# Patient Record
Sex: Male | Born: 1965
Health system: Southern US, Community
[De-identification: ages and names within clinical notes are randomized; demographics above are authoritative.]

## PROBLEM LIST (undated history)

## (undated) DIAGNOSIS — R519 Headache, unspecified: Secondary | ICD-10-CM

## (undated) DIAGNOSIS — E039 Hypothyroidism, unspecified: Secondary | ICD-10-CM

## (undated) DIAGNOSIS — G4733 Obstructive sleep apnea (adult) (pediatric): Secondary | ICD-10-CM

## (undated) DIAGNOSIS — J189 Pneumonia, unspecified organism: Secondary | ICD-10-CM

## (undated) DIAGNOSIS — N189 Chronic kidney disease, unspecified: Secondary | ICD-10-CM

## (undated) DIAGNOSIS — E0789 Other specified disorders of thyroid: Secondary | ICD-10-CM

## (undated) DIAGNOSIS — G932 Benign intracranial hypertension: Secondary | ICD-10-CM

## (undated) DIAGNOSIS — Z9989 Dependence on other enabling machines and devices: Secondary | ICD-10-CM

## (undated) DIAGNOSIS — D573 Sickle-cell trait: Secondary | ICD-10-CM

## (undated) DIAGNOSIS — H4710 Unspecified papilledema: Secondary | ICD-10-CM

## (undated) DIAGNOSIS — R55 Syncope and collapse: Principal | ICD-10-CM

## (undated) HISTORY — DX: Sickle-cell trait: D57.3

## (undated) HISTORY — DX: Pneumonia, unspecified organism: J18.9

## (undated) HISTORY — DX: Benign intracranial hypertension: G93.2

## (undated) HISTORY — DX: Dependence on other enabling machines and devices: Z99.89

## (undated) HISTORY — DX: Unspecified papilledema: H47.10

## (undated) HISTORY — DX: Syncope and collapse: R55

## (undated) HISTORY — DX: Morbid (severe) obesity due to excess calories: E66.01

## (undated) HISTORY — DX: Hypothyroidism, unspecified: E03.9

## (undated) HISTORY — DX: Obstructive sleep apnea (adult) (pediatric): G47.33

## (undated) HISTORY — DX: Other specified disorders of thyroid: E07.89

---
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
## 1997-05-27 ENCOUNTER — Emergency Department (HOSPITAL_COMMUNITY): Admission: EM | Admit: 1997-05-27 | Discharge: 1997-05-27 | Payer: Self-pay | Admitting: Emergency Medicine

## 1998-09-15 ENCOUNTER — Inpatient Hospital Stay (HOSPITAL_COMMUNITY): Admission: EM | Admit: 1998-09-15 | Discharge: 1998-09-16 | Payer: Self-pay | Admitting: Emergency Medicine

## 1998-10-03 ENCOUNTER — Encounter (INDEPENDENT_AMBULATORY_CARE_PROVIDER_SITE_OTHER): Payer: Self-pay | Admitting: Specialist

## 1998-10-03 ENCOUNTER — Other Ambulatory Visit: Admission: RE | Admit: 1998-10-03 | Discharge: 1998-10-03 | Payer: Self-pay | Admitting: Otolaryngology

## 1999-01-19 HISTORY — PX: CYST EXCISION: SHX5701

## 1999-06-25 ENCOUNTER — Encounter: Payer: Self-pay | Admitting: Emergency Medicine

## 1999-06-25 ENCOUNTER — Inpatient Hospital Stay (HOSPITAL_COMMUNITY): Admission: AD | Admit: 1999-06-25 | Discharge: 1999-06-27 | Payer: Self-pay | Admitting: Emergency Medicine

## 1999-07-13 ENCOUNTER — Ambulatory Visit (HOSPITAL_COMMUNITY): Admission: RE | Admit: 1999-07-13 | Discharge: 1999-07-13 | Payer: Self-pay | Admitting: Emergency Medicine

## 1999-08-04 ENCOUNTER — Ambulatory Visit (HOSPITAL_COMMUNITY): Admission: RE | Admit: 1999-08-04 | Discharge: 1999-08-04 | Payer: Self-pay | Admitting: Pulmonary Disease

## 1999-08-04 ENCOUNTER — Encounter: Payer: Self-pay | Admitting: Pulmonary Disease

## 1999-09-12 ENCOUNTER — Ambulatory Visit (HOSPITAL_BASED_OUTPATIENT_CLINIC_OR_DEPARTMENT_OTHER): Admission: RE | Admit: 1999-09-12 | Discharge: 1999-09-12 | Payer: Self-pay | Admitting: Pulmonary Disease

## 1999-11-27 ENCOUNTER — Encounter: Payer: Self-pay | Admitting: Emergency Medicine

## 1999-11-27 ENCOUNTER — Encounter: Admission: RE | Admit: 1999-11-27 | Discharge: 1999-11-27 | Payer: Self-pay | Admitting: Emergency Medicine

## 2001-02-13 ENCOUNTER — Ambulatory Visit (HOSPITAL_COMMUNITY): Admission: RE | Admit: 2001-02-13 | Discharge: 2001-02-13 | Payer: Self-pay | Admitting: *Deleted

## 2003-03-12 ENCOUNTER — Encounter: Admission: RE | Admit: 2003-03-12 | Discharge: 2003-03-12 | Payer: Self-pay | Admitting: Internal Medicine

## 2003-10-30 ENCOUNTER — Encounter (INDEPENDENT_AMBULATORY_CARE_PROVIDER_SITE_OTHER): Payer: Self-pay | Admitting: Specialist

## 2003-10-30 ENCOUNTER — Ambulatory Visit (HOSPITAL_COMMUNITY): Admission: RE | Admit: 2003-10-30 | Discharge: 2003-10-30 | Payer: Self-pay | Admitting: Gastroenterology

## 2003-11-04 ENCOUNTER — Ambulatory Visit (HOSPITAL_COMMUNITY): Admission: RE | Admit: 2003-11-04 | Discharge: 2003-11-04 | Payer: Self-pay | Admitting: Gastroenterology

## 2003-12-24 ENCOUNTER — Encounter: Admission: RE | Admit: 2003-12-24 | Discharge: 2003-12-24 | Payer: Self-pay | Admitting: Internal Medicine

## 2008-11-27 ENCOUNTER — Encounter: Admission: RE | Admit: 2008-11-27 | Discharge: 2008-11-27 | Payer: Self-pay | Admitting: Neurology

## 2008-12-04 ENCOUNTER — Encounter: Admission: RE | Admit: 2008-12-04 | Discharge: 2008-12-04 | Payer: Self-pay | Admitting: Neurology

## 2010-06-05 NOTE — H&P (Signed)
[place]. [facility]  Patient:    Carl Knox, Carl Knox                         MRN: [id number] Adm. Date:  06/26/99 Attending:  Reuben Likes, M.D. Dictator:   Reuben Likes, M.D.                         History and Physical  CHIEF COMPLAINT:  "Right chest pain".  HISTORY OF PRESENT ILLNESS:  The patient is a 45 year old male who has a two week history of pleuritic right lateral chest pain which radiates to the right lower chest anteriorly and posteriorly.  When the symptoms first began the patient had a temperature of 103, but none since then.  He denies cough, sputum production, dyspnea, nausea, or vomiting.  A chest x-ray the day before admission in our office showed a small area of atelectasis versus pneumonia at the right base.  He was given Claforan 1 g IM plus Levaquin 500 mg p.o. once a day, but today feels worse, and is admitted for further evaluation and treatment.  PAST SURGICAL HISTORY:  Excision of a thyroglossal duct cyst in September 2000.  OTHER HOSPITALIZATIONS:  None.  OTHER ILLNESSES:  Obstructive sleep apnea and uses a CPAP machine.  This was diagnosed in 1999.  He had pneumonia in 1999.  MEDICATIONS:  As above.  ALLERGIES:  No known drug allergies.  FAMILY HISTORY:  His mother has hypertension and heart problems.  His father is alive and well.  He has a brother who is alive and well, and two children, one of whom has sickle cell trait.  SOCIAL HISTORY:  He is married.  He works as a Surveyor, quantity.  He does not use alcohol or tobacco.  REVIEW OF SYSTEMS:  No other systemic, skin, eye, ENT, respiratory, cardiovascular, gastrointestinal, GU, musculoskeletal, neurological, or psychiatric complaints.  PHYSICAL EXAMINATION:  VITAL SIGNS:  Blood pressure 178/71, pulse 77 and regular, respirations 20, temperature 99.0.  GENERAL:  Alert and in no distress.  SKIN:  Warm and dry, no rash.  EYES:  Pupils are equal,  round and reactive to light.  Full EOMs.  Fundi benign.  Sclerae nonicteric.  ENT:  Tympanic membranes normal.  No intraoral lesions.  Mucus membranes moist.  Pharynx clear.  NECK:  Supple, no adenopathy, JVD, or bruit.  LUNGS:  There were decreased breath sounds of the right base, no rales.  Lungs were resonant to percussion.  HEART:  Regular rhythm, no murmurs, rubs, or gallops.  ABDOMEN:  The patient has moderate right upper quadrant tenderness to palpation without guarding or rebound.  No hepatosplenomegaly or mass.  Bowel sounds were diminished.  He has a positive Murphys sign, but negative Murphys punch.  EXTREMITIES:  No calf tenderness, Homans sign negative, no edema, pulses full.  NEUROLOGIC:  Alert and oriented x 3.  Speech is clear and appropriate.  No extremity weakness or tremor.  Deep tendon reflexes 2+ and symmetrical. Babinski is downgoing.  Cranial nerves intact.  ADMITTING IMPRESSION:  Pleuritic right chest pain, possibly pneumonia, pulmonary embolus, or gallstones.  PLAN: 1. Repeat chest x-ray, spiral chest CT, and gallbladder ultrasound. 2. Continue intravenous Levaquin. DD:  06/26/99 TD:  06/26/99 Job: [id number]

## 2010-06-05 NOTE — Op Note (Signed)
NAME:  Carl Knox, Carl Knox NO.:  [id number]   MEDICAL RECORD NO.:  [id number]          PATIENT TYPE:  AMB   LOCATION:  ENDO                         FACILITY:  [facility]   PHYSICIAN:  Anselmo Rod, M.D.  DATE OF BIRTH:  1965-10-31   DATE OF PROCEDURE:  10/30/2003  DATE OF DISCHARGE:                                 OPERATIVE REPORT   PROCEDURE PERFORMED:  Esophagogastroduodenoscopy with antral biopsies.   ENDOSCOPIST:  Anselmo Rod, M.D.   INSTRUMENT USED:  Olympus video panendoscope.   INDICATION FOR PROCEDURE:  A 45 year old African-American male with a  history of reflux, epigastric discomfort, wheezing, and asthma.  Rule out  peptic esophagitis, stricture, ulcer disease, etc.   PREPROCEDURE PREPARATION:  Informed consent was procured from the patient.  The patient had fasted for eight hours prior to the procedure.   PREPROCEDURE PHYSICAL:  VITAL SIGNS:  The patient had stable vital signs.  NECK:  Supple.  CHEST:  Clear to auscultation.  S1, S2 regular.  ABDOMEN:  Soft with normal bowel sounds.  The patient has a history of  obstructive sleep apnea.   DESCRIPTION OF PROCEDURE:  The patient was placed in the left lateral  decubitus position and sedated with 75 mg of Demerol and 7 mg of Versed in  slow incremental doses.  Once the patient was adequately sedate and  maintained on low-flow oxygen and continuous cardiac monitoring, the Olympus  video panendoscope was advanced through the mouthpiece, over the tongue, and  into the esophagus under direct vision.  The proximal esophagus appeared  normal.  There was grade 1 distal esophagitis around the Z-line.  The scope  was then advanced in the stomach.  There was moderate diffuse gastritis  throughout the gastric mucosa.  Biopsies were done to rule out presence of  H. pylori by pathology.  Retroflexion in the high cardia revealed no  abnormalities.  No ulcers, erosions, masses, or polyps were seen, and the  proximal small bowel appeared normal.  There was no outlet obstruction.  The  patient tolerated the procedure well without immediate complications.   IMPRESSION:  1.  Grade 1 distal esophagitis.  2.  Diffuse gastritis, biopsies done for Helicobacter pylori by pathology.  3.  Normal proximal small bowel.  4.  No ulcers, erosions, masses, or polyps seen.   RECOMMENDATIONS:  1.  Await pathology results.  2.  Avoid nonsteroidals including aspirin for now.  3.  Continue PPIs.  4.  Outpatient follow-up in the next two weeks for further recommendations.       JNM/MEDQ  D:  10/30/2003  T:  10/30/2003  Job:  [id number]   cc:   Olene Craven, M.D.  [address]  [address]  [place]  [place] [address]  Fax: [phone]   Marcelyn Bruins, M.D. [facility]   Leslye Peer Gerilyn Pilgrim, MD  [phone] [address] [place]  [place] [address]  Fax: [phone]

## 2012-12-25 ENCOUNTER — Ambulatory Visit: Payer: Self-pay | Admitting: Dietician

## 2013-02-02 ENCOUNTER — Ambulatory Visit: Payer: Self-pay | Admitting: Dietician

## 2015-03-20 DIAGNOSIS — J3489 Other specified disorders of nose and nasal sinuses: Secondary | ICD-10-CM | POA: Diagnosis not present

## 2015-03-20 MED FILL — MUPIROCIN 2% OINTMENT: 2 | 10 days supply | Qty: 22 | Fill #0

## 2015-06-29 DIAGNOSIS — M25571 Pain in right ankle and joints of right foot: Secondary | ICD-10-CM | POA: Diagnosis not present

## 2015-06-29 DIAGNOSIS — S90122A Contusion of left lesser toe(s) without damage to nail, initial encounter: Secondary | ICD-10-CM | POA: Diagnosis not present

## 2015-06-29 DIAGNOSIS — M79676 Pain in unspecified toe(s): Secondary | ICD-10-CM | POA: Diagnosis not present

## 2015-06-29 DIAGNOSIS — W108XXA Fall (on) (from) other stairs and steps, initial encounter: Secondary | ICD-10-CM | POA: Diagnosis not present

## 2015-06-29 DIAGNOSIS — S93401A Sprain of unspecified ligament of right ankle, initial encounter: Secondary | ICD-10-CM | POA: Diagnosis not present

## 2015-07-01 DIAGNOSIS — S93491A Sprain of other ligament of right ankle, initial encounter: Secondary | ICD-10-CM | POA: Diagnosis not present

## 2015-07-08 ENCOUNTER — Encounter: Payer: Self-pay | Admitting: Physical Therapy

## 2015-07-08 ENCOUNTER — Ambulatory Visit: Payer: 59 | Attending: Orthopedic Surgery | Admitting: Physical Therapy

## 2015-07-08 DIAGNOSIS — R600 Localized edema: Secondary | ICD-10-CM | POA: Insufficient documentation

## 2015-07-08 DIAGNOSIS — M6281 Muscle weakness (generalized): Secondary | ICD-10-CM | POA: Diagnosis not present

## 2015-07-08 DIAGNOSIS — R262 Difficulty in walking, not elsewhere classified: Secondary | ICD-10-CM | POA: Diagnosis not present

## 2015-07-08 DIAGNOSIS — M25671 Stiffness of right ankle, not elsewhere classified: Secondary | ICD-10-CM | POA: Diagnosis not present

## 2015-07-08 DIAGNOSIS — M25571 Pain in right ankle and joints of right foot: Secondary | ICD-10-CM | POA: Diagnosis not present

## 2015-07-08 NOTE — Therapy (Signed)
[place], [place], [address] Phone: [phone]   Fax:  [phone]  Physical Therapy Evaluation  Patient Details  Name: Carl Knox MRN: [id number] Date of Birth: [birth year] Referring Provider: Dr Elsie Saas   Encounter Date: 07/08/2015      PT End of Session - 07/08/15 1122    Visit Number 1   Number of Visits 16   Date for PT Re-Evaluation 09/02/15   Authorization Type [facility]    PT Start Time 0800   PT Stop Time 0853   PT Time Calculation (min) 53 min   Activity Tolerance Patient tolerated treatment well   Behavior During Therapy [facility] for tasks assessed/performed      History reviewed. No pertinent past medical history.  History reviewed. No pertinent past surgical history.  There were no vitals filed for this visit.       Subjective Assessment - 07/08/15 0809    Subjective Patient was walking down the stairs on 06/29/15 when he fell and sprained his right ankle. He also badly bruised his big toe on the left foot.    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    Limitations Walking;Standing   How long can you sit comfortably? N/A    How long can you stand comfortably? > 20 minutes    How long can you walk comfortably? Household distances    Diagnostic tests Nothing in the system: perpatient x-rays (-)    Patient Stated Goals To get back to work    Currently in Pain? Yes   Pain Score 2    Pain Location Ankle   Pain Descriptors / Indicators Aching   Pain Onset 1 to 4 weeks ago   Pain Frequency Intermittent   Aggravating Factors  walking    Pain Relieving Factors rest and ice    Effect of Pain on Daily Activities difficulty walking/ unable to work    Multiple Pain Sites No            [facility] PT Assessment - 07/08/15 0001    Assessment   Medical Diagnosis right ankle sprain    Referring Provider Dr Elsie Saas    Onset  Date/Surgical Date 06/29/15   Hand Dominance Right   Next MD Visit 07/11/2015   Prior Therapy No   Precautions   Precautions None   Required Braces or Orthoses --  Cam walker boot    Restrictions   Weight Bearing Restrictions No   Balance Screen   Has the patient fallen in the past 6 months Yes   How many times? 1  only fall the patient has had   Home Environment   Additional Comments 16 steps to get in the house.    Prior Function   Level of Independence Independent   Cognition   Overall Cognitive Status Within Functional Limits for tasks assessed   Observation/Other Assessments   Observations Bilateral flat foot   Observation/Other Assessments-Edema    Edema Figure 8   Figure 8 Edema   Figure 8 - Right  63.5   Figure 8 - Left  61.6   Sensation   Light Touch Appears Intact   ROM / Strength   AROM / PROM / Strength AROM;PROM;Strength   AROM   AROM Assessment Site Ankle   Right/Left Ankle Left;Right   Right Ankle Dorsiflexion -8   Right Ankle Plantar Flexion 36   Right Ankle Inversion 20  Right Ankle Eversion 8   PROM   PROM Assessment Site Ankle   Right/Left Ankle Left;Right   Right Ankle Dorsiflexion -4   Right Ankle Plantar Flexion 40   Right Ankle Inversion 28   Right Ankle Eversion 13   Left Ankle Dorsiflexion --  WFL   Left Ankle Plantar Flexion --  WFL   Left Ankle Inversion --  WFL   Left Ankle Eversion --  [facility]   Strength   Strength Assessment Site Ankle   Right/Left Ankle Left;Right   Right Ankle Dorsiflexion 4+/5   Right Ankle Plantar Flexion 3/5   Right Ankle Inversion 5/5   Right Ankle Eversion 4+/5   Palpation   Palpation comment Tenderness to aplapation around the ATFL and the distal lateral achillies insertion    Ambulation/Gait   Gait Comments Ambualtes with the boot on. Decreased single leg stance on the right.                    [facility] Adult PT Treatment/Exercise - 07/08/15 0001    Self-Care   Self-Care Other Self-Care  Comments   Other Self-Care Comments  Educated the patient on symptom management and activity progression at home.    Ankle Exercises: Stretches   Gastroc Stretch Limitations seated with strap 3x30sec hold    Ankle Exercises: Seated   Other Seated Ankle Exercises Ankle 4 way t-band 20x w/ green    Ankle Exercises: Standing   Other Standing Ankle Exercises Standing weight shift 2x10 each way                PT Education - 07/08/15 1121    Education provided Yes   Education Details Patient educated exercise and increasing stability. Patient given HEP. on the improtance of    Person(s) Educated Patient   Methods Explanation;Demonstration   Comprehension Verbalized understanding;Returned demonstration          PT Short Term Goals - 07/08/15 1723    PT SHORT TERM GOAL #1   Title Patient will be I w/ HEp for ankle strength and ROM    Time 4   Period Weeks   Status New   PT SHORT TERM GOAL #2   Title Patient will ambulate 400' without the boot and without pain    Time 4   Period Weeks   Status New   PT SHORT TERM GOAL #3   Title Patient will increase gross left ankle strength to 5/5    Time 4   Period Weeks   Status New   PT SHORT TERM GOAL #4   Title Patient will increase passive dorsiflexion by 10 degrees    Time 4   Period Weeks   Status New           PT Long Term Goals - 07/08/15 1726    PT LONG TERM GOAL #1   Title Patient will increase active dorsiflexion to 15 degrees in order to go up and down stairs comfortably    Time 8   Period Weeks   Status New   PT LONG TERM GOAL #2   Title Patient will ambulate 3000' without pain in order to return to work    Time 8   Period Weeks   Status New   PT LONG TERM GOAL #3   Title Patient will stand for 3 hours without self report of pain in order to return to work.    Time 8   Period Weeks   Status New  Plan - 07/08/15 1717    Clinical Impression Statement Patient is a 50 year old male  S/P left ankle sprain and right toe bruise. He presents with decreased strength and range of motion of the left ankle. 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 He is currently in a boot. He has pain but it is well controlled at this time. He was seen for a low complexity evaluation today. He would benefit from skilled therapy to improve ankle strength and mobility.    Rehab Potential Good   PT Frequency 2x / week   PT Duration 8 weeks   PT Treatment/Interventions ADLs/Self Care Home Management;Cryotherapy;Electrical Stimulation;Therapeutic activities;Iontophoresis 4mg /ml Dexamethasone;Moist Heat;Therapeutic exercise;Functional mobility training;Stair training;Gait training;Ultrasound;Neuromuscular re-education;Patient/family education;Manual techniques;Dry needling;Energy conservation;Taping;Passive range of motion;Compression bandaging;Manual lymph drainage;Visual/perceptual remediation/compensation   PT Next Visit Plan continue manual therapy to increase ankle range of motion. continue standing activity; consider baps board, cybex leg press, rocker board sitting or standing,    PT Home Exercise Plan ankle 4 way t-band, gastroc stretch, standing weight shift forward and back    Consulted and Agree with Plan of Care Patient      Patient will benefit from skilled therapeutic intervention in order to improve the following deficits and impairments:  Abnormal gait, Decreased activity tolerance, Decreased mobility, Decreased strength, Decreased endurance, Difficulty walking, Decreased range of motion, Decreased safety awareness, Impaired flexibility  Visit Diagnosis: Pain in right ankle and joints of right foot - Plan: PT plan of care cert/re-cert  Stiffness of right ankle, not elsewhere classified - Plan: PT plan of care cert/re-cert  Difficulty in walking, not elsewhere classified - Plan: PT plan of care cert/re-cert  Muscle weakness (generalized) - Plan: PT plan  of care cert/re-cert  Localized edema - Plan: PT plan of care cert/re-cert     Problem List There are no active problems to display for this patient.   Carney Living PT DPT  07/08/2015, 5:35 PM  [facility] [address] [place], [place], [address] Phone: [phone]   Fax:  [phone]  Name: Carl Knox MRN: [id number] Date of Birth: 10-20-1965

## 2015-07-10 ENCOUNTER — Encounter: Payer: Self-pay | Admitting: Podiatry

## 2015-07-10 ENCOUNTER — Ambulatory Visit (INDEPENDENT_AMBULATORY_CARE_PROVIDER_SITE_OTHER): Payer: 59 | Admitting: Podiatry

## 2015-07-10 VITALS — BP 124/75 | HR 71 | Resp 14

## 2015-07-10 DIAGNOSIS — W450XXA Nail entering through skin, initial encounter: Secondary | ICD-10-CM

## 2015-07-10 DIAGNOSIS — M79676 Pain in unspecified toe(s): Secondary | ICD-10-CM | POA: Diagnosis not present

## 2015-07-10 DIAGNOSIS — S93491D Sprain of other ligament of right ankle, subsequent encounter: Secondary | ICD-10-CM | POA: Diagnosis not present

## 2015-07-10 DIAGNOSIS — M25562 Pain in left knee: Secondary | ICD-10-CM | POA: Diagnosis not present

## 2015-07-10 NOTE — Progress Notes (Signed)
   Subjective:    Patient ID: Carl Knox, male    DOB: 17-Nov-1965, 50 y.o.   MRN: [id number]  HPI this patient presents the office with chief complaint of drainage coming from the outside of his his big toenail, left foot. He said he fell down a flight of stairs on 06/29/2015. He says the outside of the big toe left foot then became numb. He was over at the orthopedic doctors office this morning for a broken foot and was referred to this office for an examination of the drainage of the outside border left big toe. Patient states there is clear drainage but he is not having much pain or discomfort at this point, he presents the office today for an evaluation and treatment of his left big toenail  Review of Systems  Neurological: Positive for weakness.  All other systems reviewed and are negative.      Objective:   Physical Exam GENERAL APPEARANCE: Alert, conversant. Appropriately groomed. No acute distress.  VASCULAR: Pedal pulses are  palpable at  [facility] and PT bilateral.  Capillary refill time is immediate to all digits,  Normal temperature gradient.  Digital hair growth is present bilateral  NEUROLOGIC: sensation is normal to 5.07 monofilament at 5/5 sites bilateral.  Light touch is intact bilateral, Muscle strength normal.  MUSCULOSKELETAL: acceptable muscle strength, tone and stability bilateral.  Intrinsic muscluature intact bilateral.  Rectus appearance of foot and digits noted bilateral.   DERMATOLOGIC: skin color, texture, and turgor are within normal limits.  No preulcerative lesions or ulcers  are seen, no interdigital maceration noted.  No open lesions present.   NAIL  There is broken nail along the lateral border left great toe.  Removal of nail reveals clear fluid running from under left big toenail. Motion is detected at proximal nail fold left hallux.          Assessment & Plan:  Nail Injury left hallux.  IE  Incision and drainage lateral border left hallux.  Neosporin/DSD.   Home soaks given.  Recommended we allow the toenail declare itself.  RTC prn   Gardiner Barefoot DPM

## 2015-07-15 ENCOUNTER — Ambulatory Visit: Payer: 59 | Admitting: Physical Therapy

## 2015-07-15 DIAGNOSIS — R600 Localized edema: Secondary | ICD-10-CM | POA: Diagnosis not present

## 2015-07-15 DIAGNOSIS — R262 Difficulty in walking, not elsewhere classified: Secondary | ICD-10-CM | POA: Diagnosis not present

## 2015-07-15 DIAGNOSIS — M6281 Muscle weakness (generalized): Secondary | ICD-10-CM

## 2015-07-15 DIAGNOSIS — M25571 Pain in right ankle and joints of right foot: Secondary | ICD-10-CM

## 2015-07-15 DIAGNOSIS — M25671 Stiffness of right ankle, not elsewhere classified: Secondary | ICD-10-CM

## 2015-07-15 NOTE — Therapy (Addendum)
[place], [place], [address] Phone: [phone]   Fax:  [phone]  Physical Therapy Treatment  Patient Details  Name: Carl Knox MRN: [id number] Date of Birth: 10/19/1965 Referring Provider: Dr Elsie Saas   Encounter Date: 07/15/2015      PT End of Session - 07/15/15 0936    Visit Number 2   Number of Visits 16   Date for PT Re-Evaluation 09/02/15   Authorization Type [facility]    PT Start Time 0930   PT Stop Time 1015   PT Time Calculation (min) 45 min      No past medical history on file.  No past surgical history on file.  There were no vitals filed for this visit.      Subjective Assessment - 07/15/15 0931    Subjective Sore after I walk or stand on it awhile cooking for an hour.    Currently in Pain? No/denies            [facility] PT Assessment - 07/15/15 0001    AROM   Right Ankle Dorsiflexion 0   Right Ankle Plantar Flexion 50   Right Ankle Inversion 22   Right Ankle Eversion 18                     [facility] Adult PT Treatment/Exercise - 07/15/15 0001    Ankle Exercises: Seated   BAPS Level 2   Other Seated Ankle Exercises Ankle 4 way t-band 20x w/ green    Ankle Exercises: Stretches   Gastroc Stretch Limitations seated with strap 3x30sec hold    Ankle Exercises: Standing   SLS 5 sec best, used finger touch 30 sec x 2   Heel Raises 10 reps   Toe Raise 10 reps     VASO PNEUMATIC DEVICE LOW PRESSURE 32 DEGREES  RIGHT ANKLE ELEVATED x15 MINUTES 32 DEGREES FOR EDEMA             PT Short Term Goals - 07/08/15 1723    PT SHORT TERM GOAL #1   Title Patient will be I w/ HEp for ankle strength and ROM    Time 4   Period Weeks   Status New   PT SHORT TERM GOAL #2   Title Patient will ambulate 400' without the boot and without pain    Time 4   Period Weeks   Status New   PT SHORT TERM GOAL #3   Title Patient will increase gross left ankle strength to 5/5     Time 4   Period Weeks   Status New   PT SHORT TERM GOAL #4   Title Patient will increase passive dorsiflexion by 10 degrees    Time 4   Period Weeks   Status New           PT Long Term Goals - 07/08/15 1726    PT LONG TERM GOAL #1   Title Patient will increase active dorsiflexion to 15 degrees in order to go up and down stairs comfortably    Time 8   Period Weeks   Status New   PT LONG TERM GOAL #2   Title Patient will ambulate 3000' without pain in order to return to work    Time 8   Period Weeks   Status New   PT LONG TERM GOAL #3   Title Patient will stand for 3 hours without self report of pain in order to return  to work.    Time 8   Period Weeks   Status New               Plan - 07/15/15 0943    Clinical Impression Statement Pt reports he does not wear his boot around the house. He has been given ASO however he does not own any sneakers. He wears no shoes around the house and has been perfroming weight shift and heel raises per HEP without much difficulty. He reports his HEP causes some increased pain. Review of HEP- independent. Added SLS with light UE support to HEP. Encouraged pt not to over do this at home and monitor his edema. Trial of vasopneumatic for edema today. All AROM motions improved.    PT Next Visit Plan continue manual therapy( achilles, calf, lateral ankle ) to increase ankle range of motion. continue standing activity; consider baps board, cybex leg press, rocker board sitting or standing,       Patient will benefit from skilled therapeutic intervention in order to improve the following deficits and impairments:  Abnormal gait, Decreased activity tolerance, Decreased mobility, Decreased strength, Decreased endurance, Difficulty walking, Decreased range of motion, Decreased safety awareness, Impaired flexibility  Visit Diagnosis: Pain in right ankle and joints of right foot  Stiffness of right ankle, not elsewhere classified  Difficulty  in walking, not elsewhere classified  Localized edema  Muscle weakness (generalized)     Problem List There are no active problems to display for this patient.   Hessie Diener [place] , [place]  07/15/2015, 10:24 AM  [place], [place], [address] Phone: [phone]   Fax:  [phone]  Name: Lafayette Meech MRN: [id number] Date of Birth: [birth year]

## 2015-07-18 ENCOUNTER — Ambulatory Visit: Payer: 59 | Admitting: Physical Therapy

## 2015-07-18 DIAGNOSIS — M25571 Pain in right ankle and joints of right foot: Secondary | ICD-10-CM

## 2015-07-18 DIAGNOSIS — M25671 Stiffness of right ankle, not elsewhere classified: Secondary | ICD-10-CM

## 2015-07-18 DIAGNOSIS — R262 Difficulty in walking, not elsewhere classified: Secondary | ICD-10-CM

## 2015-07-18 DIAGNOSIS — R600 Localized edema: Secondary | ICD-10-CM

## 2015-07-18 DIAGNOSIS — M6281 Muscle weakness (generalized): Secondary | ICD-10-CM

## 2015-07-18 NOTE — Therapy (Signed)
[place], [place], [address] Phone: [phone]   Fax:  [phone]  Physical Therapy Treatment  Patient Details  Name: Carl Knox MRN: [id number] Date of Birth: Dec 09, 1965 Referring Provider: Dr Elsie Saas   Encounter Date: 07/18/2015      PT End of Session - 07/18/15 0933    Visit Number 3   Number of Visits 16   Date for PT Re-Evaluation 09/02/15   Authorization Type [facility]    PT Start Time 0930   PT Stop Time 1030   PT Time Calculation (min) 60 min      No past medical history on file.  No past surgical history on file.  There were no vitals filed for this visit.      Subjective Assessment - 07/18/15 0938    Subjective The brace seems to make it swell more.   Currently in Pain? Yes   Pain Score 4    Pain Location Ankle   Pain Orientation Right   Pain Descriptors / Indicators --  piercing    Aggravating Factors  walking, stairs    Pain Relieving Factors rest ice,                          [facility] Adult PT Treatment/Exercise - 07/18/15 0001    Ambulation/Gait   Gait Comments Ambulating in sneakers with ASO   Modalities   Modalities Vasopneumatic   Vasopneumatic   Number Minutes Vasopneumatic  15 minutes   Vasopnuematic Location  Ankle   Vasopneumatic Pressure Medium   Vasopneumatic Temperature  32   Manual Therapy   Manual Therapy Soft tissue mobilization;Passive ROM   Soft tissue mobilization right achilles, gastroc, tender at muscle tendon junction   Passive ROM DF   Ankle Exercises: Standing   SLS 48 seconds best   Rocker Board 1 minute   Heel Raises 10 reps   Toe Raise 10 reps   Ankle Exercises: Seated   BAPS Level 2   Ankle Exercises: Stretches   Gastroc Stretch 1 rep;60 seconds  runners stretch, also hang of step 1 rep each   Slant Board Stretch 30 seconds;3 reps                  PT Short Term Goals - 07/18/15 0936    PT SHORT TERM  GOAL #1   Title Patient will be I w/ HEp for ankle strength and ROM    Time 4   Period Weeks   PT SHORT TERM GOAL #2   Title Patient will ambulate 400' without the boot and without pain    Time 4   Period Weeks   PT SHORT TERM GOAL #3   Title Patient will increase gross left ankle strength to 5/5    Time 4   Period Weeks   Status On-going   PT SHORT TERM GOAL #4   Title Patient will increase passive dorsiflexion by 10 degrees    Time 4   Period Weeks   Status On-going           PT Long Term Goals - 07/08/15 1726    PT LONG TERM GOAL #1   Title Patient will increase active dorsiflexion to 15 degrees in order to go up and down stairs comfortably    Time 8   Period Weeks   Status New   PT LONG TERM GOAL #2   Title Patient will ambulate 3000'  without pain in order to return to work    Time 8   Period Weeks   Status New   PT LONG TERM GOAL #3   Title Patient will stand for 3 hours without self report of pain in order to return to work.    Time 8   Period Weeks   Status New               Plan - 07/18/15 1034    Clinical Impression Statement Pt reports increased edema he attributes to the ASO. He is wearing sneakers today. He is only wearing ASO when he leaves home. He has been working on [employer] with UE support. He is able to perfrom SLS without UE support  for 48 seconds. He has limited DF ROM. Instructed him in varous calf stretches for HEP. Also performed STW to right achilles to gastroc with PROM. Several tender areas notes. Vaso repeated for ankle edema. Pt reports mild increase in pain with todays therex. He reports he has a tolerance. Encourged him to not over do his exercises.    PT Next Visit Plan continue manual therapy( achilles, calf, lateral ankle ) to increase ankle range of motion. continue standing activity; consider baps board, cybex leg press, rocker board sitting or standing,       Patient will benefit from skilled therapeutic intervention in order to  improve the following deficits and impairments:  Abnormal gait, Decreased activity tolerance, Decreased mobility, Decreased strength, Decreased endurance, Difficulty walking, Decreased range of motion, Decreased safety awareness, Impaired flexibility  Visit Diagnosis: Pain in right ankle and joints of right foot  Stiffness of right ankle, not elsewhere classified  Difficulty in walking, not elsewhere classified  Localized edema  Muscle weakness (generalized)     Problem List There are no active problems to display for this patient.   Dorene Ar, [place] 07/18/2015, 10:52 AM  [place], [place], [address] Phone: [phone]   Fax:  [phone]  Name: Clabon Luria MRN: [id number] Date of Birth: [birth year]

## 2015-07-23 ENCOUNTER — Ambulatory Visit: Payer: 59 | Attending: Orthopedic Surgery | Admitting: Physical Therapy

## 2015-07-23 DIAGNOSIS — R262 Difficulty in walking, not elsewhere classified: Secondary | ICD-10-CM | POA: Insufficient documentation

## 2015-07-23 DIAGNOSIS — M25671 Stiffness of right ankle, not elsewhere classified: Secondary | ICD-10-CM | POA: Diagnosis not present

## 2015-07-23 DIAGNOSIS — M6281 Muscle weakness (generalized): Secondary | ICD-10-CM | POA: Diagnosis not present

## 2015-07-23 DIAGNOSIS — M25571 Pain in right ankle and joints of right foot: Secondary | ICD-10-CM | POA: Insufficient documentation

## 2015-07-23 DIAGNOSIS — R600 Localized edema: Secondary | ICD-10-CM | POA: Insufficient documentation

## 2015-07-23 NOTE — Therapy (Signed)
[place], [place], [address] Phone: [phone]   Fax:  [phone]  Physical Therapy Treatment  Patient Details  Name: Carl Knox MRN: [id number] Date of Birth: [birth year] Referring Provider: Dr Elsie Saas   Encounter Date: 07/23/2015      PT End of Session - 07/23/15 1401    Visit Number 4   Number of Visits 16   Date for PT Re-Evaluation 09/02/15   Authorization Type [facility]    PT Start Time 1330   PT Stop Time 1425   PT Time Calculation (min) 55 min   Activity Tolerance Patient tolerated treatment well   Behavior During Therapy [facility] for tasks assessed/performed      No past medical history on file.  No past surgical history on file.  There were no vitals filed for this visit.      Subjective Assessment - 07/23/15 1433    Subjective Patient reports t has not been too bad.    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    Limitations Walking;Standing   How long can you sit comfortably? N/A    How long can you stand comfortably? > 20 minutes    How long can you walk comfortably? Household distances    Diagnostic tests Nothing in the system: perpatient x-rays (-)    Patient Stated Goals To get back to work    Currently in Pain? Yes   Pain Score 3    Pain Location Ankle   Pain Orientation Right   Pain Descriptors / Indicators Aching   Pain Type Acute pain   Pain Onset 1 to 4 weeks ago   Pain Frequency Intermittent   Aggravating Factors  walking/ stairs    Pain Relieving Factors rest, ice    Effect of Pain on Daily Activities difficulty walking    Multiple Pain Sites No                         [facility] Adult PT Treatment/Exercise - 07/23/15 0001    Modalities   Modalities Vasopneumatic   Vasopneumatic   Number Minutes Vasopneumatic  15 minutes   Vasopnuematic Location  Ankle   Vasopneumatic Pressure Medium   Vasopneumatic Temperature  32   Manual Therapy   Manual Therapy Soft tissue mobilization;Passive ROM   Soft tissue mobilization right achilles, gastroc, tender at muscle tendon junction   Passive ROM DF   Ankle Exercises: Seated   BAPS Level 2   Other Seated Ankle Exercises Ankle 4 way t-band 20x w/ green    Ankle Exercises: Standing   SLS 4x20sec hold    Rocker Board 1 minute   Heel Raises 20 reps   Toe Raise 20 reps   Other Standing Ankle Exercises foward step up 6" 2x10; mini squatx6 had to stop 2nd to left knee pain; standing marchi on air-ex x20; step onto air-rex with left 2x10;   Ankle Exercises: Stretches   Press photographer --  runners stretch, also hang of step 1 rep each                PT Education - 07/23/15 1421    Education provided Yes   Education Details Patient advised to hold on squats at this time. He was educated on the reasoning of exercises.    Person(s) Educated Patient   Methods Explanation;Demonstration   Comprehension Verbalized understanding;Returned demonstration  PT Short Term Goals - 07/23/15 1438    PT SHORT TERM GOAL #1   Title Patient will be I w/ HEp for ankle strength and ROM    Time 4   Period Weeks   Status On-going   PT SHORT TERM GOAL #2   Title Patient will ambulate 400' without the boot and without pain    Baseline slight pain    Time 4   Period Weeks   Status Partially Met   PT SHORT TERM GOAL #3   Title Patient will increase gross left ankle strength to 5/5    Period Weeks   Status On-going   PT SHORT TERM GOAL #4   Title Patient will increase passive dorsiflexion by 10 degrees    Baseline 9 degrees    Time 4   Period Weeks   Status On-going           PT Long Term Goals - 07/08/15 1726    PT LONG TERM GOAL #1   Title Patient will increase active dorsiflexion to 15 degrees in order to go up and down stairs comfortably    Time 8   Period Weeks   Status New   PT LONG TERM GOAL #2   Title Patient  will ambulate 3000' without pain in order to return to work    Time 8   Period Weeks   Status New   PT LONG TERM GOAL #3   Title Patient will stand for 3 hours without self report of pain in order to return to work.    Time 8   Period Weeks   Status New               Plan - 07/23/15 1404    Clinical Impression Statement Patient had some pain with squating in the left knee. He reports this has been a problem but the hope was when he came out of the boot the knee would improve. He reports his knee has been abouyt the same. He has been going up the stairs with the right leg first because of the knee. The patients ankle is imprving. his single leg stance has imporved. His dorsi gflexion was measured at 9 degrees without pain. He continues to have some tenderness around his  achillies insertion.    PT Frequency 2x / week   PT Duration 8 weeks   PT Treatment/Interventions ADLs/Self Care Home Management;Cryotherapy;Electrical Stimulation;Therapeutic activities;Iontophoresis 47m/ml Dexamethasone;Moist Heat;Therapeutic exercise;Functional mobility training;Stair training;Gait training;Ultrasound;Neuromuscular re-education;Patient/family education;Manual techniques;Dry needling;Energy conservation;Taping;Passive range of motion;Compression bandaging;Manual lymph drainage;Visual/perceptual remediation/compensation   PT Next Visit Plan continue manual therapy( achilles, calf, lateral ankle ) to increase ankle range of motion. continue standing activity; consider baps board, cybex leg press, rocker board sitting or standing, Contiinue with functional strength training as tolerated.    PT Home Exercise Plan ankle 4 way t-band, gastroc stretch, standing weight shift forward and back    Consulted and Agree with Plan of Care Patient      Patient will benefit from skilled therapeutic intervention in order to improve the following deficits and impairments:  Abnormal gait, Decreased activity tolerance,  Decreased mobility, Decreased strength, Decreased endurance, Difficulty walking, Decreased range of motion, Decreased safety awareness, Impaired flexibility  Visit Diagnosis: Pain in right ankle and joints of right foot  Stiffness of right ankle, not elsewhere classified  Difficulty in walking, not elsewhere classified  Localized edema  Muscle weakness (generalized)     Problem List There are no active problems to  display for this patient.   Carney Living PT DPT  07/23/2015, 2:48 PM  [facility] [address] [place], [place], [address] Phone: [phone]   Fax:  [phone]  Name: Carl Knox MRN: [id number] Date of Birth: February 08, 1965

## 2015-07-23 NOTE — Therapy (Deleted)
[place], [place], [address] Phone: [phone]   Fax:  [phone]  Physical Therapy Treatment  Patient Details  Name: Carl Knox MRN: [id number] Date of Birth: [birth year] Referring Provider: Dr Elsie Saas   Encounter Date: 07/23/2015      PT End of Session - 07/23/15 1401    Visit Number 4   Number of Visits 16   Date for PT Re-Evaluation 09/02/15   Authorization Type [facility]    PT Start Time 1330   PT Stop Time 1425   PT Time Calculation (min) 55 min   Activity Tolerance Patient tolerated treatment well   Behavior During Therapy [facility] for tasks assessed/performed      No past medical history on file.  No past surgical history on file.  There were no vitals filed for this visit.      Subjective Assessment - 07/23/15 1433    Subjective Patient reports t has not been too bad.    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    Limitations Walking;Standing   How long can you sit comfortably? N/A    How long can you stand comfortably? > 20 minutes    How long can you walk comfortably? Household distances    Diagnostic tests Nothing in the system: perpatient x-rays (-)    Patient Stated Goals To get back to work    Currently in Pain? Yes   Pain Score 3    Pain Location Ankle   Pain Orientation Right   Pain Descriptors / Indicators Aching   Pain Type Acute pain   Pain Onset 1 to 4 weeks ago   Pain Frequency Intermittent   Aggravating Factors  walking/ stairs    Pain Relieving Factors rest, ice    Effect of Pain on Daily Activities difficulty walking    Multiple Pain Sites No                         [facility] Adult PT Treatment/Exercise - 07/23/15 0001    Modalities   Modalities Vasopneumatic   Vasopneumatic   Number Minutes Vasopneumatic  15 minutes   Vasopnuematic Location  Ankle   Vasopneumatic Pressure Medium   Vasopneumatic Temperature  32   Manual Therapy   Manual Therapy Soft tissue mobilization;Passive ROM   Soft tissue mobilization right achilles, gastroc, tender at muscle tendon junction   Passive ROM DF   Ankle Exercises: Seated   BAPS Level 2   Other Seated Ankle Exercises Ankle 4 way t-band 20x w/ green    Ankle Exercises: Standing   SLS 4x20sec hold    Rocker Board 1 minute   Heel Raises 20 reps   Toe Raise 20 reps   Other Standing Ankle Exercises foward step up 6" 2x10; mini squatx6 had to stop 2nd to left knee pain; standing marchi on air-ex x20; step onto air-rex with left 2x10;   Ankle Exercises: Stretches   Press photographer --  runners stretch, also hang of step 1 rep each                PT Education - 07/23/15 1421    Education provided Yes   Education Details Patient advised to hold on squats at this time. He was educated on the reasoning of exercises.    Person(s) Educated Patient   Methods Explanation;Demonstration   Comprehension Verbalized understanding;Returned demonstration  PT Short Term Goals - 07/23/15 1438    PT SHORT TERM GOAL #1   Title Patient will be I w/ HEp for ankle strength and ROM    Time 4   Period Weeks   Status On-going   PT SHORT TERM GOAL #2   Title Patient will ambulate 400' without the boot and without pain    Time 4   Period Weeks   Status On-going   PT SHORT TERM GOAL #3   Title Patient will increase gross left ankle strength to 5/5    Period Weeks   Status On-going   PT SHORT TERM GOAL #4   Title Patient will increase passive dorsiflexion by 10 degrees    Time 4   Period Weeks   Status On-going           PT Long Term Goals - 07/08/15 1726    PT LONG TERM GOAL #1   Title Patient will increase active dorsiflexion to 15 degrees in order to go up and down stairs comfortably    Time 8   Period Weeks   Status New   PT LONG TERM GOAL #2   Title Patient will ambulate 3000' without pain in order to return to  work    Time 8   Period Weeks   Status New   PT LONG TERM GOAL #3   Title Patient will stand for 3 hours without self report of pain in order to return to work.    Time 8   Period Weeks   Status New               Plan - 07/23/15 1404    Clinical Impression Statement Patient had some pain with squating in the left knee. He reports this has been a problem but the hope was when he came out of the boot the knee would improve. He reports his knee has been abouyt the same. He has been going up the stairs with the right leg first because of the knee.    PT Frequency 2x / week   PT Duration 8 weeks   PT Treatment/Interventions ADLs/Self Care Home Management;Cryotherapy;Electrical Stimulation;Therapeutic activities;Iontophoresis 4mg /ml Dexamethasone;Moist Heat;Therapeutic exercise;Functional mobility training;Stair training;Gait training;Ultrasound;Neuromuscular re-education;Patient/family education;Manual techniques;Dry needling;Energy conservation;Taping;Passive range of motion;Compression bandaging;Manual lymph drainage;Visual/perceptual remediation/compensation   PT Next Visit Plan continue manual therapy( achilles, calf, lateral ankle ) to increase ankle range of motion. continue standing activity; consider baps board, cybex leg press, rocker board sitting or standing, Contiinue with functional strength training as tolerated.    PT Home Exercise Plan ankle 4 way t-band, gastroc stretch, standing weight shift forward and back    Consulted and Agree with Plan of Care Patient      Patient will benefit from skilled therapeutic intervention in order to improve the following deficits and impairments:  Abnormal gait, Decreased activity tolerance, Decreased mobility, Decreased strength, Decreased endurance, Difficulty walking, Decreased range of motion, Decreased safety awareness, Impaired flexibility  Visit Diagnosis: Pain in right ankle and joints of right foot  Stiffness of right ankle, not  elsewhere classified  Difficulty in walking, not elsewhere classified  Localized edema  Muscle weakness (generalized)     Problem List There are no active problems to display for this patient.   Carney Living PT DPT  07/23/2015, 2:46 PM  [facility] [address] [place], [place], [address] Phone: [phone]   Fax:  [phone]  Name: Carl Knox MRN: [id number] Date of Birth:  07/19/1965     

## 2015-07-24 DIAGNOSIS — S93491D Sprain of other ligament of right ankle, subsequent encounter: Secondary | ICD-10-CM | POA: Diagnosis not present

## 2015-07-24 DIAGNOSIS — M25562 Pain in left knee: Secondary | ICD-10-CM | POA: Diagnosis not present

## 2015-07-24 MED FILL — MELOXICAM 15 MG TABLET: 15 | 30 days supply | Qty: 30 | Fill #0

## 2015-07-25 ENCOUNTER — Ambulatory Visit: Payer: 59 | Admitting: Physical Therapy

## 2015-07-25 DIAGNOSIS — R600 Localized edema: Secondary | ICD-10-CM

## 2015-07-25 DIAGNOSIS — R262 Difficulty in walking, not elsewhere classified: Secondary | ICD-10-CM | POA: Diagnosis not present

## 2015-07-25 DIAGNOSIS — M25671 Stiffness of right ankle, not elsewhere classified: Secondary | ICD-10-CM | POA: Diagnosis not present

## 2015-07-25 DIAGNOSIS — M6281 Muscle weakness (generalized): Secondary | ICD-10-CM | POA: Diagnosis not present

## 2015-07-25 DIAGNOSIS — M25571 Pain in right ankle and joints of right foot: Secondary | ICD-10-CM | POA: Diagnosis not present

## 2015-07-25 NOTE — Therapy (Signed)
[place], [place], [address] Phone: [phone]   Fax:  [phone]  Physical Therapy Treatment  Patient Details  Name: Carl Knox MRN: [id number] Date of Birth: 1965-12-22 Referring Provider: Dr Elsie Saas   Encounter Date: 07/25/2015      PT End of Session - 07/25/15 0943    Visit Number 5   Number of Visits 16   Date for PT Re-Evaluation 09/02/15   Authorization Type [facility]    PT Start Time 0936   PT Stop Time 1026   PT Time Calculation (min) 50 min      No past medical history on file.  No past surgical history on file.  There were no vitals filed for this visit.      Subjective Assessment - 07/25/15 0940    Subjective I am not wearing the brace anymore. The ankle is sore without support but its a better sore than with the brace.    Currently in Pain? Yes   Pain Score 2   or 3/10    Pain Location Ankle   Pain Orientation Right                         [facility] Adult PT Treatment/Exercise - 07/25/15 0001    Vasopneumatic   Number Minutes Vasopneumatic  15 minutes   Vasopnuematic Location  Ankle   Vasopneumatic Pressure Medium   Vasopneumatic Temperature  32   Ankle Exercises: Standing   BAPS Level 1;Standing  10 circles x 2 each way, UE support   SLS 30 sec x 2 on foam no UE support   Heel Raises 20 reps   Toe Raise 20 reps   Other Standing Ankle Exercises foward step up 6" 2x10;standing marchi on air-ex x20; step onto air-rex with right 2x10;, lateral step x10 on 6 inch, small retro step 4 inch with toe tap- increased pulling around achilles and malleoli   Ankle Exercises: Stretches   Soleus Stretch 1 rep;60 seconds   Gastroc Stretch 3 reps;30 seconds   Slant Board Stretch 30 seconds;3 reps                  PT Short Term Goals - 07/23/15 1438    PT SHORT TERM GOAL #1   Title Patient will be I w/ HEp for ankle strength and ROM    Time 4   Period  Weeks   Status On-going   PT SHORT TERM GOAL #2   Title Patient will ambulate 400' without the boot and without pain    Baseline slight pain    Time 4   Period Weeks   Status Partially Met   PT SHORT TERM GOAL #3   Title Patient will increase gross left ankle strength to 5/5    Period Weeks   Status On-going   PT SHORT TERM GOAL #4   Title Patient will increase passive dorsiflexion by 10 degrees    Baseline 9 degrees    Time 4   Period Weeks   Status On-going           PT Long Term Goals - 07/08/15 1726    PT LONG TERM GOAL #1   Title Patient will increase active dorsiflexion to 15 degrees in order to go up and down stairs comfortably    Time 8   Period Weeks   Status New   PT LONG TERM GOAL #2   Title Patient  will ambulate 3000' without pain in order to return to work    Time 8   Period Weeks   Status New   PT [place] #3   Title Patient will stand for 3 hours without self report of pain in order to return to work.    Time 8   Period Weeks   Status New               Plan - 07/25/15 1123    Clinical Impression Statement Increased soreness walking from to to clinic not wearing ASO however pt does not like the ASO so has opted to discontinue. He did well after last visit and keeps his ankle elevated with ice at home. Began lateral  step ups with good toleraance and some c/o right knee pain. Difficulty with step down and reports pulling around ankle. Instructed pt in gastroc and soleus stretches as well as SLS of foam. This increased his pain so we discontinued and used vaso to decrease edema and pain.    PT Next Visit Plan continue manual therapy( achilles, calf, lateral ankle ) to increase ankle range of motion. continue standing activity; consider baps board, cybex leg press, rocker board sitting or standing, Contiinue with functional strength training as tolerated.       Patient will benefit from skilled therapeutic intervention in order to improve the  following deficits and impairments:  Abnormal gait, Decreased activity tolerance, Decreased mobility, Decreased strength, Decreased endurance, Difficulty walking, Decreased range of motion, Decreased safety awareness, Impaired flexibility  Visit Diagnosis: Pain in right ankle and joints of right foot  Stiffness of right ankle, not elsewhere classified  Difficulty in walking, not elsewhere classified  Muscle weakness (generalized)  Localized edema     Problem List There are no active problems to display for this patient.   Dorene Ar, [place] 07/25/2015, 11:28 AM  [place], [place], [address] Phone: [phone]   Fax:  [phone]  Name: Gerod Caligiuri MRN: [id number] Date of Birth: 04-May-1965

## 2015-07-28 ENCOUNTER — Ambulatory Visit: Payer: 59 | Admitting: Physical Therapy

## 2015-07-28 DIAGNOSIS — R600 Localized edema: Secondary | ICD-10-CM

## 2015-07-28 DIAGNOSIS — M25671 Stiffness of right ankle, not elsewhere classified: Secondary | ICD-10-CM

## 2015-07-28 DIAGNOSIS — M25571 Pain in right ankle and joints of right foot: Secondary | ICD-10-CM

## 2015-07-28 DIAGNOSIS — M6281 Muscle weakness (generalized): Secondary | ICD-10-CM

## 2015-07-28 DIAGNOSIS — R262 Difficulty in walking, not elsewhere classified: Secondary | ICD-10-CM

## 2015-07-28 NOTE — Therapy (Signed)
[place], [place], [address] Phone: [phone]   Fax:  [phone]  Physical Therapy Treatment  Patient Details  Name: Carl Knox MRN: [id number] Date of Birth: 01/17/1966 Referring Provider: Dr Elsie Saas   Encounter Date: 07/28/2015      PT End of Session - 07/28/15 1151    Visit Number 6   Number of Visits 16   Date for PT Re-Evaluation 09/02/15   Authorization Type [facility]    PT Start Time 2355   PT Stop Time 7322   PT Time Calculation (min) 58 min      No past medical history on file.  No past surgical history on file.  There were no vitals filed for this visit.      Subjective Assessment - 07/28/15 1151    Subjective I was sore after last time but I can feel the improvement. I feel more mobility at the achilles.    Currently in Pain? Yes   Pain Score 2    Pain Location Ankle   Pain Frequency Intermittent   Aggravating Factors  walking   Pain Relieving Factors rest ice            [facility] PT Assessment - 07/28/15 0001    AROM   Right Ankle Dorsiflexion 0   Strength   Right Ankle Dorsiflexion 5/5   Right Ankle Inversion 5/5   Right Ankle Eversion 4+/5                     [facility] Adult PT Treatment/Exercise - 07/28/15 0001    Vasopneumatic   Number Minutes Vasopneumatic  15 minutes   Vasopnuematic Location  Ankle   Vasopneumatic Pressure Medium   Vasopneumatic Temperature  32   Manual Therapy   Manual Therapy Soft tissue mobilization;Passive ROM   Soft tissue mobilization right gastroc, proximal lateral and then distal medial areas of tenderness   Passive ROM DF   Ankle Exercises: Standing   BAPS Standing;Level 2  10 circles x 2 each way, UE support   SLS 57 sec on foam with no UE support   Rocker Board 1 minute   Rebounder R SLS with 2 toss best red ball , increased pain so kept left toe touch   Heel Raises --  30 reps   Toe Raise --  30 reps   Other  Standing Ankle Exercises foward step up 6" 2x10;standing marchi on air-ex x20; step onto air-rex with right 2x10;, lateral step x10 on 6 inch, small retro step 4 inch with toe tap- increased pulling around achilles and malleoli   Ankle Exercises: Stretches   Soleus Stretch 2 reps;30 seconds   Gastroc Stretch 2 reps;30 seconds   Slant Board Stretch 30 seconds;3 reps                PT Education - 07/28/15 1239    Education provided Yes   Education Details gastroc and soleus stretch   Person(s) Educated Patient   Methods Explanation;Handout   Comprehension Verbalized understanding          PT Short Term Goals - 07/28/15 1153    PT SHORT TERM GOAL #1   Title Patient will be I w/ HEp for ankle strength and ROM    Time 4   Period Weeks   Status Achieved   PT SHORT TERM GOAL #2   Title Patient will ambulate 400' without the boot and without pain  Baseline slight pain    Time 4   Period Weeks   Status Partially Met   PT SHORT TERM GOAL #3   Title Patient will increase gross left ankle strength to 5/5    Time 4   Period Weeks   Status On-going   PT SHORT TERM GOAL #4   Title Patient will increase passive dorsiflexion by 10 degrees    Baseline 9 degrees    Time 4   Period Weeks   Status On-going           PT Long Term Goals - 07/08/15 1726    PT LONG TERM GOAL #1   Title Patient will increase active dorsiflexion to 15 degrees in order to go up and down stairs comfortably    Time 8   Period Weeks   Status New   PT LONG TERM GOAL #2   Title Patient will ambulate 3000' without pain in order to return to work    Time 8   Period Weeks   Status New   PT LONG TERM GOAL #3   Title Patient will stand for 3 hours without self report of pain in order to return to work.    Time 8   Period Weeks   Status New               Plan - 07/28/15 1329    Clinical Impression Statement Pt reports he notes improved mobility with walking. His DF continues to lack  compared to left. Continued ankle atability exercises and used manual to soften and stetch gastroc musculature to improve DF. Updated HEP to include stretches. Vasopneumatic again to decrease edema and soreness post exercise.    PT Next Visit Plan continue manual therapy( achilles, calf, lateral ankle ) to increase ankle range of motion. continue standing activity; consider baps board, cybex leg press, rocker board sitting or standing, Contiinue with functional strength training as tolerated.       Patient will benefit from skilled therapeutic intervention in order to improve the following deficits and impairments:  Abnormal gait, Decreased activity tolerance, Decreased mobility, Decreased strength, Decreased endurance, Difficulty walking, Decreased range of motion, Decreased safety awareness, Impaired flexibility  Visit Diagnosis: Pain in right ankle and joints of right foot  Stiffness of right ankle, not elsewhere classified  Difficulty in walking, not elsewhere classified  Muscle weakness (generalized)  Localized edema     Problem List There are no active problems to display for this patient.   Dorene Ar, [place] 07/28/2015, 1:34 PM  [place], [place], [address] Phone: [phone]   Fax:  [phone]  Name: Sincere Liuzzi MRN: [id number] Date of Birth: April 14, 1965

## 2015-07-28 NOTE — Patient Instructions (Signed)
Soleus Stretch    Stand with right foot back, both knees bent. Keeping heel on floor, turned slightly out, lean into wall until stretch is felt in lower calf. Hold _30___ seconds. Repeat __3__ times per set. Do __1__ sets per session. Do _2___ sessions per day.  http://orth.exer.us/24   Copyright  VHI. All rights reserved.  Gastroc Stretch    Stand with right foot back, leg straight, forward leg bent. Keeping heel on floor, turned slightly out, lean into wall until stretch is felt in calf. Hold __30__ seconds. Repeat _3___ times per set. Do __1__ sets per session. Do __2__ sessions per day.  http://orth.exer.us/26   Copyright  VHI. All rights reserved.

## 2015-07-29 ENCOUNTER — Ambulatory Visit (INDEPENDENT_AMBULATORY_CARE_PROVIDER_SITE_OTHER): Payer: 59 | Admitting: Podiatry

## 2015-07-29 ENCOUNTER — Encounter: Payer: Self-pay | Admitting: Podiatry

## 2015-07-29 DIAGNOSIS — W450XXD Nail entering through skin, subsequent encounter: Secondary | ICD-10-CM

## 2015-07-29 DIAGNOSIS — T148XXA Other injury of unspecified body region, initial encounter: Secondary | ICD-10-CM

## 2015-07-29 DIAGNOSIS — T148 Other injury of unspecified body region: Secondary | ICD-10-CM

## 2015-07-29 NOTE — Progress Notes (Signed)
Subjective:     Patient ID: Carl Knox, male   DOB: 07-20-1965, 50 y.o.   MRN: [id number]  HPI this patient presents to the office for an evaluation of his left big toenail. He had fallen down stairs on 06/29/2015 and caused an injury which required the removal of the outside border of the left big toenail. Patient states that he is not having any pain or discomfort, but he is concerned about the discoloration that is noted under the outside great toenail, left foot. He denies any drainage from the site. He presents the office for continued evaluation and treatment of this nail   Review of Systems     Objective:   Physical Exam GENERAL APPEARANCE: Alert, conversant. Appropriately groomed. No acute distress.  VASCULAR: Pedal pulses are  palpable at  [facility] and PT bilateral.  Capillary refill time is immediate to all digits,  Normal temperature gradient.  Digital hair growth is present bilateral  NEUROLOGIC: sensation is normal to 5.07 monofilament at 5/5 sites bilateral.  Light touch is intact bilateral, Muscle strength normal.  MUSCULOSKELETAL: acceptable muscle strength, tone and stability bilateral.  Intrinsic muscluature intact bilateral.  Rectus appearance of foot and digits noted bilateral.   DERMATOLOGIC: No evidence of redness or swelling  Or drainage.  There is darkened area subungually at the site of the nail injury.        Assessment:     Hematoma left hallux subungually   Nail injurt subsequent visit.     Plan:     ROV     Told patient this is healing and the discoloration will resolve over time. RTC prn    Gardiner Barefoot DPM

## 2015-07-30 ENCOUNTER — Ambulatory Visit: Payer: 59 | Admitting: Physical Therapy

## 2015-07-30 DIAGNOSIS — R262 Difficulty in walking, not elsewhere classified: Secondary | ICD-10-CM | POA: Diagnosis not present

## 2015-07-30 DIAGNOSIS — M25571 Pain in right ankle and joints of right foot: Secondary | ICD-10-CM

## 2015-07-30 DIAGNOSIS — M25671 Stiffness of right ankle, not elsewhere classified: Secondary | ICD-10-CM | POA: Diagnosis not present

## 2015-07-30 DIAGNOSIS — M6281 Muscle weakness (generalized): Secondary | ICD-10-CM | POA: Diagnosis not present

## 2015-07-30 DIAGNOSIS — R600 Localized edema: Secondary | ICD-10-CM

## 2015-07-30 NOTE — Therapy (Signed)
[place], [place], [address] Phone: [phone]   Fax:  [phone]  Physical Therapy Treatment  Patient Details  Name: Carl Knox MRN: [id number] Date of Birth: [birth year] Referring Provider: Dr Elsie Saas   Encounter Date: 07/30/2015      PT End of Session - 07/30/15 1017    Visit Number 7   Number of Visits 16   Date for PT Re-Evaluation 09/02/15   Authorization Type [facility]    PT Start Time 1015   PT Stop Time 1115   PT Time Calculation (min) 60 min      No past medical history on file.  No past surgical history on file.  There were no vitals filed for this visit.      Subjective Assessment - 07/30/15 1018    Subjective I was not as sore after last visit.    Currently in Pain? No/denies            [facility] PT Assessment - 07/30/15 0001    Strength   Right Ankle Dorsiflexion 5/5   Right Ankle Inversion 5/5   Right Ankle Eversion 4+/5                     [facility] Adult PT Treatment/Exercise - 07/30/15 0001    Ambulation/Gait   Ambulation/Gait Yes   Ambulation/Gait Assistance 7: Independent   Ambulation Distance (Feet) 400 Feet   Assistive device None   Gait Pattern Step-through pattern   Ambulation Surface Level;Indoor   Gait Comments Ambulating in sneakers without  ASO, 2/10 pain   Vasopneumatic   Number Minutes Vasopneumatic  15 minutes   Vasopnuematic Location  Ankle   Vasopneumatic Pressure Medium   Vasopneumatic Temperature  32   Manual Therapy   Manual Therapy Soft tissue mobilization;Passive ROM   Soft tissue mobilization right gastroc, proximal lateral and then distal medial areas of tenderness   Ankle Exercises: Standing   BAPS Standing;Level 2  10 circles x 2 each way, UE support   SLS 1 minute level surface  1 minute foam without UE , several touches reqd   Rebounder R SLS 6 toss best- improved tolerance today   Heel Raises --  30 reps   Toe  Raise --  30 reps   Other Standing Ankle Exercises 6 inch step up x15, lateral step up 6 inch x 15 , step down/retro step up x 10 4 inch, alternating step taps on 8 inch step from foam x25   Other Standing Ankle Exercises SLS withj vectors no UE 4 way   Ankle Exercises: Stretches   Soleus Stretch 2 reps;30 seconds   Gastroc Stretch 2 reps;30 seconds   Slant Board Stretch 30 seconds;3 reps   Ankle Exercises: Aerobic   Stationary Bike L3 x 17mnutes                  PT Short Term Goals - 07/30/15 1019    PT SHORT TERM GOAL #1   Title Patient will be I w/ HEp for ankle strength and ROM    Time 4   Period Weeks   Status Achieved   PT SHORT TERM GOAL #2   Title Patient will ambulate 400' without the boot and without pain    Baseline 2/10 pain   Time 4   Period Weeks   Status Partially Met   PT SHORT TERM GOAL #3   Title Patient will increase gross left ankle strength  to 5/5    Time 4   Period Weeks   Status Partially Met   PT SHORT TERM GOAL #4   Title Patient will increase passive dorsiflexion by 10 degrees    Baseline 2   Time 4   Period Weeks   Status On-going           PT Long Term Goals - 07/08/15 1726    PT LONG TERM GOAL #1   Title Patient will increase active dorsiflexion to 15 degrees in order to go up and down stairs comfortably    Time 8   Period Weeks   Status New   PT LONG TERM GOAL #2   Title Patient will ambulate 3000' without pain in order to return to work    Time 8   Period Weeks   Status New   PT [place] #3   Title Patient will stand for 3 hours without self report of pain in order to return to work.    Time 8   Period Weeks   Status New               Plan - 07/30/15 1153    Clinical Impression Statement Pt reports 2/10 pain after 426f of gait. he demonstrates improved tolerance with SLS exercises and less pain. His DF AROM has improved to 2 degrees. Progressing toward goals.    PT Next Visit Plan continue manual  therapy( achilles, calf, lateral ankle ) to increase ankle range of motion. continue standing activity; consider baps board, cybex leg press, rocker board sitting or standing, Contiinue with functional strength training as tolerated.       Patient will benefit from skilled therapeutic intervention in order to improve the following deficits and impairments:  Abnormal gait, Decreased activity tolerance, Decreased mobility, Decreased strength, Decreased endurance, Difficulty walking, Decreased range of motion, Decreased safety awareness, Impaired flexibility  Visit Diagnosis: Pain in right ankle and joints of right foot  Stiffness of right ankle, not elsewhere classified  Difficulty in walking, not elsewhere classified  Muscle weakness (generalized)  Localized edema     Problem List There are no active problems to display for this patient.   DDorene Ar PDelaware7/12/2015, 11:59 AM  [place] [address]Phone: [phone]  Fax:  [phone] Name: JGannon HeinzmanMRN: [id number] of Birth: [birth year]

## 2015-08-01 ENCOUNTER — Encounter: Payer: Self-pay | Admitting: Podiatry

## 2015-08-01 NOTE — Progress Notes (Signed)
Patient came to the [place] office to sign a medical records release form so he could get a copy of his ov note from date of service 29 July 2015.

## 2015-08-04 ENCOUNTER — Ambulatory Visit: Payer: 59 | Admitting: Physical Therapy

## 2015-08-04 DIAGNOSIS — M25571 Pain in right ankle and joints of right foot: Secondary | ICD-10-CM

## 2015-08-04 DIAGNOSIS — M6281 Muscle weakness (generalized): Secondary | ICD-10-CM

## 2015-08-04 DIAGNOSIS — R600 Localized edema: Secondary | ICD-10-CM

## 2015-08-04 DIAGNOSIS — R262 Difficulty in walking, not elsewhere classified: Secondary | ICD-10-CM

## 2015-08-04 DIAGNOSIS — M25671 Stiffness of right ankle, not elsewhere classified: Secondary | ICD-10-CM

## 2015-08-04 NOTE — Therapy (Signed)
[place], [place], [address] Phone: [phone]   Fax:  [phone]  Physical Therapy Treatment  Patient Details  Name: Carl Knox MRN: [id number] Date of Birth: 1966/01/17 Referring Provider: Dr Elsie Saas   Encounter Date: 08/04/2015      PT End of Session - 08/04/15 1250    Visit Number 8   Number of Visits 16   Date for PT Re-Evaluation 09/02/15   Authorization Type [facility]    PT Start Time 5277   PT Stop Time 1240   PT Time Calculation (min) 55 min   Activity Tolerance Patient tolerated treatment well   Behavior During Therapy [facility] for tasks assessed/performed      No past medical history on file.  No past surgical history on file.  There were no vitals filed for this visit.      Subjective Assessment - 08/04/15 1154    Subjective Patient reported he flet pretty good aftetr the last visit. He was a little sore this weekend because he did some extra walking.    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    Limitations Walking;Standing   How long can you sit comfortably? N/A    How long can you stand comfortably? > 20 minutes    How long can you walk comfortably? Household distances    Diagnostic tests Nothing in the system: perpatient x-rays (-)    Patient Stated Goals To get back to work    Currently in Pain? No/denies   Pain Score 2    Pain Location Ankle   Pain Orientation Right   Pain Descriptors / Indicators Aching   Pain Type Acute pain   Pain Onset 1 to 4 weeks ago   Pain Frequency Occasional   Aggravating Factors  walkin g   Pain Relieving Factors rest, ice    Effect of Pain on Daily Activities difficulty walking    Multiple Pain Sites No                         [facility] Adult PT Treatment/Exercise - 08/04/15 0001    Modalities   Modalities Vasopneumatic   Vasopneumatic   Number Minutes Vasopneumatic   15 minutes   Vasopnuematic Location  Ankle   Vasopneumatic Pressure Medium   Vasopneumatic Temperature  32   Manual Therapy   Manual Therapy Soft tissue mobilization;Passive ROM   Soft tissue mobilization right gastroc, proximal lateral and then distal medial areas of tenderness   Ankle Exercises: Standing   SLS 1 minute level surfacex2   1 minute foam without UE , several touches reqd   Heel Raises 20 reps   Other Standing Ankle Exercises 8" ch step up x15, lateral step up 6 inch x 15 , step down/retro step up x 10 4 inch, Mini squats x10; marching on air-ex; step and hold onto air-ex,    Other Standing Ankle Exercises leg press 4 plates 2x10    Ankle Exercises: Stretches   Soleus Stretch 2 reps;30 seconds   Gastroc Stretch 2 reps;30 seconds   Ankle Exercises: Aerobic   Stationary Bike 78mnutes Nu Step                 PT Education - 08/04/15 1250    Education provided Yes   Person(s) Educated Patient   Methods Explanation;Handout   Comprehension Verbalized understanding;Returned demonstration  PT Short Term Goals - 07/30/15 1019    PT SHORT TERM GOAL #1   Title Patient will be I w/ HEp for ankle strength and ROM    Time 4   Period Weeks   Status Achieved   PT SHORT TERM GOAL #2   Title Patient will ambulate 400' without the boot and without pain    Baseline 2/10 pain   Time 4   Period Weeks   Status Partially Met   PT SHORT TERM GOAL #3   Title Patient will increase gross left ankle strength to 5/5    Time 4   Period Weeks   Status Partially Met   PT SHORT TERM GOAL #4   Title Patient will increase passive dorsiflexion by 10 degrees    Baseline 2   Time 4   Period Weeks   Status On-going           PT Long Term Goals - 07/08/15 1726    PT LONG TERM GOAL #1   Title Patient will increase active dorsiflexion to 15 degrees in order to go up and down stairs comfortably    Time 8   Period Weeks   Status New   PT LONG TERM GOAL #2   Title  Patient will ambulate 3000' without pain in order to return to work    Time 8   Period Weeks   Status New   PT LONG TERM GOAL #3   Title Patient will stand for 3 hours without self report of pain in order to return to work.    Time 8   Period Weeks   Status New               Plan - 08/04/15 1252    Clinical Impression Statement Patient conitnues to have some pain with steps. he will have to do steps at work. His range appears to be inmproving. He is moving towards goals but continues to have some pain.    Rehab Potential Good   PT Frequency 2x / week   PT Duration 8 weeks   PT Treatment/Interventions ADLs/Self Care Home Management;Cryotherapy;Electrical Stimulation;Therapeutic activities;Iontophoresis 24m/ml Dexamethasone;Moist Heat;Therapeutic exercise;Functional mobility training;Stair training;Gait training;Ultrasound;Neuromuscular re-education;Patient/family education;Manual techniques;Dry needling;Energy conservation;Taping;Passive range of motion;Compression bandaging;Manual lymph drainage;Visual/perceptual remediation/compensation   PT Next Visit Plan continue manual therapy( achilles, calf, lateral ankle ) to increase ankle range of motion. continue standing activity; consider baps board, cybex leg press, rocker board sitting or standing, Contiinue with functional strength training as tolerated.    PT Home Exercise Plan ankle 4 way t-band, gastroc stretch, standing weight shift forward and back    Consulted and Agree with Plan of Care Patient      Patient will benefit from skilled therapeutic intervention in order to improve the following deficits and impairments:  Abnormal gait, Decreased activity tolerance, Decreased mobility, Decreased strength, Decreased endurance, Difficulty walking, Decreased range of motion, Decreased safety awareness, Impaired flexibility  Visit Diagnosis: Stiffness of right ankle, not elsewhere classified  Pain in right ankle and joints of right  foot  Difficulty in walking, not elsewhere classified  Muscle weakness (generalized)  Localized edema     Problem List There are no active problems to display for this patient.   DCarney LivingPT DPT  08/04/2015, 1:04 PM  [facility] [address]Phone: [phone]  Fax:  [phone] Name: Carl RahmaniMRN: [id number] of Birth: 210-09-1965

## 2015-08-06 ENCOUNTER — Ambulatory Visit: Payer: 59 | Admitting: Physical Therapy

## 2015-08-06 DIAGNOSIS — M25671 Stiffness of right ankle, not elsewhere classified: Secondary | ICD-10-CM

## 2015-08-06 DIAGNOSIS — R262 Difficulty in walking, not elsewhere classified: Secondary | ICD-10-CM

## 2015-08-06 DIAGNOSIS — R600 Localized edema: Secondary | ICD-10-CM | POA: Diagnosis not present

## 2015-08-06 DIAGNOSIS — M6281 Muscle weakness (generalized): Secondary | ICD-10-CM

## 2015-08-06 DIAGNOSIS — M25571 Pain in right ankle and joints of right foot: Secondary | ICD-10-CM

## 2015-08-06 NOTE — Therapy (Signed)
[place], [place], [address] Phone: [phone]   Fax:  [phone]  Physical Therapy Treatment  Patient Details  Name: Carl Knox MRN: [id number] Date of Birth: 03-23-1965 Referring Provider: Dr Elsie Saas   Encounter Date: 08/06/2015      PT End of Session - 08/06/15 1208    Visit Number 9   Number of Visits 16   Date for PT Re-Evaluation 09/02/15   Authorization Type [facility]    PT Start Time 1146   PT Stop Time 1238   PT Time Calculation (min) 52 min   Activity Tolerance Patient tolerated treatment well   Behavior During Therapy [facility] for tasks assessed/performed      No past medical history on file.  No past surgical history on file.  There were no vitals filed for this visit.      Subjective Assessment - 08/06/15 1206    Subjective Patient was sore today after doing a lot of walking this morning. His pain is about a 1-2out of 10. His pain is around his ATFL.    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    Limitations Walking;Standing   How long can you sit comfortably? N/A    How long can you stand comfortably? > 20 minutes    How long can you walk comfortably? Household distances    Diagnostic tests Nothing in the system: perpatient x-rays (-)    Patient Stated Goals To get back to work    Pain Score 2    Pain Location Ankle   Pain Orientation Right   Pain Descriptors / Indicators Aching   Pain Type Acute pain   Pain Onset 1 to 4 weeks ago   Pain Frequency Occasional   Aggravating Factors  walking    Pain Relieving Factors rest, ice    Effect of Pain on Daily Activities difficulty walking    Multiple Pain Sites No                         [facility] Adult PT Treatment/Exercise - 08/06/15 0001    Modalities   Modalities Cryotherapy   Cryotherapy   Number Minutes Cryotherapy 10 Minutes   Cryotherapy Location Ankle    Type of Cryotherapy Ice pack   Manual Therapy   Manual Therapy Soft tissue mobilization;Passive ROM   Soft tissue mobilization right gastroc, proximal lateral and then distal medial areas of tenderness   Passive ROM DF   Ankle Exercises: Standing   Rocker Board --  20 reps fwd/ lateral    Other Standing Ankle Exercises 8" ch step up x20, lateral step up 68inch x 20 , step down/retro step up x 10 4 inch, Mini squats x10; marching on air-ex; step and hold onto air-ex,    Ankle Exercises: Stretches   Soleus Stretch 2 reps;30 seconds   Gastroc Stretch 2 reps;30 seconds   Ankle Exercises: Aerobic   Stationary Bike 64minutes Nu Step                 PT Education - 08/06/15 1208    Education provided Yes   Education Details gastroc and soleus stretch    Person(s) Educated Patient   Methods Explanation;Handout   Comprehension Verbalized understanding;Returned demonstration          PT Short Term Goals - 08/06/15 1755    PT SHORT TERM GOAL #1  Title Patient will be I w/ HEp for ankle strength and ROM    Time 4   Period Weeks   Status Achieved   PT SHORT TERM GOAL #2   Title Patient will ambulate 400' without the boot and without pain    Baseline no pain with short distances    Time 4   Period Weeks   Status Achieved   PT SHORT TERM GOAL #3   Title Patient will increase gross left ankle strength to 5/5    Time 4   Period Weeks   Status Achieved   PT SHORT TERM GOAL #4   Title Patient will increase passive dorsiflexion by 10 degrees    Baseline 2   Time 4   Period Weeks   Status On-going           PT Long Term Goals - 07/08/15 1726    PT LONG TERM GOAL #1   Title Patient will increase active dorsiflexion to 15 degrees in order to go up and down stairs comfortably    Time 8   Period Weeks   Status New   PT LONG TERM GOAL #2   Title Patient will ambulate 3000' without pain in order to return to work    Time 8   Period Weeks   Status New   PT LONG TERM  GOAL #3   Title Patient will stand for 3 hours without self report of pain in order to return to work.    Time 8   Period Weeks   Status New               Plan - 08/06/15 1754    Clinical Impression Statement Patient continues to have some pain with steps and when he stands for long eriods of time. His pain is decreaseing with activity but it still get sore when he stands for more then a few hours.    PT Treatment/Interventions ADLs/Self Care Home Management;Cryotherapy;Electrical Stimulation;Therapeutic activities;Iontophoresis 4mg /ml Dexamethasone;Moist Heat;Therapeutic exercise;Functional mobility training;Stair training;Gait training;Ultrasound;Neuromuscular re-education;Patient/family education;Manual techniques;Dry needling;Energy conservation;Taping;Passive range of motion;Compression bandaging;Manual lymph drainage;Visual/perceptual remediation/compensation   PT Next Visit Plan continue manual therapy( achilles, calf, lateral ankle ) to increase ankle range of motion. continue standing activity; consider baps board, cybex leg press, rocker board sitting or standing, Contiinue with functional strength training as tolerated.    PT Home Exercise Plan ankle 4 way t-band, gastroc stretch, standing weight shift forward and back    Consulted and Agree with Plan of Care Patient      Patient will benefit from skilled therapeutic intervention in order to improve the following deficits and impairments:  Abnormal gait, Decreased activity tolerance, Decreased mobility, Decreased strength, Decreased endurance, Difficulty walking, Decreased range of motion, Decreased safety awareness, Impaired flexibility  Visit Diagnosis: Pain in right ankle and joints of right foot  Stiffness of right ankle, not elsewhere classified  Difficulty in walking, not elsewhere classified  Muscle weakness (generalized)  Localized edema     Problem List There are no active problems to display for this  patient.   Carney Living PT DPT  08/06/2015, 5:59 PM  [facility] [address] [place], [place], [address] Phone: [phone]   Fax:  [phone]  Name: Carl Knox MRN: [id number] Date of Birth: [birth year]

## 2015-08-07 DIAGNOSIS — M25562 Pain in left knee: Secondary | ICD-10-CM | POA: Diagnosis not present

## 2015-08-18 ENCOUNTER — Other Ambulatory Visit (HOSPITAL_COMMUNITY): Payer: Self-pay | Admitting: Orthopedic Surgery

## 2015-08-18 DIAGNOSIS — S86311A Strain of muscle(s) and tendon(s) of peroneal muscle group at lower leg level, right leg, initial encounter: Secondary | ICD-10-CM | POA: Diagnosis not present

## 2015-08-18 DIAGNOSIS — M25571 Pain in right ankle and joints of right foot: Secondary | ICD-10-CM

## 2015-08-19 ENCOUNTER — Ambulatory Visit: Payer: 59 | Attending: Orthopedic Surgery | Admitting: Physical Therapy

## 2015-08-19 DIAGNOSIS — M6281 Muscle weakness (generalized): Secondary | ICD-10-CM | POA: Diagnosis not present

## 2015-08-19 DIAGNOSIS — M25571 Pain in right ankle and joints of right foot: Secondary | ICD-10-CM | POA: Insufficient documentation

## 2015-08-19 DIAGNOSIS — R262 Difficulty in walking, not elsewhere classified: Secondary | ICD-10-CM | POA: Insufficient documentation

## 2015-08-19 DIAGNOSIS — M25671 Stiffness of right ankle, not elsewhere classified: Secondary | ICD-10-CM | POA: Insufficient documentation

## 2015-08-19 DIAGNOSIS — R600 Localized edema: Secondary | ICD-10-CM | POA: Diagnosis not present

## 2015-08-20 ENCOUNTER — Ambulatory Visit: Payer: 59 | Admitting: Physical Therapy

## 2015-08-20 DIAGNOSIS — R600 Localized edema: Secondary | ICD-10-CM | POA: Diagnosis not present

## 2015-08-20 DIAGNOSIS — R262 Difficulty in walking, not elsewhere classified: Secondary | ICD-10-CM

## 2015-08-20 DIAGNOSIS — M6281 Muscle weakness (generalized): Secondary | ICD-10-CM

## 2015-08-20 DIAGNOSIS — M25571 Pain in right ankle and joints of right foot: Secondary | ICD-10-CM | POA: Diagnosis not present

## 2015-08-20 DIAGNOSIS — M25671 Stiffness of right ankle, not elsewhere classified: Secondary | ICD-10-CM

## 2015-08-20 NOTE — Therapy (Signed)
[place], [place], [address] Phone: [phone]   Fax:  [phone]  Physical Therapy Treatment  Patient Details  Name: Carl Knox MRN: [id number] Date of Birth: 06-19-1965 Referring Provider: Dr Elsie Saas   Encounter Date: 08/20/2015      PT End of Session - 08/20/15 1440    Visit Number 11   Number of Visits 16   Date for PT Re-Evaluation 09/02/15   Authorization Type [facility]    PT Start Time [id number]   PT Stop Time 1508   PT Time Calculation (min) 53 min   Activity Tolerance Patient tolerated treatment well   Behavior During Therapy [facility] for tasks assessed/performed      No past medical history on file.  No past surgical history on file.  There were no vitals filed for this visit.      Subjective Assessment - 08/20/15 1422    Subjective Patient feels the medicine is helping him. He was not sore last night. He feels like his pain is about a 1/10.    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    Limitations Walking;Standing   How long can you sit comfortably? N/A    How long can you stand comfortably? > 20 minutes    How long can you walk comfortably? Household distances    Diagnostic tests Nothing in the system: perpatient x-rays (-)    Patient Stated Goals To get back to work    Currently in Pain? No/denies   Pain Score 1    Pain Location Ankle   Pain Orientation Right   Pain Descriptors / Indicators Aching   Pain Type Chronic pain   Pain Onset 1 to 4 weeks ago   Pain Frequency Occasional   Aggravating Factors  walking    Pain Relieving Factors rest, ice, meloxicam    Effect of Pain on Daily Activities going down the stirs.             [facility] PT Assessment - 08/20/15 0001      AROM   Right Ankle Dorsiflexion 4     PROM   Right Ankle Dorsiflexion 8   Right Ankle Plantar Flexion 40   Right Ankle Inversion 40   Right Ankle  Eversion 20     Strength   Right Ankle Dorsiflexion 5/5   Right Ankle Plantar Flexion 5/5   Right Ankle Inversion 5/5   Right Ankle Eversion 5/5     Palpation   Palpation comment Patient continues to have slight pain in the distall achillies area.      Ambulation/Gait   Gait Comments Slight decrease in signle leg time when the patient fatigues.                      [facility] Adult PT Treatment/Exercise - 08/20/15 0001      Self-Care   Other Self-Care Comments  reviewed the improtance of continueing stregthening at home.      Modalities   Modalities Cryotherapy     Cryotherapy   Number Minutes Cryotherapy 10 Minutes   Cryotherapy Location Ankle   Type of Cryotherapy Ice pack     Manual Therapy   Manual Therapy Soft tissue mobilization;Passive ROM   Soft tissue mobilization right gastroc, proximal lateral and then distal medial areas of tenderness   Passive ROM DF     Ankle Exercises: Standing   Rocker Board --  20 reps fwd/ lateral    Other Standing Ankle Exercises 8" ch step up x20, lateral step up 8inch x 20 , step down 4 inch 2x10;  stepretro step up x 10 4 inch, Mini squats x10; marching on air-ex; step and hold onto air-ex, rebounder 2x10 cone drill 2x10     Ankle Exercises: Stretches   Soleus Stretch 2 reps;30 seconds   Gastroc Stretch 2 reps;30 seconds     Ankle Exercises: Aerobic   Stationary Bike 47minutes Nu Step                 PT Education - 08/20/15 1435    Education provided Yes   Education Details continue to work on step downs at home    Person(s) Educated Patient   Methods Explanation;Demonstration   Comprehension Verbalized understanding;Returned demonstration          PT Short Term Goals - 08/19/15 1718      PT SHORT TERM GOAL #1   Title Patient will be I w/ HEp for ankle strength and ROM    Time 4   Period Weeks   Status Achieved     PT SHORT TERM GOAL #2   Title Patient will ambulate 400' without the boot and  without pain    Baseline no pain with short distances    Time 4   Period Weeks   Status Achieved     PT SHORT TERM GOAL #3   Title Patient will increase gross left ankle strength to 5/5    Time 4   Period Weeks   Status Achieved     PT SHORT TERM GOAL #4   Title Patient will increase passive dorsiflexion by 10 degrees    Baseline 2   Time 4   Period Weeks   Status On-going           PT Long Term Goals - 08/20/15 1051      PT LONG TERM GOAL #1   Title Patient will increase active dorsiflexion to 15 degrees in order to go up and down stairs comfortably    Baseline 8 degrees    Period Weeks   Status On-going     PT LONG TERM GOAL #2   Title Patient will ambulate 3000' without pain in order to return to work    Baseline improved walking distance    Time 8   Period Weeks   Status On-going     PT LONG TERM GOAL #3   Title Patient will stand for 3 hours without self report of pain in order to return to work.    Baseline still has pain when he stands for too long    Time 8   Period Weeks   Status On-going               Plan - 08/20/15 1512    Clinical Impression Statement Patient continues to make good progress. He had had slight increase in painaround his ATFL. Therapy used iontophoresis to decreased inflammation in the ATLF. Therapy will continue to progress stregthening as tolerated.    Rehab Potential Good   PT Frequency 2x / week   PT Duration 8 weeks   PT Treatment/Interventions ADLs/Self Care Home Management;Cryotherapy;Electrical Stimulation;Therapeutic activities;Iontophoresis 4mg /ml Dexamethasone;Moist Heat;Therapeutic exercise;Functional mobility training;Stair training;Gait training;Ultrasound;Neuromuscular re-education;Patient/family education;Manual techniques;Dry needling;Energy conservation;Taping;Passive range of motion;Compression bandaging;Manual lymph drainage;Visual/perceptual remediation/compensation   PT Next Visit Plan continue manual  therapy( achilles, calf, lateral ankle ) to increase ankle range of motion. continue standing  activity; consider baps board, cybex leg press, rocker board sitting or standing, Contiinue with functional strength training as tolerated.    PT Home Exercise Plan ankle 4 way t-band, gastroc stretch, standing weight shift forward and back    Consulted and Agree with Plan of Care Patient      Patient will benefit from skilled therapeutic intervention in order to improve the following deficits and impairments:  Abnormal gait, Decreased activity tolerance, Decreased mobility, Decreased strength, Decreased endurance, Difficulty walking, Decreased range of motion, Decreased safety awareness, Impaired flexibility  Visit Diagnosis: Pain in right ankle and joints of right foot  Stiffness of right ankle, not elsewhere classified  Difficulty in walking, not elsewhere classified  Muscle weakness (generalized)  Localized edema     Problem List There are no active problems to display for this patient.   Carney Living PT DPT  08/20/2015, 3:14 PM  [facility] [address] [place], [place], [address] Phone: [phone]   Fax:  [phone]  Name: Carl Knox MRN: [id number] Date of Birth: 12-08-1965

## 2015-08-20 NOTE — Therapy (Signed)
[place], [place], [address] Phone: [phone]   Fax:  [phone]  Physical Therapy Treatment  Patient Details  Name: Carl Knox MRN: [id number] Date of Birth: Sep 12, 1965 Referring Provider: Dr Elsie Saas   Encounter Date: 08/19/2015      PT End of Session - 08/19/15 1708    Visit Number 10   Number of Visits 16   Date for PT Re-Evaluation 09/02/15   Authorization Type [facility]    PT Start Time 1630   PT Stop Time 1714   PT Time Calculation (min) 44 min   Activity Tolerance Patient tolerated treatment well   Behavior During Therapy [facility] for tasks assessed/performed      No past medical history on file.  No past surgical history on file.  There were no vitals filed for this visit.      Subjective Assessment - 08/19/15 1637    Subjective Patient reports that over the weekend he helped his daughter move and his ankle become sore. He feels like his ankle is getting better but he still does not feel like it is strong enough to stand for a twelve hour shift going up and down the stairs. He can go up the stairs without difficulty but feels like he is unsytable comeing down the stairs.      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    Limitations Walking;Standing   How long can you stand comfortably? > 20 minutes    How long can you walk comfortably? Household distances    Diagnostic tests Nothing in the system: perpatient x-rays (-)    Patient Stated Goals To get back to work    Currently in Pain? No/denies   Pain Score 2    Pain Location Ankle   Pain Orientation Right   Pain Descriptors / Indicators Aching   Pain Type Chronic pain   Pain Onset 1 to 4 weeks ago   Pain Frequency Occasional   Aggravating Factors  walking    Pain Relieving Factors rest ice    Effect of Pain on Daily Activities difficulty going down stiars    Multiple Pain Sites  No            [facility] PT Assessment - 08/20/15 0001      AROM   Right Ankle Dorsiflexion 4     PROM   Right Ankle Dorsiflexion 8   Right Ankle Plantar Flexion 40   Right Ankle Inversion 40   Right Ankle Eversion 20     Strength   Right Ankle Dorsiflexion 5/5   Right Ankle Plantar Flexion 5/5   Right Ankle Inversion 5/5   Right Ankle Eversion 5/5     Palpation   Palpation comment Patient continues to have slight pain in the distall achillies area.      Ambulation/Gait   Gait Comments Slight decrease in signle leg time when the patient fatigues.                      [facility] Adult PT Treatment/Exercise - 08/20/15 0001      Self-Care   Other Self-Care Comments  reviewed the improtance of continueing stregthening at home.      Modalities   Modalities Cryotherapy     Cryotherapy   Number Minutes Cryotherapy 10 Minutes   Cryotherapy Location Ankle   Type of Cryotherapy Ice pack     Manual  Therapy   Manual Therapy Soft tissue mobilization;Passive ROM   Soft tissue mobilization right gastroc, proximal lateral and then distal medial areas of tenderness   Passive ROM DF     Ankle Exercises: Standing   Rocker Board --  20 reps fwd/ lateral    Other Standing Ankle Exercises 8" ch step up x20, lateral step up 8inch x 20 , step down 4 inch 2x10;  stepretro step up x 10 4 inch, Mini squats x10; marching on air-ex; step and hold onto air-ex, rebounder 2x10 cone drill 2x10     Ankle Exercises: Stretches   Soleus Stretch 2 reps;30 seconds   Gastroc Stretch 2 reps;30 seconds     Ankle Exercises: Aerobic   Stationary Bike 76minutes Nu Step                   PT Short Term Goals - 08/19/15 1718      PT SHORT TERM GOAL #1   Title Patient will be I w/ HEp for ankle strength and ROM    Time 4   Period Weeks   Status Achieved     PT SHORT TERM GOAL #2   Title Patient will ambulate 400' without the boot and without pain    Baseline no pain with short  distances    Time 4   Period Weeks   Status Achieved     PT SHORT TERM GOAL #3   Title Patient will increase gross left ankle strength to 5/5    Time 4   Period Weeks   Status Achieved     PT SHORT TERM GOAL #4   Title Patient will increase passive dorsiflexion by 10 degrees    Baseline 2   Time 4   Period Weeks   Status On-going           PT Long Term Goals - 08/20/15 1051      PT LONG TERM GOAL #1   Title Patient will increase active dorsiflexion to 15 degrees in order to go up and down stairs comfortably    Baseline 8 degrees    Period Weeks   Status On-going     PT LONG TERM GOAL #2   Title Patient will ambulate 3000' without pain in order to return to work    Baseline improved walking distance    Time 8   Period Weeks   Status On-going     PT LONG TERM GOAL #3   Title Patient will stand for 3 hours without self report of pain in order to return to work.    Baseline still has pain when he stands for too long    Time 8   Period Weeks   Status On-going               Plan - 08/19/15 1709    Clinical Impression Statement Patient feels like his strength is improving. Therapy increased the intensity of his exercises toda. He continues to preport some pain in his distal achillies. He was able to complete high level single leg activty. He performed a 4 inch step down. Therapy will continue to work to increase his strength. The patient is not sure how much more he can come 2nd to his co-pay.    Rehab Potential Good   PT Treatment/Interventions ADLs/Self Care Home Management;Cryotherapy;Electrical Stimulation;Therapeutic activities;Iontophoresis 4mg /ml Dexamethasone;Moist Heat;Therapeutic exercise;Functional mobility training;Stair training;Gait training;Ultrasound;Neuromuscular re-education;Patient/family education;Manual techniques;Dry needling;Energy conservation;Taping;Passive range of motion;Compression bandaging;Manual lymph drainage;Visual/perceptual  remediation/compensation   PT Next  Visit Plan continue manual therapy( achilles, calf, lateral ankle ) to increase ankle range of motion. continue standing activity; consider baps board, cybex leg press, rocker board sitting or standing, Contiinue with functional strength training as tolerated.    PT Home Exercise Plan ankle 4 way t-band, gastroc stretch, standing weight shift forward and back    Consulted and Agree with Plan of Care Patient      Patient will benefit from skilled therapeutic intervention in order to improve the following deficits and impairments:  Abnormal gait, Decreased activity tolerance, Decreased mobility, Decreased strength, Decreased endurance, Difficulty walking, Decreased range of motion, Decreased safety awareness, Impaired flexibility  Visit Diagnosis: Pain in right ankle and joints of right foot  Stiffness of right ankle, not elsewhere classified  Difficulty in walking, not elsewhere classified  Muscle weakness (generalized)  Localized edema     Problem List There are no active problems to display for this patient.   Carney Living  PT DPT  08/20/2015, 10:56 AM  [facility] [address] [place], [place], [address] Phone: [phone]   Fax:  [phone]  Name: Carl Knox MRN: [id number] Date of Birth: 02-Sep-1965

## 2015-08-26 ENCOUNTER — Ambulatory Visit: Payer: 59 | Admitting: Physical Therapy

## 2015-08-26 DIAGNOSIS — R600 Localized edema: Secondary | ICD-10-CM

## 2015-08-26 DIAGNOSIS — M25671 Stiffness of right ankle, not elsewhere classified: Secondary | ICD-10-CM

## 2015-08-26 DIAGNOSIS — M25571 Pain in right ankle and joints of right foot: Secondary | ICD-10-CM

## 2015-08-26 DIAGNOSIS — M6281 Muscle weakness (generalized): Secondary | ICD-10-CM

## 2015-08-26 DIAGNOSIS — R262 Difficulty in walking, not elsewhere classified: Secondary | ICD-10-CM

## 2015-08-26 NOTE — Therapy (Signed)
[place], [place], [address] Phone: [phone]   Fax:  [phone]  Physical Therapy Treatment  Patient Details  Name: Carl Knox MRN: [id number] Date of Birth: [birth year] Referring Provider: Dr Elsie Saas   Encounter Date: 08/26/2015      PT End of Session - 08/26/15 1028    Visit Number 12   Number of Visits 16   Date for PT Re-Evaluation 09/02/15   Authorization Type [facility]    PT Start Time 1019   PT Stop Time 1110   PT Time Calculation (min) 51 min   Activity Tolerance Patient tolerated treatment well   Behavior During Therapy [facility] for tasks assessed/performed      No past medical history on file.  No past surgical history on file.  There were no vitals filed for this visit.      Subjective Assessment - 08/26/15 1024    Subjective Patient reports he was sore for a few days after his last treatment. Today he is having very little pain.    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    Limitations Walking;Standing   How long can you sit comfortably? N/A    How long can you stand comfortably? > 20 minutes    How long can you walk comfortably? Household distances    Diagnostic tests Nothing in the system: perpatient x-rays (-)    Patient Stated Goals To get back to work    Currently in Pain? Yes   Pain Score 1    Pain Location Ankle   Pain Orientation Right   Pain Descriptors / Indicators Aching   Pain Type Chronic pain   Pain Onset More than a month ago   Pain Frequency Occasional   Aggravating Factors  walking    Pain Relieving Factors rest, ice , meloxicam    Effect of Pain on Daily Activities going down the stairs    Multiple Pain Sites No                         [facility] Adult PT Treatment/Exercise - 08/26/15 0001      Knee/Hip Exercises: Standing   Heel Raises Limitations 20x   Lateral Step Up Limitations 8"  x20    Step Down Limitations 4" 2x10    Functional Squat Limitations 20x   SLS 3x25 sec    Other Standing Knee Exercises step onto air-ex fwd and side to side 20x each      Modalities   Modalities Cryotherapy     Cryotherapy   Number Minutes Cryotherapy 10 Minutes   Cryotherapy Location Ankle   Type of Cryotherapy Ice pack     Manual Therapy   Manual Therapy Soft tissue mobilization;Passive ROM   Soft tissue mobilization right gastroc, proximal lateral and then distal medial areas of tenderness   Passive ROM DF     Ankle Exercises: Stretches   Soleus Stretch 2 reps;30 seconds   Gastroc Stretch 2 reps;30 seconds     Ankle Exercises: Aerobic   Stationary Bike 61minutes Nu Step      Ankle Exercises: Standing   SLS 3 x30 sec    Other Standing Ankle Exercises 8" ch step up x20, lateral step up 8inch x 20 , step down 4 inch 2x10;  stepretro step up x 10 4 inch, Mini squats x10; marching on air-ex; step and hold onto air-ex, rebounder  2x10 cone drill 2x10                PT Education - 08/26/15 1211    Education provided Yes   Education Details continue to work on steps at home    Person(s) Educated Patient   Methods Explanation   Comprehension Verbalized understanding;Returned demonstration          PT Short Term Goals - 08/26/15 1225      PT SHORT TERM GOAL #1   Title Patient will be I w/ HEp for ankle strength and ROM    Time 4   Period Weeks   Status Achieved     PT SHORT TERM GOAL #2   Title Patient will ambulate 400' without the boot and without pain    Baseline no pain with short distances    Time 4   Period Weeks   Status Achieved     PT SHORT TERM GOAL #3   Title Patient will increase gross left ankle strength to 5/5    Time 4   Period Weeks   Status Achieved     PT SHORT TERM GOAL #4   Title Patient will increase passive dorsiflexion by 10 degrees    Baseline still limited 8/8    Time 4   Period Weeks   Status On-going           PT  Long Term Goals - 08/20/15 1051      PT LONG TERM GOAL #1   Title Patient will increase active dorsiflexion to 15 degrees in order to go up and down stairs comfortably    Baseline 8 degrees    Period Weeks   Status On-going     PT LONG TERM GOAL #2   Title Patient will ambulate 3000' without pain in order to return to work    Baseline improved walking distance    Time 8   Period Weeks   Status On-going     PT LONG TERM GOAL #3   Title Patient will stand for 3 hours without self report of pain in order to return to work.    Baseline still has pain when he stands for too long    Time 8   Period Weeks   Status On-going               Plan - 08/26/15 1211    Clinical Impression Statement Patient continues to make progress. he completed the treatment today with no pain. he was able to do stesp and side steps without increased pain. Therapy will continue to progress his endurance as tolerated. He reported no increase in pain after treamtnet so iontophoresis was held.    Rehab Potential Good   PT Frequency 2x / week   PT Duration 8 weeks   PT Treatment/Interventions ADLs/Self Care Home Management;Cryotherapy;Electrical Stimulation;Therapeutic activities;Iontophoresis 4mg /ml Dexamethasone;Moist Heat;Therapeutic exercise;Functional mobility training;Stair training;Gait training;Ultrasound;Neuromuscular re-education;Patient/family education;Manual techniques;Dry needling;Energy conservation;Taping;Passive range of motion;Compression bandaging;Manual lymph drainage;Visual/perceptual remediation/compensation   PT Next Visit Plan continue manual therapy( achilles, calf, lateral ankle ) to increase ankle range of motion. continue standing activity; consider baps board, cybex leg press, rocker board sitting or standing, Contiinue with functional strength training as tolerated.    PT Home Exercise Plan ankle 4 way t-band, gastroc stretch, standing weight shift forward and back    Consulted and  Agree with Plan of Care Patient      Patient will benefit from skilled therapeutic intervention in order to improve the following deficits  and impairments:  Abnormal gait, Decreased activity tolerance, Decreased mobility, Decreased strength, Decreased endurance, Difficulty walking, Decreased range of motion, Decreased safety awareness, Impaired flexibility  Visit Diagnosis: Pain in right ankle and joints of right foot  Stiffness of right ankle, not elsewhere classified  Difficulty in walking, not elsewhere classified  Muscle weakness (generalized)  Localized edema     Problem List There are no active problems to display for this patient.   Carney Living PT DPT  08/26/2015, 12:27 PM  [facility] [address] [place], [place], [address] Phone: [phone]   Fax:  [phone]  Name: Carl Knox MRN: [id number] Date of Birth: 1965-02-01

## 2015-08-28 ENCOUNTER — Ambulatory Visit: Payer: 59 | Admitting: Physical Therapy

## 2015-08-28 ENCOUNTER — Ambulatory Visit (HOSPITAL_COMMUNITY)
Admission: RE | Admit: 2015-08-28 | Discharge: 2015-08-28 | Disposition: A | Discharge status: Home / Self Care | Payer: 59 | Source: Ambulatory Visit | Attending: Orthopedic Surgery | Admitting: Orthopedic Surgery

## 2015-08-28 DIAGNOSIS — X58XXXA Exposure to other specified factors, initial encounter: Secondary | ICD-10-CM | POA: Diagnosis not present

## 2015-08-28 DIAGNOSIS — M25571 Pain in right ankle and joints of right foot: Secondary | ICD-10-CM

## 2015-08-28 DIAGNOSIS — R600 Localized edema: Secondary | ICD-10-CM | POA: Diagnosis not present

## 2015-08-28 DIAGNOSIS — R262 Difficulty in walking, not elsewhere classified: Secondary | ICD-10-CM | POA: Diagnosis not present

## 2015-08-28 DIAGNOSIS — M6281 Muscle weakness (generalized): Secondary | ICD-10-CM

## 2015-08-28 DIAGNOSIS — S93431A Sprain of tibiofibular ligament of right ankle, initial encounter: Secondary | ICD-10-CM | POA: Diagnosis not present

## 2015-08-28 DIAGNOSIS — S99911A Unspecified injury of right ankle, initial encounter: Secondary | ICD-10-CM | POA: Diagnosis not present

## 2015-08-28 DIAGNOSIS — M25671 Stiffness of right ankle, not elsewhere classified: Secondary | ICD-10-CM

## 2015-08-29 NOTE — Therapy (Signed)
[place], [place], [address] Phone: [phone]   Fax:  [phone]  Physical Therapy Treatment  Patient Details  Name: Mosa Hillier MRN: [id number] Date of Birth: April 07, 1965 Referring Provider: Dr Elsie Saas   Encounter Date: 08/28/2015      PT End of Session - 08/28/15 1048    Visit Number 13   Number of Visits 16   Date for PT Re-Evaluation 09/02/15   Authorization Type [facility]    PT Start Time 1024   PT Stop Time 1102   PT Time Calculation (min) 38 min   Activity Tolerance Patient tolerated treatment well   Behavior During Therapy [facility] for tasks assessed/performed      No past medical history on file.  No past surgical history on file.  There were no vitals filed for this visit.      Subjective Assessment - 08/28/15 1046    Subjective Patient reports no soreness after the last vist. Overall he feels like it is doing well.    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    Limitations Walking;Standing   How long can you sit comfortably? N/A    How long can you stand comfortably? > 20 minutes    How long can you walk comfortably? Household distances    Diagnostic tests Nothing in the system: perpatient x-rays (-)    Patient Stated Goals To get back to work    Currently in Pain? No/denies   Pain Location Ankle   Pain Orientation Right   Pain Descriptors / Indicators Aching   Pain Onset More than a month ago   Pain Frequency Occasional   Aggravating Factors  walking    Pain Relieving Factors rest, ice, meloxicam    Effect of Pain on Daily Activities going down the stairs    Multiple Pain Sites No            [facility] PT Assessment - 08/29/15 0001      AROM   Right Ankle Dorsiflexion 4     PROM   Right Ankle Dorsiflexion 8   Right Ankle Plantar Flexion 40   Right Ankle Inversion 40   Right Ankle Eversion 20     Strength   Right  Ankle Dorsiflexion 5/5   Right Ankle Plantar Flexion 5/5   Right Ankle Inversion 5/5   Right Ankle Eversion 5/5     Palpation   Palpation comment Patient continues to have slight pain in the distall achillies area.      Ambulation/Gait   Gait Comments Slight decrease in signle leg time when the patient fatigues.                      [facility] Adult PT Treatment/Exercise - 08/29/15 0001      Knee/Hip Exercises: Standing   Heel Raises Limitations 20x   Lateral Step Up Limitations 8" x20    Step Down Limitations 6" 2x10    Functional Squat Limitations 20x   SLS 3x25 sec on air-ex    Other Standing Knee Exercises step onto air-ex fwd and side to side 20x each      Modalities   Modalities Cryotherapy     Cryotherapy   Number Minutes Cryotherapy 10 Minutes   Cryotherapy Location Ankle   Type of Cryotherapy Ice pack     Manual Therapy   Manual Therapy Soft tissue mobilization;Passive ROM   Soft  tissue mobilization right gastroc, proximal lateral and then distal medial areas of tenderness   Passive ROM DF     Ankle Exercises: Stretches   Soleus Stretch 2 reps;30 seconds   Gastroc Stretch 2 reps;30 seconds     Ankle Exercises: Aerobic   Stationary Bike 33minutes Nu Step      Ankle Exercises: Standing   SLS 3 x30 sec    Other Standing Ankle Exercises 8" ch step up x20, lateral step up 8inch x 20 , step down 4 inch 2x10;  stepretro step up x 10 4 inch, Mini squats x10; marching on air-ex; step and hold onto air-ex, rebounder 2x10 cone drill 2x10                PT Education - 08/28/15 1048    Education provided Yes   Education Details continue to progress stregthening and ROM    Person(s) Educated Patient   Methods Explanation   Comprehension Verbalized understanding;Returned demonstration          PT Short Term Goals - 08/29/15 0754      PT SHORT TERM GOAL #1   Title Patient will be I w/ HEp for ankle strength and ROM    Time 4   Period Weeks    Status Achieved     PT SHORT TERM GOAL #2   Title Patient will ambulate 400' without the boot and without pain    Baseline no pain with short distances    Time 4   Period Weeks   Status Achieved     PT SHORT TERM GOAL #3   Title Patient will increase gross left ankle strength to 5/5    Time 4   Period Weeks   Status Achieved     PT SHORT TERM GOAL #4   Title Patient will increase passive dorsiflexion by 10 degrees    Baseline still limited 8/8    Time 4   Period Weeks   Status On-going           PT Long Term Goals - 08/20/15 1051      PT LONG TERM GOAL #1   Title Patient will increase active dorsiflexion to 15 degrees in order to go up and down stairs comfortably    Baseline 8 degrees    Period Weeks   Status On-going     PT LONG TERM GOAL #2   Title Patient will ambulate 3000' without pain in order to return to work    Baseline improved walking distance    Time 8   Period Weeks   Status On-going     PT LONG TERM GOAL #3   Title Patient will stand for 3 hours without self report of pain in order to return to work.    Baseline still has pain when he stands for too long    Time 8   Period Weeks   Status On-going               Plan - 08/29/15 0753    Clinical Impression Statement Patient is making good progress at this time. He had no pain with exercises today. He was 8 minutes late for his appointment. His passive range of motion is improving.    Rehab Potential Good   PT Frequency 2x / week   PT Duration 8 weeks   PT Treatment/Interventions ADLs/Self Care Home Management;Cryotherapy;Electrical Stimulation;Therapeutic activities;Iontophoresis 4mg /ml Dexamethasone;Moist Heat;Therapeutic exercise;Functional mobility training;Stair training;Gait training;Ultrasound;Neuromuscular re-education;Patient/family education;Manual techniques;Dry needling;Energy conservation;Taping;Passive range of motion;Compression bandaging;Manual  lymph drainage;Visual/perceptual  remediation/compensation   PT Next Visit Plan continue manual therapy( achilles, calf, lateral ankle ) to increase ankle range of motion. continue standing activity; consider baps board, cybex leg press, rocker board sitting or standing, Contiinue with functional strength training as tolerated.    PT Home Exercise Plan ankle 4 way t-band, gastroc stretch, standing weight shift forward and back    Consulted and Agree with Plan of Care Patient      Patient will benefit from skilled therapeutic intervention in order to improve the following deficits and impairments:  Abnormal gait, Decreased activity tolerance, Decreased mobility, Decreased strength, Decreased endurance, Difficulty walking, Decreased range of motion, Decreased safety awareness, Impaired flexibility  Visit Diagnosis: Pain in right ankle and joints of right foot  Stiffness of right ankle, not elsewhere classified  Difficulty in walking, not elsewhere classified  Muscle weakness (generalized)  Localized edema     Problem List There are no active problems to display for this patient.   Carney Living  PT DPT  08/29/2015, 7:59 AM  [facility] [address] [place], [place], [address] Phone: [phone]   Fax:  [phone]  Name: Christop Rubey MRN: [id number] Date of Birth: [birth year]

## 2015-09-02 ENCOUNTER — Ambulatory Visit: Payer: 59 | Admitting: Physical Therapy

## 2015-09-02 DIAGNOSIS — M25671 Stiffness of right ankle, not elsewhere classified: Secondary | ICD-10-CM

## 2015-09-02 DIAGNOSIS — R600 Localized edema: Secondary | ICD-10-CM

## 2015-09-02 DIAGNOSIS — M25571 Pain in right ankle and joints of right foot: Secondary | ICD-10-CM

## 2015-09-02 DIAGNOSIS — R262 Difficulty in walking, not elsewhere classified: Secondary | ICD-10-CM | POA: Diagnosis not present

## 2015-09-02 DIAGNOSIS — M6281 Muscle weakness (generalized): Secondary | ICD-10-CM | POA: Diagnosis not present

## 2015-09-02 NOTE — Therapy (Signed)
[place], [place], [address] Phone: [phone]   Fax:  [phone]  Physical Therapy Treatment  Patient Details  Name: Carl Knox MRN: [id number] Date of Birth: 08/14/1965 Referring Provider: Dr Elsie Saas   Encounter Date: 09/02/2015    No past medical history on file.  No past surgical history on file.  There were no vitals filed for this visit.      Subjective Assessment - 09/02/15 1043    Subjective Patient continues to report very little pain. he was able to do a workpout over the weekedn without pain.    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    Limitations Walking;Standing   How long can you sit comfortably? N/A    How long can you stand comfortably? > 20 minutes    How long can you walk comfortably? Household distances    Diagnostic tests Nothing in the system: perpatient x-rays (-)    Patient Stated Goals To get back to work    Pain Score 3    Pain Location Ankle   Pain Orientation Right   Pain Descriptors / Indicators Aching   Pain Type Chronic pain   Pain Onset More than a month ago   Pain Frequency Occasional   Aggravating Factors  walking    Pain Relieving Factors rest, ice, meloxicam   Effect of Pain on Daily Activities going down the stairs    Multiple Pain Sites No                         [facility] Adult PT Treatment/Exercise - 09/02/15 0001      Ambulation/Gait   Gait Comments Slight decrease in signle leg time when the patient fatigues.      Knee/Hip Exercises: Standing   Heel Raises Limitations 20x   Lateral Step Up Limitations 8" x20    Step Down Limitations 6" 2x10    Functional Squat Limitations 20x   SLS 3x25 sec on air-ex    Other Standing Knee Exercises step onto air-disc fwd and side to side 20x each      Modalities   Modalities --     Cryotherapy   Cryotherapy Location --      Manual Therapy   Manual Therapy Soft tissue mobilization;Passive ROM   Soft tissue mobilization right gastroc, proximal lateral and then distal medial areas of tenderness   Passive ROM DF     Ankle Exercises: Stretches   Soleus Stretch 2 reps;30 seconds   Gastroc Stretch 2 reps;30 seconds     Ankle Exercises: Aerobic   Stationary Bike 62minutes Nu Step      Ankle Exercises: Standing   SLS 3 x30 sec    Other Standing Ankle Exercises 8" ch step up x20, lateral step up 8inch x 20 , step down 4 inch 2x10;  stepretro step up x 10 4 inch, Mini squats x10; marching on air-ex; step and hold onto air-ex, rebounder 2x10 cone drill 2x10                  PT Short Term Goals - 09/02/15 1534      PT SHORT TERM GOAL #1   Title Patient will be I w/ HEp for ankle strength and ROM    Time 4   Period Weeks   Status Achieved     PT SHORT TERM GOAL #2   Title Patient  will ambulate 400' without the boot and without pain    Baseline no pain with short distances    Time 4   Period Weeks   Status Achieved     PT SHORT TERM GOAL #3   Title Patient will increase gross left ankle strength to 5/5    Time 4   Period Weeks   Status Achieved     PT SHORT TERM GOAL #4   Title Patient will increase passive dorsiflexion by 10 degrees    Baseline still limited 8/8    Time 4   Period Weeks   Status On-going           PT Long Term Goals - 08/20/15 1051      PT LONG TERM GOAL #1   Title Patient will increase active dorsiflexion to 15 degrees in order to go up and down stairs comfortably    Baseline 8 degrees    Period Weeks   Status On-going     PT LONG TERM GOAL #2   Title Patient will ambulate 3000' without pain in order to return to work    Baseline improved walking distance    Time 8   Period Weeks   Status On-going     PT LONG TERM GOAL #3   Title Patient will stand for 3 hours without self report of pain in order to return to work.    Baseline still has pain when he stands  for too long    Time 8   Period Weeks   Status On-going               Plan - 09/02/15 1532    Clinical Impression Statement Patient was 10 min late for appointment but next paitent was late as well. He continues to have very little pain with treatment. He was able to perfrom all exercises today ithout pain. His range is still limited but he can get to neutral. Patient declined ice.    Rehab Potential Good   PT Frequency 2x / week   PT Duration 8 weeks   PT Treatment/Interventions ADLs/Self Care Home Management;Cryotherapy;Electrical Stimulation;Therapeutic activities;Iontophoresis 4mg /ml Dexamethasone;Moist Heat;Therapeutic exercise;Functional mobility training;Stair training;Gait training;Ultrasound;Neuromuscular re-education;Patient/family education;Manual techniques;Dry needling;Energy conservation;Taping;Passive range of motion;Compression bandaging;Manual lymph drainage;Visual/perceptual remediation/compensation   PT Next Visit Plan continue manual therapy( achilles, calf, lateral ankle ) to increase ankle range of motion. continue standing activity; consider baps board, cybex leg press, rocker board sitting or standing, Contiinue with functional strength training as tolerated.    PT Home Exercise Plan ankle 4 way t-band, gastroc stretch, standing weight shift forward and back    Consulted and Agree with Plan of Care Patient      Patient will benefit from skilled therapeutic intervention in order to improve the following deficits and impairments:  Abnormal gait, Decreased activity tolerance, Decreased mobility, Decreased strength, Decreased endurance, Difficulty walking, Decreased range of motion, Decreased safety awareness, Impaired flexibility  Visit Diagnosis: Pain in right ankle and joints of right foot  Stiffness of right ankle, not elsewhere classified  Difficulty in walking, not elsewhere classified  Muscle weakness (generalized)  Localized edema     Problem  List There are no active problems to display for this patient.   Carney Living PT DPT  09/02/2015, 3:35 PM  [facility] [address] [place], [place], [address] Phone: [phone]   Fax:  [phone]  Name: Carl Knox MRN: [id number] Date of Birth: 1965-09-12

## 2015-09-04 DIAGNOSIS — S93491D Sprain of other ligament of right ankle, subsequent encounter: Secondary | ICD-10-CM | POA: Diagnosis not present

## 2015-09-05 ENCOUNTER — Ambulatory Visit (INDEPENDENT_AMBULATORY_CARE_PROVIDER_SITE_OTHER): Payer: 59 | Admitting: Podiatry

## 2015-09-05 ENCOUNTER — Encounter (INDEPENDENT_AMBULATORY_CARE_PROVIDER_SITE_OTHER): Payer: Self-pay

## 2015-09-05 ENCOUNTER — Encounter: Payer: Self-pay | Admitting: Podiatry

## 2015-09-05 VITALS — BP 115/76 | HR 62 | Resp 62

## 2015-09-05 DIAGNOSIS — M79676 Pain in unspecified toe(s): Secondary | ICD-10-CM | POA: Diagnosis not present

## 2015-09-05 DIAGNOSIS — W450XXA Nail entering through skin, initial encounter: Secondary | ICD-10-CM

## 2015-09-05 NOTE — Progress Notes (Signed)
This patient presents to the office with chief complaint of an unattached great toenail on his left foot. He says he stubbed it against her foot today. He states he initially fell pain, but there is no pain noted at this time. Patient states there is no drainage or bleeding noted from the nail. He presents the office today for an evaluation and treatment of his left big toenail  GENERAL APPEARANCE: Alert, conversant. Appropriately groomed. No acute distress.  VASCULAR: Pedal pulses are  palpable at  [facility] and PT bilateral.  Capillary refill time is immediate to all digits,  Normal temperature gradient.  Digital hair growth is present bilateral  NEUROLOGIC: sensation is normal to 5.07 monofilament at 5/5 sites bilateral.  Light touch is intact bilateral, Muscle strength normal.  MUSCULOSKELETAL: acceptable muscle strength, tone and stability bilateral.  Intrinsic muscluature intact bilateral.  Rectus appearance of foot and digits noted bilateral.   DERMATOLOGIC: skin color, texture, and turgor are within normal limits.  No preulcerative lesions or ulcers  are seen, no interdigital maceration noted.  No open lesions present.  . No drainage noted.  NAILS  The left toenail nail plate is unattached except at the proximal nail fold.  No redness or swelling noted.  Nail injury left hallux.  ROV   Removed nail plate.  No drainage noted.  No bandage applied.   RTC prn   Gardiner Barefoot DPM

## 2015-09-09 ENCOUNTER — Ambulatory Visit: Payer: 59 | Admitting: Physical Therapy

## 2015-09-09 DIAGNOSIS — M25571 Pain in right ankle and joints of right foot: Secondary | ICD-10-CM | POA: Diagnosis not present

## 2015-09-09 DIAGNOSIS — M25671 Stiffness of right ankle, not elsewhere classified: Secondary | ICD-10-CM | POA: Diagnosis not present

## 2015-09-09 DIAGNOSIS — M6281 Muscle weakness (generalized): Secondary | ICD-10-CM | POA: Diagnosis not present

## 2015-09-09 DIAGNOSIS — R600 Localized edema: Secondary | ICD-10-CM | POA: Diagnosis not present

## 2015-09-09 DIAGNOSIS — R262 Difficulty in walking, not elsewhere classified: Secondary | ICD-10-CM | POA: Diagnosis not present

## 2015-09-09 NOTE — Therapy (Signed)
[place], [place], [address] Phone: [phone]   Fax:  [phone]  Physical Therapy Treatment  Patient Details  Name: Carl Knox MRN: [id number] Date of Birth: 10-Mar-1965 Referring Provider: Dr Elsie Saas   Encounter Date: 09/09/2015      PT End of Session - 09/09/15 1715    Visit Number 14   Number of Visits 24   Date for PT Re-Evaluation 10/07/15   Authorization Type [facility]    PT Start Time [id number]   PT Stop Time 1227   PT Time Calculation (min) 42 min   Activity Tolerance Patient tolerated treatment well   Behavior During Therapy [facility] for tasks assessed/performed      No past medical history on file.  No past surgical history on file.  There were no vitals filed for this visit.      Subjective Assessment - 09/09/15 1713    Subjective Patient reports he has a feeling of instability at times but his pain level is decreasing. He has been to the MD. He has a torn ATFL. The MD would like him to continue strengthening for another few weeks. He is out of work for 2 more weeks.    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    Limitations Walking;Standing   How long can you sit comfortably? N/A    How long can you stand comfortably? > 20 minutes    How long can you walk comfortably? Household distances    Diagnostic tests Nothing in the system: perpatient x-rays (-)    Currently in Pain? Yes   Pain Score 1    Pain Location Ankle   Pain Orientation Right   Pain Descriptors / Indicators Aching   Pain Type Chronic pain   Pain Onset More than a month ago   Pain Frequency Occasional   Aggravating Factors  walking    Pain Relieving Factors rest, ice, meloxicam    Effect of Pain on Daily Activities going down the strais remains difficult             [facility] PT Assessment - 09/09/15 0001      Figure 8 Edema   Figure 8 - Right  62   Figure 8 -  Left  61.6     AROM   Right Ankle Dorsiflexion 5     PROM   Right Ankle Dorsiflexion 5   Right Ankle Plantar Flexion 42   Right Ankle Inversion 40   Right Ankle Eversion 20                     [facility] Adult PT Treatment/Exercise - 09/09/15 0001      Ambulation/Gait   Gait Comments Slight decrease in signle leg time when the patient fatigues.      Knee/Hip Exercises: Standing   Heel Raises Limitations 20x   Lateral Step Up Limitations 8" x30    Forward Step Up Limitations 8"x30   Step Down Limitations 8" 2x10    Functional Squat Limitations 20x   SLS 3x25 sec on air-ex    Other Standing Knee Exercises step onto air-disc fwd and side to side 20x each    Other Standing Knee Exercises rocker board fwd/back 2x10;      Modalities   Modalities Cryotherapy     Manual Therapy   Manual Therapy Soft tissue mobilization;Passive ROM   Soft tissue mobilization right gastroc, proximal  lateral and then distal medial areas of tenderness   Passive ROM DF     Ankle Exercises: Stretches   Soleus Stretch 2 reps;30 seconds   Gastroc Stretch 2 reps;30 seconds     Ankle Exercises: Aerobic   Stationary Bike 43minutes Nu Step      Ankle Exercises: Standing   SLS 3 x30 sec    Other Standing Ankle Exercises --                PT Education - 09/09/15 1714    Education provided Yes   Education Details continue with strengthening    Person(s) Educated Patient   Methods Explanation   Comprehension Verbalized understanding;Returned demonstration          PT Short Term Goals - 09/09/15 1723      PT SHORT TERM GOAL #1   Title Patient will be I w/ HEp for ankle strength and ROM    Time 4   Period Weeks     PT SHORT TERM GOAL #2   Title Patient will ambulate 400' without the boot and without pain    Baseline no pain with short distances    Time 4   Period Weeks   Status Achieved     PT SHORT TERM GOAL #3   Title Patient will increase gross left ankle strength to  5/5    Time 4   Period Weeks   Status Achieved     PT SHORT TERM GOAL #4   Title Patient will increase passive dorsiflexion by 10 degrees    Baseline still limited 8/8    Time 4   Period Weeks   Status On-going           PT Long Term Goals - 09/09/15 1723      PT LONG TERM GOAL #1   Title Patient will increase active dorsiflexion to 15 degrees in order to go up and down stairs comfortably    Baseline 5 degrees    Time 8   Period Weeks   Status On-going     PT LONG TERM GOAL #2   Title Patient will ambulate 3000' without pain in order to return to work    Baseline no pain with walkinhg    Time 8   Period Weeks   Status Achieved     PT LONG TERM GOAL #3   Title Patient will stand for 3 hours without self report of pain in order to return to work.    Baseline about an hour at this time    Time 8   Period Weeks   Status On-going     PT LONG TERM GOAL #4   Title Patient will go down 8 steps without pain    Baseline still having pain    Time 4   Period Weeks   Status New               Plan - 09/09/15 1715    Clinical Impression Statement The patient continues to have some limitations in single leg stability and ROM. His passive dorsi-flesion was measured at 5 degrees today. He also continues to have pain going down stairs. Overall he feels like he is making progress though. He will continue stregthening for the next 2 weeks. He will then return to the MD and make a decision about his return to work. Therapy continues to progress high level stregthening activity.    Rehab Potential Good   PT Frequency 2x / week  PT Duration 8 weeks   PT Treatment/Interventions ADLs/Self Care Home Management;Cryotherapy;Electrical Stimulation;Therapeutic activities;Iontophoresis 4mg /ml Dexamethasone;Moist Heat;Therapeutic exercise;Functional mobility training;Stair training;Gait training;Ultrasound;Neuromuscular re-education;Patient/family education;Manual techniques;Dry  needling;Energy conservation;Taping;Passive range of motion;Compression bandaging;Manual lymph drainage;Visual/perceptual remediation/compensation   PT Next Visit Plan continue manual therapy( achilles, calf, lateral ankle ) to increase ankle range of motion. continue standing activity; consider baps board, cybex leg press, rocker board sitting or standing, Contiinue with functional strength training as tolerated.    PT Home Exercise Plan ankle 4 way t-band, gastroc stretch, standing weight shift forward and back    Consulted and Agree with Plan of Care Patient      Patient will benefit from skilled therapeutic intervention in order to improve the following deficits and impairments:  Abnormal gait, Decreased activity tolerance, Decreased mobility, Decreased strength, Decreased endurance, Difficulty walking, Decreased range of motion, Decreased safety awareness, Impaired flexibility  Visit Diagnosis: Pain in right ankle and joints of right foot - Plan: PT plan of care cert/re-cert  Stiffness of right ankle, not elsewhere classified - Plan: PT plan of care cert/re-cert  Muscle weakness (generalized) - Plan: PT plan of care cert/re-cert  Difficulty in walking, not elsewhere classified - Plan: PT plan of care cert/re-cert  Localized edema - Plan: PT plan of care cert/re-cert     Problem List There are no active problems to display for this patient.   Carney Living PT DPT  09/09/2015, 5:28 PM  [facility] [address] [place], [place], [address] Phone: [phone]   Fax:  [phone]  Name: Carl Knox MRN: [id number] Date of Birth: 1965/08/17

## 2015-09-12 ENCOUNTER — Encounter: Payer: Self-pay | Admitting: Podiatry

## 2015-09-16 ENCOUNTER — Ambulatory Visit: Payer: 59 | Admitting: Physical Therapy

## 2015-09-16 DIAGNOSIS — R600 Localized edema: Secondary | ICD-10-CM

## 2015-09-16 DIAGNOSIS — M6281 Muscle weakness (generalized): Secondary | ICD-10-CM | POA: Diagnosis not present

## 2015-09-16 DIAGNOSIS — M25571 Pain in right ankle and joints of right foot: Secondary | ICD-10-CM | POA: Diagnosis not present

## 2015-09-16 DIAGNOSIS — R262 Difficulty in walking, not elsewhere classified: Secondary | ICD-10-CM

## 2015-09-16 DIAGNOSIS — M25671 Stiffness of right ankle, not elsewhere classified: Secondary | ICD-10-CM

## 2015-09-16 NOTE — Therapy (Signed)
[place], [place], [address] Phone: [phone]   Fax:  [phone]  Physical Therapy Treatment  Patient Details  Name: Carl Knox MRN: [id number] Date of Birth: 02/01/1965 Referring Provider: Dr Elsie Saas   Encounter Date: 09/16/2015      PT End of Session - 09/16/15 0803    Visit Number 15   Number of Visits 24   Date for PT Re-Evaluation 10/07/15   Authorization Type [facility]    PT Start Time 0800   PT Stop Time 0841   PT Time Calculation (min) 41 min   Activity Tolerance Patient tolerated treatment well   Behavior During Therapy [facility] for tasks assessed/performed      No past medical history on file.  No past surgical history on file.  There were no vitals filed for this visit.      Subjective Assessment - 09/16/15 0802    Subjective Patient reports every once ina while he has an internal ache. He can not think of a reason it happens.    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    Limitations Walking;Standing   How long can you sit comfortably? N/A    How long can you stand comfortably? > 20 minutes    How long can you walk comfortably? Household distances    Diagnostic tests Nothing in the system: perpatient x-rays (-)    Patient Stated Goals To get back to work    Currently in Pain? No/denies                         [facility] Adult PT Treatment/Exercise - 09/16/15 0001      Knee/Hip Exercises: Standing   Heel Raises Limitations 20x   Lateral Step Up Limitations 8" x30    Forward Step Up Limitations 8"x30   Step Down Limitations 8" 2x10    SLS 3x25 sec on air-ex      Manual Therapy   Manual Therapy Soft tissue mobilization;Passive ROM   Soft tissue mobilization right gastroc, proximal lateral and then distal medial areas of tenderness   Passive ROM DF                PT Education - 09/16/15 0803    Education provided Yes   Education Details Continue to work on [place] at home    Person(s) Educated Patient   Methods Explanation   Comprehension Verbalized understanding;Returned demonstration          PT Short Term Goals - 09/16/15 0805      PT SHORT TERM GOAL #1   Title Patient will be I w/ HEp for ankle strength and ROM    Time 4   Period Weeks   Status Achieved     PT SHORT TERM GOAL #2   Title Patient will ambulate 400' without the boot and without pain    Baseline no pain with short distances    Time 4   Period Weeks   Status Achieved     PT SHORT TERM GOAL #3   Title Patient will increase gross left ankle strength to 5/5    Time 4   Period Weeks   Status Achieved     PT SHORT TERM GOAL #4   Title Patient will increase passive dorsiflexion by 10 degrees            PT Long Term Goals -  09/09/15 1723      PT LONG TERM GOAL #1   Title Patient will increase active dorsiflexion to 15 degrees in order to go up and down stairs comfortably    Baseline 5 degrees    Time 8   Period Weeks   Status On-going     PT LONG TERM GOAL #2   Title Patient will ambulate 3000' without pain in order to return to work    Baseline no pain with walkinhg    Time 8   Period Weeks   Status Achieved     PT LONG TERM GOAL #3   Title Patient will stand for 3 hours without self report of pain in order to return to work.    Baseline about an hour at this time    Time 8   Period Weeks   Status On-going     PT LONG TERM GOAL #4   Title Patient will go down 8 steps without pain    Baseline still having pain    Time 4   Period Weeks   Status New               Plan - 09/16/15 0804    Clinical Impression Statement Patient continues to be able to perfrom higher level activity without much difficulty but he is concerened about going back to work for long days. 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    Rehab Potential Good   PT Frequency 2x / week   PT  Duration 8 weeks   PT Treatment/Interventions ADLs/Self Care Home Management;Cryotherapy;Electrical Stimulation;Therapeutic activities;Iontophoresis 4mg /ml Dexamethasone;Moist Heat;Therapeutic exercise;Functional mobility training;Stair training;Gait training;Ultrasound;Neuromuscular re-education;Patient/family education;Manual techniques;Dry needling;Energy conservation;Taping;Passive range of motion;Compression bandaging;Manual lymph drainage;Visual/perceptual remediation/compensation   PT Next Visit Plan continue manual therapy( achilles, calf, lateral ankle ) to increase ankle range of motion. continue standing activity; consider baps board, cybex leg press, rocker board sitting or standing, Contiinue with functional strength training as tolerated.    PT Home Exercise Plan ankle 4 way t-band, gastroc stretch, standing weight shift forward and back    Consulted and Agree with Plan of Care Patient      Patient will benefit from skilled therapeutic intervention in order to improve the following deficits and impairments:  Abnormal gait, Decreased activity tolerance, Decreased mobility, Decreased strength, Decreased endurance, Difficulty walking, Decreased range of motion, Decreased safety awareness, Impaired flexibility  Visit Diagnosis: Pain in right ankle and joints of right foot  Stiffness of right ankle, not elsewhere classified  Muscle weakness (generalized)  Difficulty in walking, not elsewhere classified  Localized edema     Problem List There are no active problems to display for this patient.   Carney Living PT DPT  09/16/2015, 1:23 PM  [facility] [address] [place], [place], [address] Phone: [phone]   Fax:  [phone]  Name: Carl Knox MRN: [id number] Date of Birth: 03-06-1965

## 2015-09-18 ENCOUNTER — Ambulatory Visit: Payer: 59 | Admitting: Physical Therapy

## 2015-09-18 DIAGNOSIS — R600 Localized edema: Secondary | ICD-10-CM | POA: Diagnosis not present

## 2015-09-18 DIAGNOSIS — M6281 Muscle weakness (generalized): Secondary | ICD-10-CM | POA: Diagnosis not present

## 2015-09-18 DIAGNOSIS — M25671 Stiffness of right ankle, not elsewhere classified: Secondary | ICD-10-CM | POA: Diagnosis not present

## 2015-09-18 DIAGNOSIS — S93491D Sprain of other ligament of right ankle, subsequent encounter: Secondary | ICD-10-CM | POA: Diagnosis not present

## 2015-09-18 DIAGNOSIS — M25571 Pain in right ankle and joints of right foot: Secondary | ICD-10-CM

## 2015-09-18 DIAGNOSIS — R262 Difficulty in walking, not elsewhere classified: Secondary | ICD-10-CM

## 2015-09-18 NOTE — Therapy (Addendum)
[place], [place], [address] Phone: [phone]   Fax:  [phone]  Physical Therapy Treatment  Patient Details  Name: Carl Knox MRN: [id number] Date of Birth: Dec 27, 1965 Referring Provider: Dr Elsie Saas   Encounter Date: 09/18/2015      PT End of Session - 09/18/15 2124    Visit Number 16   Number of Visits 24   Date for PT Re-Evaluation 10/07/15   Authorization Type [facility]    PT Start Time 0845   PT Stop Time 0926   PT Time Calculation (min) 41 min   Activity Tolerance Patient tolerated treatment well   Behavior During Therapy [facility] for tasks assessed/performed      No past medical history on file.  No past surgical history on file.  There were no vitals filed for this visit.      Subjective Assessment - 09/18/15 0853    Subjective Patient was sore after the last visit. He is feeling better today but he is having some general arthritic pain. He has been doing his exercises.    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    Limitations Walking;Standing   How long can you sit comfortably? N/A    How long can you stand comfortably? > 20 minutes    How long can you walk comfortably? Household distances    Diagnostic tests Nothing in the system: perpatient x-rays (-)    Currently in Pain? Yes   Pain Score 2    Pain Location Ankle   Pain Orientation Right   Pain Descriptors / Indicators Aching   Pain Type Chronic pain   Pain Onset More than a month ago   Pain Frequency Intermittent   Aggravating Factors  walking   Pain Relieving Factors rest, ice, meloxicam    Effect of Pain on Daily Activities going down the stairs                          [facility] Adult PT Treatment/Exercise - 09/18/15 0001      Knee/Hip Exercises: Standing   Heel Raises Limitations 20x   Lateral Step Up Limitations 8" x30    Forward Step Up  Limitations 8"x30   Step Down Limitations 8" 2x10    SLS 3x25 sec on air-ex    Other Standing Knee Exercises step onto air-disc fwd and side to side 20x each    Other Standing Knee Exercises rebounder blue ball 3 way 20 reps.      Manual Therapy   Manual Therapy Soft tissue mobilization;Passive ROM   Soft tissue mobilization right gastroc, proximal lateral and then distal medial areas of tenderness   Passive ROM DF                  PT Short Term Goals - 09/16/15 0805      PT SHORT TERM GOAL #1   Title Patient will be I w/ HEp for ankle strength and ROM    Time 4   Period Weeks   Status Achieved     PT SHORT TERM GOAL #2   Title Patient will ambulate 400' without the boot and without pain    Baseline no pain with short distances    Time 4   Period Weeks   Status Achieved     PT SHORT TERM GOAL #3   Title Patient will increase gross left  ankle strength to 5/5    Time 4   Period Weeks   Status Achieved     PT SHORT TERM GOAL #4   Title Patient will increase passive dorsiflexion by 10 degrees            PT Long Term Goals - 09/18/15 2113      PT LONG TERM GOAL #1   Title Patient will increase active dorsiflexion to 15 degrees in order to go up and down stairs comfortably    Baseline 5 degrees    Time 8   Period Weeks     PT LONG TERM GOAL #2   Title Patient will ambulate 3000' without pain in order to return to work    Baseline no pain with walkinhg    Time 8   Period Weeks   Status Achieved     PT LONG TERM GOAL #3   Title Patient will stand for 3 hours without self report of pain in order to return to work.    Baseline can stand for several hours    Time 8   Period Weeks   Status Achieved     PT LONG TERM GOAL #4   Title Patient will go down 8 steps without pain    Baseline still having pain    Time 4   Status On-going               Plan - 09/18/15 2106    Clinical Impression Statement Patient will go to the MD at that time they  will decide if he is to go back to work. DF remains the same. He feels like he is improving but feel like he may still not be able to go back full duty. He may benefit from work hardening. Pt will return per MD recomendation. 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    Rehab Potential Good   PT Frequency 2x / week   PT Duration 8 weeks   PT Treatment/Interventions ADLs/Self Care Home Management;Cryotherapy;Electrical Stimulation;Therapeutic activities;Iontophoresis 31m/ml Dexamethasone;Moist Heat;Therapeutic exercise;Functional mobility training;Stair training;Gait training;Ultrasound;Neuromuscular re-education;Patient/family education;Manual techniques;Dry needling;Energy conservation;Taping;Passive range of motion;Compression bandaging;Manual lymph drainage;Visual/perceptual remediation/compensation   PT Next Visit Plan continue manual therapy( achilles, calf, lateral ankle ) to increase ankle range of motion. continue standing activity; consider baps board, cybex leg press, rocker board sitting or standing, Contiinue with functional strength training as tolerated.    PT Home Exercise Plan ankle 4 way t-band, gastroc stretch, standing weight shift forward and back    Consulted and Agree with Plan of Care Patient      Patient will benefit from skilled therapeutic intervention in order to improve the following deficits and impairments:  Abnormal gait, Decreased activity tolerance, Decreased mobility, Decreased strength, Decreased endurance, Difficulty walking, Decreased range of motion, Decreased safety awareness, Impaired flexibility  Visit Diagnosis: Pain in right ankle and joints of right foot  Stiffness of right ankle, not elsewhere classified  Muscle weakness (generalized)  Difficulty in walking, not elsewhere classified  Localized edema  PHYSICAL THERAPY  DISCHARGE SUMMARY  Visits from Start of Care: 16  Current functional level related to goals / functional outcomes: Still having pain but much improved.    Remaining deficits: Pain at times  Education:  HEP  Plan: Patient agrees to discharge.  Patient goals were not met. Patient is being  discharged due to meeting the stated rehab goals.  ?????       Problem List There are no active problems to display for this patient.   Carney Living PT DPT  09/18/2015, 9:26 PM  [facility] [address] [place], [place], [address] Phone: [phone]   Fax:  [phone]  Name: Carl Knox MRN: [id number] Date of Birth: [birth year]

## 2015-10-16 DIAGNOSIS — S93491D Sprain of other ligament of right ankle, subsequent encounter: Secondary | ICD-10-CM | POA: Diagnosis not present

## 2015-12-29 ENCOUNTER — Telehealth: Payer: Self-pay | Admitting: *Deleted

## 2015-12-29 DIAGNOSIS — I10 Essential (primary) hypertension: Secondary | ICD-10-CM | POA: Diagnosis not present

## 2015-12-29 DIAGNOSIS — E782 Mixed hyperlipidemia: Secondary | ICD-10-CM | POA: Diagnosis not present

## 2015-12-29 DIAGNOSIS — Z6841 Body Mass Index (BMI) 40.0 and over, adult: Secondary | ICD-10-CM | POA: Diagnosis not present

## 2015-12-29 DIAGNOSIS — S80212A Abrasion, left knee, initial encounter: Secondary | ICD-10-CM | POA: Diagnosis not present

## 2015-12-29 DIAGNOSIS — R635 Abnormal weight gain: Secondary | ICD-10-CM | POA: Diagnosis not present

## 2015-12-29 DIAGNOSIS — M25522 Pain in left elbow: Secondary | ICD-10-CM | POA: Diagnosis not present

## 2015-12-29 DIAGNOSIS — R55 Syncope and collapse: Secondary | ICD-10-CM | POA: Diagnosis not present

## 2015-12-29 DIAGNOSIS — G932 Benign intracranial hypertension: Secondary | ICD-10-CM | POA: Diagnosis not present

## 2015-12-29 NOTE — Telephone Encounter (Signed)
Patient's wife called stating her husband "passed out at work". She stated he has seen Dr Jannifer Franklin in the past, would like him to be seen again. Patient was last seen over 3 years ago; advised her he will need a new referral. She stated he is seeing his PCP today and she will ask PCP to place referral to this office.

## 2015-12-31 MED FILL — LEVOTHYROXINE 75 MCG TABLET: 75 | 30 days supply | Qty: 30 | Fill #0

## 2016-01-09 DIAGNOSIS — G932 Benign intracranial hypertension: Secondary | ICD-10-CM | POA: Diagnosis not present

## 2016-01-09 DIAGNOSIS — I10 Essential (primary) hypertension: Secondary | ICD-10-CM | POA: Diagnosis not present

## 2016-01-09 DIAGNOSIS — E039 Hypothyroidism, unspecified: Secondary | ICD-10-CM | POA: Diagnosis not present

## 2016-01-09 DIAGNOSIS — R55 Syncope and collapse: Secondary | ICD-10-CM | POA: Diagnosis not present

## 2016-01-16 DIAGNOSIS — H5203 Hypermetropia, bilateral: Secondary | ICD-10-CM | POA: Diagnosis not present

## 2016-01-16 DIAGNOSIS — H02055 Trichiasis without entropian left lower eyelid: Secondary | ICD-10-CM | POA: Diagnosis not present

## 2016-01-16 DIAGNOSIS — H524 Presbyopia: Secondary | ICD-10-CM | POA: Diagnosis not present

## 2016-01-16 DIAGNOSIS — H4603 Optic papillitis, bilateral: Secondary | ICD-10-CM | POA: Diagnosis not present

## 2016-01-16 DIAGNOSIS — H52223 Regular astigmatism, bilateral: Secondary | ICD-10-CM | POA: Diagnosis not present

## 2016-01-29 DIAGNOSIS — G932 Benign intracranial hypertension: Secondary | ICD-10-CM | POA: Diagnosis not present

## 2016-01-29 DIAGNOSIS — E782 Mixed hyperlipidemia: Secondary | ICD-10-CM | POA: Diagnosis not present

## 2016-01-29 DIAGNOSIS — I10 Essential (primary) hypertension: Secondary | ICD-10-CM | POA: Diagnosis not present

## 2016-01-29 DIAGNOSIS — Z0000 Encounter for general adult medical examination without abnormal findings: Secondary | ICD-10-CM | POA: Diagnosis not present

## 2016-01-29 DIAGNOSIS — E039 Hypothyroidism, unspecified: Secondary | ICD-10-CM | POA: Diagnosis not present

## 2016-01-30 MED FILL — LEVOTHYROXINE 75 MCG TABLET: 75 | 90 days supply | Qty: 90 | Fill #0

## 2016-02-06 ENCOUNTER — Encounter: Payer: Self-pay | Admitting: Neurology

## 2016-02-06 ENCOUNTER — Ambulatory Visit (INDEPENDENT_AMBULATORY_CARE_PROVIDER_SITE_OTHER): Payer: 59 | Admitting: Neurology

## 2016-02-06 DIAGNOSIS — R55 Syncope and collapse: Secondary | ICD-10-CM

## 2016-02-06 HISTORY — DX: Syncope and collapse: R55

## 2016-02-06 NOTE — Progress Notes (Signed)
Reason for visit: Syncope  Referring physician: Dr. Melton Krebs is a 51 y.o. male  History of present illness:  Carl Knox is a 51 year old left-handed black male with a history of pseudotumor cerebri, seen through this office previously. The patient was treated with Diamox which he could not tolerate, and eventually was switched to Lasix. The patient has been off of all medications for 3-4 years, he continues to be followed through his ophthalmologist. The patient was last seen on 01/16/2016, he was told that there was some mild disc blurring, but this was not severe. Throughout his life, the patient has noted episodes of syncope associated with laughing. This began around age 52 or 69. The patient last had an event while at work on 12/29/2015. The patient indicates that he will start laughing and he may start feeling dizzy. If he can stop laughing, he may be able to abort a syncopal event, but on this last episode he fell to the floor injuring his left knee and left elbow. The patient was unconscious only for a few seconds, he did have true syncope. The patient may occasionally have some jerks or twitches before he goes out. The does not bite his tongue or lose control of the bowels or the bladder. He does have obstructive sleep apnea on CPAP, but he has not been using CPAP in over 10 years as this resulted in multiple episodes of pneumonia. The patient did have some recent weight gain. The patient denies any headaches and neck stiffness. The patient does have some mild gait instability. He denies issues controlling the bowels or the bladder. The patient reports no focal numbness or weakness of the face, arms, or legs. He may have some visual dimming and tunnel vision prior to the syncopal event. He denies nausea or significant diaphoresis with the events. He denies palpitations of the heart or chest pain. He is sent to this office for an evaluation.  Past Medical History:  Diagnosis  Date  . Pseudotumor cerebri     Past Surgical History:  Procedure Laterality Date  . CYST EXCISION  2001   Thyglossal duct  . TONSILLECTOMY  1980    Family History  Problem Relation Age of Onset  . Hypertension Mother   . Heart disease Mother   . Kidney disease Mother   . Diabetes Father   . Hypertension Father     Social history:  reports that he has never smoked. He has never used smokeless tobacco. He reports that he drinks alcohol. He reports that he does not use drugs.  Medications:  Prior to Admission medications   Medication Sig Start Date End Date Taking? Authorizing Provider  levothyroxine (SYNTHROID, LEVOTHROID) 75 MCG tablet  01/30/16   Historical Provider, MD  Multiple Vitamin (MULTIVITAMIN) tablet Take 1 tablet by mouth daily.    Historical Provider, MD     No Known Allergies  ROS:  Out of a complete 14 system review of symptoms, the patient complains only of the following symptoms, and all other reviewed systems are negative.  Weight gain or fatigue Swelling in the legs Moles Snoring Joint pain Passing out Insomnia, sleepiness  Blood pressure 120/73, pulse 66, height 6' (1.829 m), weight (!) 326 lb (147.9 kg).  Physical Exam  General: The patient is alert and cooperative at the time of the examination. The patient is morbidly obese.  Eyes: Pupils are equal, round, and reactive to light. Discs are flat bilaterally.  Neck: The neck is  supple, no carotid bruits are noted.  Respiratory: The respiratory examination is clear.  Cardiovascular: The cardiovascular examination reveals a regular rate and rhythm, no obvious murmurs or rubs are noted.  Skin: Extremities are without significant edema.  Neurologic Exam  Mental status: The patient is alert and oriented x 3 at the time of the examination. The patient has apparent normal recent and remote memory, with an apparently normal attention span and concentration ability.  Cranial nerves: Facial  symmetry is present. There is good sensation of the face to pinprick and soft touch bilaterally. The strength of the facial muscles and the muscles to head turning and shoulder shrug are normal bilaterally. Speech is well enunciated, no aphasia or dysarthria is noted. Extraocular movements are full. Visual fields are full. The tongue is midline, and the patient has symmetric elevation of the soft palate. No obvious hearing deficits are noted.  Motor: The motor testing reveals 5 over 5 strength of all 4 extremities. Good symmetric motor tone is noted throughout.  Sensory: Sensory testing is intact to pinprick, soft touch, vibration sensation, and position sense on all 4 extremities. No evidence of extinction is noted.  Coordination: Cerebellar testing reveals good finger-nose-finger and heel-to-shin bilaterally.  Gait and station: Gait is normal. Tandem gait is normal. Romberg is negative. No drift is seen.  Reflexes: Deep tendon reflexes are symmetric and normal bilaterally. Toes are downgoing bilaterally.   Assessment/Plan:  1. History of pseudotumor cerebri  2. Morbid obesity  3. Sleep apnea, not on CPAP  4. Gelastic syncope  The patient is reporting a lifetime of occasional episodes of dizziness or blacking out associated with laughing. The patient denies any syncope with other events such as bending or stooping, or straining. The concern would be that the laughing may be increasing the intracranial pressure and resulting in a blackout. The patient will need to be evaluated for Arnold-Chiari malformation or cardiac disease. The patient will be sent for MRI of the brain, lumbar puncture will be done if the MRI is unremarkable, the patient will be sent for a 2-D echocardiogram. In the future, a sleep evaluation to treat the sleep apnea may be indicated. The patient will follow-up in 3-4 months.  Jill Alexanders MD 02/06/2016 9:03 AM  [facility] [address]  [place], [place] [address]  Phone [phone] Fax [phone]

## 2016-02-24 ENCOUNTER — Other Ambulatory Visit: Payer: 59

## 2016-02-25 ENCOUNTER — Other Ambulatory Visit: Payer: 59

## 2016-02-26 ENCOUNTER — Ambulatory Visit
Admission: RE | Admit: 2016-02-26 | Discharge: 2016-02-26 | Disposition: A | Discharge status: Home / Self Care | Payer: 59 | Source: Ambulatory Visit | Attending: Neurology | Admitting: Neurology

## 2016-02-26 DIAGNOSIS — R55 Syncope and collapse: Secondary | ICD-10-CM

## 2016-02-27 ENCOUNTER — Other Ambulatory Visit: Payer: Self-pay

## 2016-02-27 ENCOUNTER — Ambulatory Visit (HOSPITAL_COMMUNITY): Payer: 59 | Attending: Internal Medicine

## 2016-02-27 DIAGNOSIS — I351 Nonrheumatic aortic (valve) insufficiency: Secondary | ICD-10-CM | POA: Insufficient documentation

## 2016-02-27 DIAGNOSIS — I517 Cardiomegaly: Secondary | ICD-10-CM | POA: Diagnosis not present

## 2016-02-27 DIAGNOSIS — R55 Syncope and collapse: Secondary | ICD-10-CM | POA: Insufficient documentation

## 2016-02-28 ENCOUNTER — Telehealth: Payer: Self-pay | Admitting: Neurology

## 2016-02-28 DIAGNOSIS — G932 Benign intracranial hypertension: Secondary | ICD-10-CM

## 2016-02-28 NOTE — Telephone Encounter (Signed)
I called the patient. MRI of the brain shows some ventriculomegally, unchanged from 2010. 2D echo looks normal. Will consider repeat LP, the patient is to call if he is OK with this.   2D echo 02/25/16:  Study Conclusions  - Left ventricle: The cavity size was normal. Wall thickness was normal. Systolic function was normal. The estimated ejection fraction was in the range of 60% to 65%. Left ventricular diastolic function parameters were normal. - Aortic valve: There was trivial regurgitation. - Left atrium: The atrium was mildly dilated.   MRI brain 02/25/16:  IMPRESSION:  This MRI of the brain without contrast shows the following: 1.    Mild ventriculomegaly, unchanged compared to the 11/27/2008 MRI.  Because sulci are not enlarged, this does not appear to be compensatory.   The degree of mild ventriculomegaly is unchanged when compared to the 11/27/2008 MRI. 2.    There is a "empty sella". This could be an incidental finding or due to elevated intracranial pressure.   This is unchanged when compared to the 2010 MRI. 3.    Fluid within the maxillary sinuses consistent with acute sinusitis. 4.    There are no acute findings in the brain.

## 2016-03-01 NOTE — Telephone Encounter (Signed)
Dr Jannifer Franklin- wife returning your call

## 2016-03-01 NOTE — Telephone Encounter (Signed)
I called the patient. I talk with the wife. The patient has had episodes of syncope or near-syncope associated with laughing. He has a history of pseudotumor cerebri, I will repeat the lumbar puncture to look for the opening pressure to ensure that this is not what is causing these near syncopal events.

## 2016-03-01 NOTE — Addendum Note (Signed)
Addended by: Margette Fast on: 03/01/2016 06:32 PM   Modules accepted: Orders

## 2016-03-01 NOTE — Telephone Encounter (Signed)
Patients wife called office returning call to Dr. Jannifer Franklin.  Please call

## 2016-03-08 ENCOUNTER — Encounter: Payer: Self-pay | Admitting: Neurology

## 2016-03-08 ENCOUNTER — Telehealth: Payer: Self-pay | Admitting: Neurology

## 2016-03-08 NOTE — Telephone Encounter (Signed)
I will write a letter for this purpose.

## 2016-03-08 NOTE — Telephone Encounter (Signed)
Pt's wife called said he will need a letter for work stating he needs to be off work this Thursday and Friday for medical procedure. She advised it does not need to provide he is having a LP. Says it can be faxed to her work ([phone]) [phone]. She also said she will drop off FMLA forms today, she aware there is a $50.

## 2016-03-08 NOTE — Telephone Encounter (Signed)
Dr Willis- please advise 

## 2016-03-09 NOTE — Telephone Encounter (Signed)
Called wife. Advised letter ready to pick up at front desk. She will pick up this am.   She also asked if I received FMLA paperwork yet. She stated she dropped it off yesterday. Advised we do have 7-14 days to complete. We will call once ready. She verbalized understanding.

## 2016-03-10 DIAGNOSIS — I10 Essential (primary) hypertension: Secondary | ICD-10-CM | POA: Diagnosis not present

## 2016-03-10 DIAGNOSIS — G932 Benign intracranial hypertension: Secondary | ICD-10-CM | POA: Diagnosis not present

## 2016-03-10 DIAGNOSIS — L304 Erythema intertrigo: Secondary | ICD-10-CM | POA: Diagnosis not present

## 2016-03-10 DIAGNOSIS — Z6841 Body Mass Index (BMI) 40.0 and over, adult: Secondary | ICD-10-CM | POA: Diagnosis not present

## 2016-03-10 DIAGNOSIS — L720 Epidermal cyst: Secondary | ICD-10-CM | POA: Diagnosis not present

## 2016-03-10 DIAGNOSIS — E039 Hypothyroidism, unspecified: Secondary | ICD-10-CM | POA: Diagnosis not present

## 2016-03-10 MED FILL — KETOCONAZOLE 2% CREAM: 2 | 30 days supply | Qty: 60 | Fill #0

## 2016-03-10 NOTE — Telephone Encounter (Signed)
Gave completed FMLA paperwork back to medical records to process.

## 2016-03-11 ENCOUNTER — Telehealth: Payer: Self-pay | Admitting: Neurology

## 2016-03-11 ENCOUNTER — Ambulatory Visit
Admission: RE | Admit: 2016-03-11 | Discharge: 2016-03-11 | Disposition: A | Discharge status: Home / Self Care | Payer: 59 | Source: Ambulatory Visit | Attending: Neurology | Admitting: Neurology

## 2016-03-11 VITALS — BP 121/59 | HR 59

## 2016-03-11 DIAGNOSIS — G932 Benign intracranial hypertension: Secondary | ICD-10-CM | POA: Diagnosis not present

## 2016-03-11 DIAGNOSIS — Z0289 Encounter for other administrative examinations: Secondary | ICD-10-CM

## 2016-03-11 DIAGNOSIS — R55 Syncope and collapse: Secondary | ICD-10-CM

## 2016-03-11 LAB — PROTEIN, CSF: TOTAL PROTEIN, CSF: 35 mg/dL (ref 15–45)

## 2016-03-11 LAB — CSF CELL COUNT WITH DIFFERENTIAL
RBC COUNT CSF: 2 {cells}/uL (ref 0–10)
WBC CSF: 0 {cells}/uL (ref 0–5)

## 2016-03-11 LAB — GLUCOSE, CSF: Glucose, CSF: 64 mg/dL (ref 43–76)

## 2016-03-11 MED ORDER — FUROSEMIDE 20 MG PO TABS: 20.0000 mg | ORAL_TABLET | Freq: Every day | ORAL | 3 refills | Status: DC

## 2016-03-11 MED ORDER — POTASSIUM CHLORIDE ER 10 MEQ PO TBCR: 20.0000 meq | EXTENDED_RELEASE_TABLET | Freq: Every day | ORAL | 3 refills | Status: DC

## 2016-03-11 MED FILL — KLOR-CON M10 TABLET: 10 | 30 days supply | Qty: 60 | Fill #0

## 2016-03-11 MED FILL — FUROSEMIDE 20 MG TABLET: 20 | 30 days supply | Qty: 30 | Fill #0

## 2016-03-11 NOTE — Discharge Instructions (Signed)

## 2016-03-11 NOTE — Telephone Encounter (Signed)
Took call from phone staff. Spoke with wife (on [place]). Relayed results per CW,MD note. Advised he called in rx lasix, potassium to [facility]. She verified they made 4 month f/u for 07/09/16 at 9am with CM,NP. Advised her to call if she has further questions/concerns.

## 2016-03-11 NOTE — Telephone Encounter (Signed)
Called and LVM for pt to call and schedule 4 month f/u per CW,MD request. Okay to schedule with NP.

## 2016-03-11 NOTE — Telephone Encounter (Signed)
I called patient. The patient had a lumbar puncture today, OP pressure was 36 cm. This may be the etiology of the syncopal events associated with laughing.  The patient in the past did not tolerate Diamox, I'll get him back on Lasix and potassium.  He will need follow-up in about 4 months.

## 2016-03-12 ENCOUNTER — Telehealth: Payer: Self-pay | Admitting: Radiology

## 2016-03-12 NOTE — Telephone Encounter (Signed)
Wife called because pt's back was sore at injection site. Explained he could use any OTC med he would usual use for discomfort. Also, ice pack to area might also be helpful.

## 2016-03-13 ENCOUNTER — Telehealth: Payer: Self-pay | Admitting: Neurology

## 2016-03-13 DIAGNOSIS — G971 Other reaction to spinal and lumbar puncture: Secondary | ICD-10-CM

## 2016-03-13 NOTE — Telephone Encounter (Signed)
Patient called with 2 days of headaches. He has IIH and did not tolerate diamox. He is on lasix. He had an LP last week with resultant headache. He is sleeping right now no acute distress, he appears comfortable currently. Likely post-LP headache. Told wife to have him stay flat, hydrate and caffeine. He may need a blood patch Monday. Told wife to call me back and I could send him fioricet for a few days. He is taking tylenol and ibuprofen. Please call patient Monday early for possible blood patch.

## 2016-03-15 ENCOUNTER — Telehealth: Payer: Self-pay | Admitting: Neurology

## 2016-03-15 ENCOUNTER — Other Ambulatory Visit: Payer: Self-pay | Admitting: Neurology

## 2016-03-15 ENCOUNTER — Encounter: Payer: Self-pay | Admitting: Neurology

## 2016-03-15 ENCOUNTER — Ambulatory Visit
Admission: RE | Admit: 2016-03-15 | Discharge: 2016-03-15 | Disposition: A | Discharge status: Home / Self Care | Payer: 59 | Source: Ambulatory Visit | Attending: Neurology | Admitting: Neurology

## 2016-03-15 DIAGNOSIS — G971 Other reaction to spinal and lumbar puncture: Secondary | ICD-10-CM

## 2016-03-15 DIAGNOSIS — Z5181 Encounter for therapeutic drug level monitoring: Secondary | ICD-10-CM

## 2016-03-15 MED ORDER — IOPAMIDOL (ISOVUE-M 200) INJECTION 41%
1.0000 mL | Freq: Once | INTRAMUSCULAR | Status: AC
  Administered 2016-03-15: 1 mL via EPIDURAL

## 2016-03-15 NOTE — Discharge Instructions (Signed)

## 2016-03-15 NOTE — Telephone Encounter (Signed)
I will write a note keeping out the patient from work today and tomorrow.

## 2016-03-15 NOTE — Telephone Encounter (Signed)
I called the patent, and I talked with the wife, the patient has been having headache since the LP, worse with standing. The patient is to stop the lasix until the headaches are gone and we will get a blood patch.

## 2016-03-15 NOTE — Telephone Encounter (Signed)
Patients wife called the office in reference to having patients work note revised.  Original note is to be office 03/11/16, 03/12/16 but wife states he is out today and does not think he will be able to return soon.  Please call

## 2016-03-15 NOTE — Telephone Encounter (Signed)
Spoke to [place], she sent referral for blood patch.

## 2016-03-15 NOTE — Telephone Encounter (Signed)
Dr Willis- please advise 

## 2016-03-15 NOTE — Telephone Encounter (Signed)
Pt's wife called back says she forgot to mention his left eye is red,looks like a blood vessel's broke. Is this a symptom with LP HA?

## 2016-03-15 NOTE — Addendum Note (Signed)
Addended by: Margette Fast on: 03/15/2016 07:27 AM   Modules accepted: Orders

## 2016-03-16 NOTE — Telephone Encounter (Signed)
I called the patient, talk with the wife. I have no problem with the patient going back to work tomorrow. The burst blood vessel on the surface of the eye is benign, this should not keep him from working.

## 2016-03-16 NOTE — Telephone Encounter (Signed)
Wife picked up letter. Milon Dikes gave letter to wife.

## 2016-03-16 NOTE — Telephone Encounter (Signed)
Called and spoke to wife. Advised letter ready to pick up that Dr Jannifer Franklin wrote. I read letter to her. She stated his headache has improved. However, she is concerned about broken blood vessel in left eye. He states vision a little "fuzzy". They called eye doctor today to see if he could be evaluated today after he gets off bed rest. She is not sure if this is related to LP or increased pressure. He had similar symptoms previously with pseudotumor.   She also stated he is having back pain where LP performed. Advised this is to be expected for injection site pain.  She is wondering if he is ok to return to work tomorrow since he does heavy lifting daily (about 50 lbs or more)  Advised I will send to CW,MD to advised. We will call back to advise. She verbalized understanding.

## 2016-03-26 ENCOUNTER — Telehealth: Payer: Self-pay | Admitting: Neurology

## 2016-03-26 DIAGNOSIS — Z5181 Encounter for therapeutic drug level monitoring: Secondary | ICD-10-CM

## 2016-03-26 NOTE — Telephone Encounter (Signed)
I called patient. The FMLA form has been filled out. The patient is having some episodes of dizziness with standing, he is on Lasix currently. I will have him drink plenty of fluids, check blood work next week to make sure he is not getting dehydrated on the Lasix. He could not tolerate Diamox.

## 2016-03-31 ENCOUNTER — Other Ambulatory Visit (INDEPENDENT_AMBULATORY_CARE_PROVIDER_SITE_OTHER): Payer: Self-pay

## 2016-03-31 DIAGNOSIS — Z5181 Encounter for therapeutic drug level monitoring: Secondary | ICD-10-CM

## 2016-03-31 DIAGNOSIS — Z0289 Encounter for other administrative examinations: Secondary | ICD-10-CM

## 2016-04-01 LAB — BASIC METABOLIC PANEL
BUN/Creatinine Ratio: 10 (ref 9–20)
BUN: 12 mg/dL (ref 6–24)
CALCIUM: 9.3 mg/dL (ref 8.7–10.2)
CO2: 28 mmol/L (ref 18–29)
CREATININE: 1.16 mg/dL (ref 0.76–1.27)
Chloride: 100 mmol/L (ref 96–106)
GFR calc Af Amer: 85 mL/min/{1.73_m2} (ref >59)
GFR calc non Af Amer: 74 mL/min/{1.73_m2} (ref >59)
GLUCOSE: 77 mg/dL (ref 65–99)
Potassium: 4.5 mmol/L (ref 3.5–5.2)
SODIUM: 139 mmol/L (ref 134–144)

## 2016-04-09 MED FILL — KLOR-CON M10 TABLET: 10 | 30 days supply | Qty: 60 | Fill #1

## 2016-04-09 MED FILL — FUROSEMIDE 20 MG TABLET: 20 | 30 days supply | Qty: 30 | Fill #1

## 2016-04-15 DIAGNOSIS — H1131 Conjunctival hemorrhage, right eye: Secondary | ICD-10-CM | POA: Diagnosis not present

## 2016-04-21 NOTE — Telephone Encounter (Addendum)
Pt's wife called said since 2/27 he's had 3 more burst blood vessel, alternating in the eyes. He has been to Urgent Care this past Friday 3/30 for 3rd one in the rt eye and resolved by Sunday. He's had another one today in the same eye that started on Monday 4/2. Says it looks like it is getting worse. He has an appt with PCP on Friday 4/6. Please call [phone]

## 2016-04-21 NOTE — Telephone Encounter (Signed)
I called patient, talk with the wife. 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 He does not have high blood pressure.  I will check blood work to include a CBC.

## 2016-04-21 NOTE — Addendum Note (Signed)
Addended by: Kathrynn Ducking on: 04/21/2016 05:31 PM   Modules accepted: Orders

## 2016-04-23 DIAGNOSIS — H15001 Unspecified scleritis, right eye: Secondary | ICD-10-CM | POA: Diagnosis not present

## 2016-04-23 DIAGNOSIS — G932 Benign intracranial hypertension: Secondary | ICD-10-CM | POA: Diagnosis not present

## 2016-04-23 DIAGNOSIS — I10 Essential (primary) hypertension: Secondary | ICD-10-CM | POA: Diagnosis not present

## 2016-04-23 MED FILL — LEVOTHYROXINE 75 MCG TABLET: 75 | 90 days supply | Qty: 90 | Fill #1

## 2016-04-27 ENCOUNTER — Other Ambulatory Visit (INDEPENDENT_AMBULATORY_CARE_PROVIDER_SITE_OTHER): Payer: Self-pay

## 2016-04-27 DIAGNOSIS — H1133 Conjunctival hemorrhage, bilateral: Secondary | ICD-10-CM | POA: Diagnosis not present

## 2016-04-27 DIAGNOSIS — G932 Benign intracranial hypertension: Secondary | ICD-10-CM | POA: Diagnosis not present

## 2016-04-27 DIAGNOSIS — Z5181 Encounter for therapeutic drug level monitoring: Secondary | ICD-10-CM

## 2016-04-27 DIAGNOSIS — Z0289 Encounter for other administrative examinations: Secondary | ICD-10-CM

## 2016-04-28 LAB — CBC WITH DIFFERENTIAL/PLATELET
BASOS ABS: 0.1 10*3/uL (ref 0.0–0.2)
BASOS: 1 %
EOS (ABSOLUTE): 0.2 10*3/uL (ref 0.0–0.4)
Eos: 5 %
Hematocrit: 40.9 % (ref 37.5–51.0)
Hemoglobin: 14 g/dL (ref 13.0–17.7)
IMMATURE GRANS (ABS): 0 10*3/uL (ref 0.0–0.1)
Immature Granulocytes: 0 %
LYMPHS ABS: 1 10*3/uL (ref 0.7–3.1)
Lymphs: 22 %
MCH: 26.4 pg — AB (ref 26.6–33.0)
MCHC: 34.2 g/dL (ref 31.5–35.7)
MCV: 77 fL — AB (ref 79–97)
MONOS ABS: 0.5 10*3/uL (ref 0.1–0.9)
Monocytes: 11 %
NEUTROS ABS: 2.7 10*3/uL (ref 1.4–7.0)
Neutrophils: 61 %
PLATELETS: 278 10*3/uL (ref 150–379)
RBC: 5.31 x10E6/uL (ref 4.14–5.80)
RDW: 15.4 % (ref 12.3–15.4)
WBC: 4.5 10*3/uL (ref 3.4–10.8)

## 2016-04-28 LAB — COMPREHENSIVE METABOLIC PANEL
A/G RATIO: 1.4 (ref 1.2–2.2)
ALT: 22 IU/L (ref 0–44)
AST: 28 IU/L (ref 0–40)
Albumin: 4.3 g/dL (ref 3.5–5.5)
Alkaline Phosphatase: 94 IU/L (ref 39–117)
BILIRUBIN TOTAL: 0.6 mg/dL (ref 0.0–1.2)
BUN / CREAT RATIO: 11 (ref 9–20)
BUN: 14 mg/dL (ref 6–24)
CALCIUM: 9.2 mg/dL (ref 8.7–10.2)
CHLORIDE: 102 mmol/L (ref 96–106)
CO2: 24 mmol/L (ref 18–29)
Creatinine, Ser: 1.31 mg/dL — ABNORMAL HIGH (ref 0.76–1.27)
GFR, EST AFRICAN AMERICAN: 73 mL/min/{1.73_m2} (ref >59)
GFR, EST NON AFRICAN AMERICAN: 63 mL/min/{1.73_m2} (ref >59)
GLOBULIN, TOTAL: 3.1 g/dL (ref 1.5–4.5)
Glucose: 95 mg/dL (ref 65–99)
POTASSIUM: 5.1 mmol/L (ref 3.5–5.2)
SODIUM: 141 mmol/L (ref 134–144)
TOTAL PROTEIN: 7.4 g/dL (ref 6.0–8.5)

## 2016-05-11 MED FILL — POTASSIUM CL 10 MEQ TAB SA: 10 | 30 days supply | Qty: 60 | Fill #2

## 2016-05-11 MED FILL — FUROSEMIDE 20 MG TABLET: 20 | 30 days supply | Qty: 30 | Fill #2

## 2016-05-20 DIAGNOSIS — G932 Benign intracranial hypertension: Secondary | ICD-10-CM | POA: Diagnosis not present

## 2016-06-09 MED FILL — POTASSIUM CL 10 MEQ TAB SA: 10 | 30 days supply | Qty: 60 | Fill #3

## 2016-06-09 MED FILL — FUROSEMIDE 20 MG TABLET: 20 | 30 days supply | Qty: 30 | Fill #3

## 2016-06-16 DIAGNOSIS — R238 Other skin changes: Secondary | ICD-10-CM | POA: Diagnosis not present

## 2016-06-16 DIAGNOSIS — R635 Abnormal weight gain: Secondary | ICD-10-CM | POA: Diagnosis not present

## 2016-06-16 DIAGNOSIS — Z6841 Body Mass Index (BMI) 40.0 and over, adult: Secondary | ICD-10-CM | POA: Diagnosis not present

## 2016-06-16 DIAGNOSIS — E039 Hypothyroidism, unspecified: Secondary | ICD-10-CM | POA: Diagnosis not present

## 2016-06-16 DIAGNOSIS — E236 Other disorders of pituitary gland: Secondary | ICD-10-CM | POA: Diagnosis not present

## 2016-06-16 DIAGNOSIS — R55 Syncope and collapse: Secondary | ICD-10-CM | POA: Diagnosis not present

## 2016-06-16 DIAGNOSIS — Z9889 Other specified postprocedural states: Secondary | ICD-10-CM | POA: Diagnosis not present

## 2016-06-16 DIAGNOSIS — E669 Obesity, unspecified: Secondary | ICD-10-CM | POA: Diagnosis not present

## 2016-06-17 DIAGNOSIS — E236 Other disorders of pituitary gland: Secondary | ICD-10-CM | POA: Diagnosis not present

## 2016-06-17 DIAGNOSIS — E038 Other specified hypothyroidism: Secondary | ICD-10-CM | POA: Diagnosis not present

## 2016-06-17 DIAGNOSIS — R55 Syncope and collapse: Secondary | ICD-10-CM | POA: Diagnosis not present

## 2016-06-17 DIAGNOSIS — R238 Other skin changes: Secondary | ICD-10-CM | POA: Diagnosis not present

## 2016-06-17 DIAGNOSIS — E291 Testicular hypofunction: Secondary | ICD-10-CM | POA: Diagnosis not present

## 2016-06-25 DIAGNOSIS — M150 Primary generalized (osteo)arthritis: Secondary | ICD-10-CM | POA: Diagnosis not present

## 2016-06-25 DIAGNOSIS — H02055 Trichiasis without entropian left lower eyelid: Secondary | ICD-10-CM | POA: Diagnosis not present

## 2016-06-25 DIAGNOSIS — H578 Other specified disorders of eye and adnexa: Secondary | ICD-10-CM | POA: Diagnosis not present

## 2016-06-25 DIAGNOSIS — M2550 Pain in unspecified joint: Secondary | ICD-10-CM | POA: Diagnosis not present

## 2016-06-25 DIAGNOSIS — R768 Other specified abnormal immunological findings in serum: Secondary | ICD-10-CM | POA: Diagnosis not present

## 2016-06-25 DIAGNOSIS — R55 Syncope and collapse: Secondary | ICD-10-CM | POA: Diagnosis not present

## 2016-06-25 DIAGNOSIS — H02052 Trichiasis without entropian right lower eyelid: Secondary | ICD-10-CM | POA: Diagnosis not present

## 2016-06-25 DIAGNOSIS — G932 Benign intracranial hypertension: Secondary | ICD-10-CM | POA: Diagnosis not present

## 2016-06-25 DIAGNOSIS — Z6841 Body Mass Index (BMI) 40.0 and over, adult: Secondary | ICD-10-CM | POA: Diagnosis not present

## 2016-07-01 DIAGNOSIS — G932 Benign intracranial hypertension: Secondary | ICD-10-CM | POA: Diagnosis not present

## 2016-07-09 ENCOUNTER — Ambulatory Visit (INDEPENDENT_AMBULATORY_CARE_PROVIDER_SITE_OTHER): Payer: 59 | Admitting: Nurse Practitioner

## 2016-07-09 ENCOUNTER — Encounter: Payer: Self-pay | Admitting: Nurse Practitioner

## 2016-07-09 ENCOUNTER — Other Ambulatory Visit: Payer: Self-pay | Admitting: Neurology

## 2016-07-09 ENCOUNTER — Telehealth: Payer: Self-pay | Admitting: Nurse Practitioner

## 2016-07-09 VITALS — BP 115/71 | HR 59 | Ht 72.0 in | Wt 335.2 lb

## 2016-07-09 DIAGNOSIS — Z8669 Personal history of other diseases of the nervous system and sense organs: Secondary | ICD-10-CM | POA: Diagnosis not present

## 2016-07-09 DIAGNOSIS — G932 Benign intracranial hypertension: Secondary | ICD-10-CM | POA: Diagnosis not present

## 2016-07-09 DIAGNOSIS — R55 Syncope and collapse: Secondary | ICD-10-CM | POA: Diagnosis not present

## 2016-07-09 MED ORDER — TOPIRAMATE 25 MG PO TABS: 25.0000 mg | ORAL_TABLET | Freq: Two times a day (BID) | ORAL | 6 refills | Status: DC

## 2016-07-09 MED FILL — POTASSIUM CL 10 MEQ TAB SA: 10 | 30 days supply | Qty: 60 | Fill #0

## 2016-07-09 MED FILL — FUROSEMIDE 20 MG TABLET: 20 | 30 days supply | Qty: 30 | Fill #0

## 2016-07-09 MED FILL — TOPIRAMATE 25 MG TABLET: 25 | 30 days supply | Qty: 60 | Fill #0

## 2016-07-09 NOTE — Progress Notes (Signed)
[facility]  PATIENT: Carl Knox DOB: 05-18-1965   REASON FOR VISIT: Follow-up for pseudotumor cerebri, syncopal episodes associated with laughing HISTORY FROM: Patient and wife    HISTORY OF PRESENT ILLNESS:UPDATE 06/22/2018CM Carl Knox 51 year old male returns for follow-up for pseudotumor cerebri. Patient had an LP in February with opening pressure of 36. He has not been able to tolerate Diamox in the past he was placed on Lasix and potassium. MRI of the brain shows some ventriculomegaly unchanged from 2010. 2-D echo was normal. He had BMP 03/31/2016 which was normal. He had recent visit to Dr. Valetta Close, his ophthalmologist who reported his best corrected visual acuity 20/20 continued bilateral disc edema which appears to be slightly improved in the right eye stable left. His suggestion was to be on Topamax. Blood pressure in the office today noted to be 115/71. Patient also needs to have a sleep study he has not used his CPAP years and has previous history of obstructive sleep apnea. He returns for reevaluation   02/06/16 KWMr. Knox is a 51 year old left-handed black male with a history of pseudotumor cerebri, seen through this office previously. The patient was treated with Diamox which he could not tolerate, and eventually was switched to Lasix. The patient has been off of all medications for 3-4 years, he continues to be followed through his ophthalmologist. The patient was last seen on 01/16/2016, he was told that there was some mild disc blurring, but this was not severe. Throughout his life, the patient has noted episodes of syncope associated with laughing. This began around age 82 or 66. The patient last had an event while at work on 12/29/2015. The patient indicates that he will start laughing and he may start feeling dizzy. If he can stop laughing, he may be able to abort a syncopal event, but on this last episode he fell to the floor injuring his left knee and left  elbow. The patient was unconscious only for a few seconds, he did have true syncope. The patient may occasionally have some jerks or twitches before he goes out. The does not bite his tongue or lose control of the bowels or the bladder. He does have obstructive sleep apnea on CPAP, but he has not been using CPAP in over 10 years as this resulted in multiple episodes of pneumonia. The patient did have some recent weight gain. The patient denies any headaches and neck stiffness. The patient does have some mild gait instability. He denies issues controlling the bowels or the bladder. The patient reports no focal numbness or weakness of the face, arms, or legs. He may have some visual dimming and tunnel vision prior to the syncopal event. He denies nausea or significant diaphoresis with the events. He denies palpitations of the heart or chest pain. He is sent to this office for an evaluation.   REVIEW OF SYSTEMS: Full 14 system review of systems performed and notable only for those listed, all others are neg:  Constitutional: neg  Cardiovascular: Leg swelling Ear/Nose/Throat: neg  Skin: neg Eyes: neg Respiratory: neg Gastroitestinal: neg  Hematology/Lymphatic: neg  Endocrine: neg Musculoskeletal: Joint pain Allergy/Immunology: neg Neurological: neg Psychiatric: neg Sleep : Obstructive sleep apnea   ALLERGIES: No Known Allergies  HOME MEDICATIONS: Outpatient Medications Prior to Visit  Medication Sig Dispense Refill  . furosemide (LASIX) 20 MG tablet Take 1 tablet (20 mg total) by mouth daily. 30 tablet 3  . levothyroxine (SYNTHROID, LEVOTHROID) 75 MCG tablet   3  . Multiple  Vitamin (MULTIVITAMIN) tablet Take 1 tablet by mouth daily.    . potassium chloride (K-DUR) 10 MEQ tablet Take 2 tablets (20 mEq total) by mouth daily. 60 tablet 3   No facility-administered medications prior to visit.     PAST MEDICAL HISTORY: Past Medical History:  Diagnosis Date  . Morbid obesity ([place])   .  OSA on CPAP   . Pseudotumor cerebri   . Syncope and collapse 02/06/2016    PAST SURGICAL HISTORY: Past Surgical History:  Procedure Laterality Date  . CYST EXCISION  2001   Thyglossal duct  . TONSILLECTOMY  1980    FAMILY HISTORY: Family History  Problem Relation Age of Onset  . Hypertension Mother   . Heart disease Mother   . Kidney disease Mother   . Diabetes Father   . Hypertension Father     SOCIAL HISTORY: Social History   Social History  . Marital status: Married    Spouse name: N/A  . Number of children: 2  . Years of education: Some grad school   Occupational History  . [employer]    Social History Main Topics  . Smoking status: Never Smoker  . Smokeless tobacco: Never Used  . Alcohol use 0.0 oz/week     Comment: Occasional  . Drug use: No  . Sexual activity: Not on file     Comment: Married   Other Topics Concern  . Not on file   Social History Narrative   Lives at home w/ his wife   Left-handed   Caffeine: 2 cups daily     PHYSICAL EXAM  Vitals:   07/09/16 1003  BP: 115/71  Pulse: (!) 59  Weight: (!) 335 lb 3.2 oz (152 kg)  Height: 6' (1.829 m)   Body mass index is 45.46 kg/m.  Generalized: Well developed, morbidly obese male in no acute distress  Head: normocephalic and atraumatic,. Oropharynx benign  Neck: Supple, no carotid bruits  Cardiac: Regular rate rhythm, no murmur  Musculoskeletal: No deformity   Neurological examination   Mentation: Alert oriented to time, place, history taking. Attention span and concentration appropriate. Recent and remote memory intact.  Follows all commands speech and language fluent.   Cranial nerve II-XII: Fundoscopic exam reveals mild papilledema in the right Pupils were equal round reactive to light extraocular movements were full, visual field were full on confrontational test. Facial sensation and strength were normal. hearing was intact to finger rubbing bilaterally. Uvula tongue midline. head  turning and shoulder shrug were normal and symmetric.Tongue protrusion into cheek strength was normal. Motor: normal bulk and tone, full strength in the BUE, BLE, fine finger movements normal, no pronator drift. No focal weakness Sensory: normal and symmetric to light touch, pinprick, and  Vibration, in the upper and lower extremities Coordination: finger-nose-finger, heel-to-shin bilaterally, no dysmetria Reflexes: Symmetric upper and lower plantar responses were flexor bilaterally. Gait and Station: Rising up from seated position without assistance, normal stance,  moderate stride, good arm swing, smooth turning, able to perform tiptoe, and heel walking without difficulty. Tandem gait is steady  DIAGNOSTIC DATA (LABS, IMAGING, TESTING) - I reviewed patient records, labs, notes, testing and imaging myself where available.  Lab Results  Component Value Date   WBC 4.5 04/27/2016   HGB 14.0 04/27/2016   HCT 40.9 04/27/2016   MCV 77 (L) 04/27/2016   PLT 278 04/27/2016      Component Value Date/Time   NA 141 04/27/2016 1136   K 5.1 04/27/2016 1136  CL 102 04/27/2016 1136   CO2 24 04/27/2016 1136   GLUCOSE 95 04/27/2016 1136   BUN 14 04/27/2016 1136   CREATININE 1.31 (H) 04/27/2016 1136   CALCIUM 9.2 04/27/2016 1136   PROT 7.4 04/27/2016 1136   ALBUMIN 4.3 04/27/2016 1136   AST 28 04/27/2016 1136   ALT 22 04/27/2016 1136   ALKPHOS 94 04/27/2016 1136   BILITOT 0.6 04/27/2016 1136   GFRNONAA 63 04/27/2016 1136   GFRAA 73 04/27/2016 1136    ASSESSMENT AND PLAN  50 y.o. year old male  has a past medical history of Morbid obesity ([place]); OSA on CPAP; Pseudotumor cerebri; and Syncope and collapse (02/06/2016). here To follow-up.  LP in February with opening pressure of 36. He has not been able to tolerate Diamox in the past he was placed on Lasix and potassium. MRI of the brain shows some ventriculomegaly unchanged from 2010. 2-D echo was normal. He had BMP 03/31/2016 which was normal.  He had recent visit to Dr. Valetta Close, his ophthalmologist who reported his best corrected visual acuity 20/20 continued bilateral disc edema which appears to be slightly improved in the right eye stable left. His suggestion was to be on Topamax.  PLAN: Continue Lasix 20mg  daily Add Topamax 25 mg daily for 1 week then increase to 1 twice daily Will set up for sleep evaluation for obstructive sleep apnea I explained in particular the risks and ramifications of untreated moderate to severe OSA, especially with respect to cardiovascular disease  including congestive heart failure, difficult to treat hypertension, cardiac arrhythmias, or stroke. Even type 2 diabetes has, in part, been linked to untreated OSA. Symptoms of untreated OSA include daytime sleepiness, memory problems, mood irritability and mood disorder such as depression and anxiety, lack of energy, as well as recurrent headaches, especially morning headaches. We talked about trying to maintain a healthy lifestyle in general, as well as the importance of weight control. I encouraged the patient to eat healthy, exercise daily and keep well hydrated, to keep a scheduled bedtime and wake time routine, to not skip any meals and eat healthy snacks in between meals F/U in 3 months I spent 56minin total face to face time with the patient more than 50% of which was spent counseling and coordination of care, reviewing test results reviewing medications and discussing and reviewing the diagnosis of pseudotumor cerebri and untreated sleep apnea and further treatment options. , Rayburn Ma, [facility], APRN  [facility] [address], [place], [place] [address] [phone]

## 2016-07-09 NOTE — Progress Notes (Signed)
I have read the note, and I agree with the clinical assessment and plan.  Sadiya Durand KEITH   

## 2016-07-09 NOTE — Patient Instructions (Addendum)
Continue Lasix 20mg  daily Add Topamax 25 mg daily for 1 week then increase to 1 twice daily Will set up for sleep evaluation F/U in 3 months I explained in particular the risks and ramifications of untreated moderate to severe OSA, especially with respect to cardiovascular disease  including congestive heart failure, difficult to treat hypertension, cardiac arrhythmias, or stroke. Even type 2 diabetes has, in part, been linked to untreated OSA. Symptoms of untreated OSA include daytime sleepiness, memory problems, mood irritability and mood disorder such as depression and anxiety, lack of energy, as well as recurrent headaches, especially morning headaches. We talked about trying to maintain a healthy lifestyle in general, as well as the importance of weight control. I encouraged the patient to eat healthy, exercise daily and keep well hydrated, to keep a scheduled bedtime and wake time routine, to not skip any meals and eat healthy snacks in between meals

## 2016-07-09 NOTE — Telephone Encounter (Signed)
Pt's daughter came back into the lobby to request refill of Lasix. Said patient is out of this med. Best call back is [phone]

## 2016-07-09 NOTE — Telephone Encounter (Signed)
LMVM for  Darlene that sent refills for both potassium and lasix to [place].

## 2016-07-14 ENCOUNTER — Telehealth: Payer: Self-pay | Admitting: Neurology

## 2016-07-14 NOTE — Telephone Encounter (Signed)
This patient was seen by Dr. Jola Schmidt from [facility] ophthalmology. The patient is being followed for pseudotumor cerebri. His evaluation continued to show bilateral disc edema that is slightly improved on the right eye and stable in the left eye.

## 2016-07-19 DIAGNOSIS — E291 Testicular hypofunction: Secondary | ICD-10-CM | POA: Diagnosis not present

## 2016-07-29 DIAGNOSIS — Z6841 Body Mass Index (BMI) 40.0 and over, adult: Secondary | ICD-10-CM | POA: Diagnosis not present

## 2016-07-29 DIAGNOSIS — M2550 Pain in unspecified joint: Secondary | ICD-10-CM | POA: Diagnosis not present

## 2016-07-29 DIAGNOSIS — R55 Syncope and collapse: Secondary | ICD-10-CM | POA: Diagnosis not present

## 2016-07-29 DIAGNOSIS — E230 Hypopituitarism: Secondary | ICD-10-CM | POA: Diagnosis not present

## 2016-07-29 DIAGNOSIS — R238 Other skin changes: Secondary | ICD-10-CM | POA: Diagnosis not present

## 2016-07-29 DIAGNOSIS — E669 Obesity, unspecified: Secondary | ICD-10-CM | POA: Diagnosis not present

## 2016-07-29 DIAGNOSIS — R768 Other specified abnormal immunological findings in serum: Secondary | ICD-10-CM | POA: Diagnosis not present

## 2016-07-29 DIAGNOSIS — E039 Hypothyroidism, unspecified: Secondary | ICD-10-CM | POA: Diagnosis not present

## 2016-07-29 DIAGNOSIS — G932 Benign intracranial hypertension: Secondary | ICD-10-CM | POA: Diagnosis not present

## 2016-07-29 DIAGNOSIS — E782 Mixed hyperlipidemia: Secondary | ICD-10-CM | POA: Diagnosis not present

## 2016-07-29 DIAGNOSIS — R635 Abnormal weight gain: Secondary | ICD-10-CM | POA: Diagnosis not present

## 2016-07-29 DIAGNOSIS — I10 Essential (primary) hypertension: Secondary | ICD-10-CM | POA: Diagnosis not present

## 2016-07-29 DIAGNOSIS — Z9889 Other specified postprocedural states: Secondary | ICD-10-CM | POA: Diagnosis not present

## 2016-07-29 DIAGNOSIS — E236 Other disorders of pituitary gland: Secondary | ICD-10-CM | POA: Diagnosis not present

## 2016-07-29 DIAGNOSIS — R6882 Decreased libido: Secondary | ICD-10-CM | POA: Diagnosis not present

## 2016-07-29 MED FILL — LEVOTHYROXINE 100 MCG TABLE: 100 | 90 days supply | Qty: 90 | Fill #0

## 2016-08-02 ENCOUNTER — Encounter: Payer: Self-pay | Admitting: Neurology

## 2016-08-02 ENCOUNTER — Ambulatory Visit (INDEPENDENT_AMBULATORY_CARE_PROVIDER_SITE_OTHER): Payer: 59 | Admitting: Neurology

## 2016-08-02 ENCOUNTER — Telehealth: Payer: Self-pay | Admitting: Neurology

## 2016-08-02 VITALS — BP 122/79 | HR 68 | Ht 72.0 in | Wt 332.0 lb

## 2016-08-02 DIAGNOSIS — R635 Abnormal weight gain: Secondary | ICD-10-CM | POA: Diagnosis not present

## 2016-08-02 DIAGNOSIS — R55 Syncope and collapse: Secondary | ICD-10-CM | POA: Diagnosis not present

## 2016-08-02 DIAGNOSIS — Z6841 Body Mass Index (BMI) 40.0 and over, adult: Secondary | ICD-10-CM

## 2016-08-02 DIAGNOSIS — G4733 Obstructive sleep apnea (adult) (pediatric): Secondary | ICD-10-CM | POA: Diagnosis not present

## 2016-08-02 DIAGNOSIS — R400 Somnolence: Secondary | ICD-10-CM | POA: Diagnosis not present

## 2016-08-02 DIAGNOSIS — G932 Benign intracranial hypertension: Secondary | ICD-10-CM | POA: Diagnosis not present

## 2016-08-02 DIAGNOSIS — Z789 Other specified health status: Secondary | ICD-10-CM | POA: Diagnosis not present

## 2016-08-02 NOTE — Progress Notes (Signed)
Subjective:    Patient ID: Carl Knox is a 51 y.o. male.  HPI     Star Age, MD, PhD [facility] [address] [place], [place] [address]  Dear Hoyle Sauer and Lanny Hurst,   I saw your patient, Carl Knox, upon your kind request in my clinic today for initial consultation of his sleep disorder, in particular re-evaluation of his OSA. The patient is accompanied by his wife today. As you know, Carl Knox is a 66 year old right-handed gentleman with an underlying medical history of pseudotumor cerebri, syncope, hypothyroidism and morbid obesity, who was previously diagnosed with obstructive sleep apnea in 1999 and placed on CPAP therapy. Prior sleep study results are not available for my review today. He has not been using CPAP. He was also tried on BiPAP but is not using BiPAP therapy there. He could not tolerate it. A compliance download is not available for my review today. I reviewed your office note from 07/09/2016. His Epworth sleepiness score is 22 out of 24 today, fatigue score is 40 out of 63. His wife kept written records and states, his CPAP was started in 7/99 and then he was switched to BiPAP in 11/01, but did not tolerate either at the time, and had recurrent pneumonias. He has had worsening nasal congestion, had thyroglossal duct cyst surgery in Sept 2000, and FU with ENT in 2001. He had a TE as a child. He has nasal congestion, mouth breathing, also worsening. He Would be willing to get retested and try CPAP again. His mother has sleep apnea and uses a CPAP machine. He has dozed off at the wheel briefly, no issues with sleepiness at work are reported. He works in [employer], educational, typically from 7 AM to 3 PM, once or twice per week over time with 12 hour shifts, 7 AM to 7 PM. Bedtime is late, around midnight, wakeup time for work between 5 and 6 AM. He was originally diagnosed with pseudotumor cerebri in 2011. He has gained weight in the past few  years. He is a nonsmoker, drinks alcohol very occasionally, maybe once a month or so, so dose of a daily basis, 2-3 cans per day typically. He has nocturia about once or twice per hour tonight, denies morning headaches.     His Past Medical History Is Significant For: Past Medical History:  Diagnosis Date  . Morbid obesity ([place])   . OSA on CPAP   . Pseudotumor cerebri   . Syncope and collapse 02/06/2016    His Past Surgical History Is Significant For: Past Surgical History:  Procedure Laterality Date  . CYST EXCISION  2001   Thyglossal duct  . TONSILLECTOMY  1980    His Family History Is Significant For: Family History  Problem Relation Age of Onset  . Hypertension Mother   . Heart disease Mother   . Kidney disease Mother   . Diabetes Father   . Hypertension Father     His Social History Is Significant For: Social History   Social History  . Marital status: Married    Spouse name: N/A  . Number of children: 2  . Years of education: Some grad school   Occupational History  . [employer]    Social History Main Topics  . Smoking status: Never Smoker  . Smokeless tobacco: Never Used  . Alcohol use 0.0 oz/week     Comment: Occasional  . Drug use: No  . Sexual activity: Not Asked  Comment: Married   Other Topics Concern  . None   Social History Narrative   Lives at home w/ his wife   Left-handed   Caffeine: 2 cups daily    His Allergies Are:  No Known Allergies:   His Current Medications Are:  Outpatient Encounter Prescriptions as of 08/02/2016  Medication Sig  . furosemide (LASIX) 20 MG tablet TAKE 1 TABLET (20 MG TOTAL) BY MOUTH DAILY.  Marland Kitchen levothyroxine (SYNTHROID, LEVOTHROID) 100 MCG tablet Take 100 mcg by mouth daily.  . Multiple Vitamin (MULTIVITAMIN) tablet Take 1 tablet by mouth daily.  . potassium chloride (K-DUR,KLOR-CON) 10 MEQ tablet TAKE 2 TABLETS BY MOUTH DAILY.  Marland Kitchen topiramate (TOPAMAX) 25 MG tablet Take 1 tablet (25 mg total) by mouth 2 (two)  times daily. 1 by mouth daily for 1 week then 1 twice daily  . [DISCONTINUED] levothyroxine (SYNTHROID, LEVOTHROID) 75 MCG tablet    No facility-administered encounter medications on file as of 08/02/2016.   :  Review of Systems:  Out of a complete 14 point review of systems, all are reviewed and negative with the exception of these symptoms as listed below: Review of Systems  Neurological:       Pt presents today to discuss his sleep. Pt last had a sleep study in 1999. Pt has a cpap and bipap at home that he does not currently use. Pt did not tolerate either machine at all.  Epworth Sleepiness Scale 0= would never doze 1= slight chance of dozing 2= moderate chance of dozing 3= high chance of dozing  Sitting and reading: 3 Watching TV: 3 Sitting inactive in a public place (ex. Theater or meeting): 3 As a passenger in a car for an hour without a break: 3 Lying down to rest in the afternoon: 3 Sitting and talking to someone: 3 Sitting quietly after lunch (no alcohol): 3 In a car, while stopped in traffic: 1 Total: 22     Objective:  Neurological Exam  Physical Exam Physical Examination:   Vitals:   08/02/16 1532  BP: 122/79  Pulse: 68    General Examination: The patient is a very pleasant 51 y.o. male in no acute distress. He appears well-developed and well-nourished and well groomed.   HEENT: Normocephalic, atraumatic, pupils are equal, round and reactive to light and accommodation. Funduscopic exam is normal with sharp disc margins noted. Extraocular tracking is good without limitation to gaze excursion or nystagmus noted. Normal smooth pursuit is noted. Hearing is grossly intact. Tympanic membranes are clear bilaterally. Face is symmetric with normal facial animation and normal facial sensation. Speech is clear with no dysarthria noted. There is no hypophonia. There is no lip, neck/head, jaw or voice tremor. Neck is supple with full range of passive and active motion.  There are no carotid bruits on auscultation. Oropharynx exam reveals: mild mouth dryness, adequate dental hygiene and marked airway crowding, due to large tongue, smaller airway entry, larger uvula. Mallampati is class II, tonsils absent. Tongue protrudes centrally and palate elevates symmetrically. Neck size is 19 inches. He has a Mild overbite. Nasal inspection reveals mild nasal mucosal bogginess, no redness and no septal deviation, some inf turb hypertrophy.   Chest: Clear to auscultation without wheezing, rhonchi or crackles noted.  Heart: S1+S2+0, regular and normal without murmurs, rubs or gallops noted.   Abdomen: Soft, non-tender and non-distended with normal bowel sounds appreciated on auscultation.  Extremities: There is no pitting edema in the distal lower extremities bilaterally. Pedal pulses are intact.  Skin: Warm and dry without trophic changes noted.  Musculoskeletal: exam reveals no obvious joint deformities, tenderness or joint swelling or erythema.   Neurologically:  Mental status: The patient is awake, alert and oriented in all 4 spheres. His immediate and remote memory, attention, language skills and fund of knowledge are appropriate. There is no evidence of aphasia, agnosia, apraxia or anomia. Speech is clear with normal prosody and enunciation. Thought process is linear. Mood is normal and affect is blunted.  Cranial nerves II - XII are as described above under HEENT exam. In addition: shoulder shrug is normal with equal shoulder height noted. Motor exam: Normal bulk, strength and tone is noted. There is no drift, tremor or rebound. Romberg is negative. Reflexes are 1+ throughout. Fine motor and coordination: intact with normal finger taps, normal hand movements, normal rapid alternating patting, normal foot taps and normal foot agility.  Cerebellar testing: No dysmetria or intention tremor on finger to nose testing. Heel to shin is unremarkable bilaterally. There is no  truncal or gait ataxia.  Sensory exam: intact to light touch in the upper and lower extremities.  Gait, station and balance: He stands easily. No veering to one side is noted. No leaning to one side is noted. Posture is age-appropriate and stance is narrow based. Gait shows normal stride length and normal pace. No problems turning are noted. Tandem walk is unremarkable.                Assessment and plan:  In summary, Carl Knox is a very pleasant 51 y.o.-year old male with an underlying medical history of pseudotumor cerebri, syncope, hypothyroidism and morbid obesity, whose history and physical exam are in keeping with obstructive sleep apnea (OSA). For his nasal congestion and difficulty breathing through is nose, he is advised to seek consultation with ENT. He will need attended sleep study testing, especially in light of previous difficulty tolerating CPAP and BiPAP therapy.  I had a long chat with the patient and his wife about my findings and the diagnosis of OSA, its prognosis and treatment options. We talked about medical treatments, surgical interventions and non-pharmacological approaches. I explained in particular the risks and ramifications of untreated moderate to severe OSA, especially with respect to developing cardiovascular disease down the Road, including congestive heart failure, difficult to treat hypertension, cardiac arrhythmias, or stroke. Even type 2 diabetes has, in part, been linked to untreated OSA. Symptoms of untreated OSA include daytime sleepiness, memory problems, mood irritability and mood disorder such as depression and anxiety, lack of energy, as well as recurrent headaches, especially morning headaches. We talked about trying to maintain a healthy lifestyle in general, as well as the importance of weight control. I encouraged the patient to eat healthy, exercise daily and keep well hydrated, to keep a scheduled bedtime and wake time routine, to not skip any meals and eat  healthy snacks in between meals. I advised the patient not to drive when feeling sleepy. I recommended the following at this time: sleep study with potential positive airway pressure titration. (We will score hypopneas at 3%).   I explained the sleep test procedure to the patient and also outlined possible surgical and non-surgical treatment options of OSA. I also explained the CPAP treatment option to the patient, who indicated that he would be willing to try CPAP again if the need arises. I explained the importance of being compliant with PAP treatment, not only for insurance purposes but primarily to improve His symptoms, and for  the patient's long term health benefit, including to reduce His cardiovascular risks. I answered all their questions today and the patient and his wife were in agreement. I would like to see him back after the sleep study is completed and encouraged him to call with any interim questions, concerns, problems or updates.   Thank you very much for allowing me to participate in the care of this nice patient. If I can be of any further assistance to you please do not hesitate to talk to me.  Sincerely,   Star Age, MD, PhD

## 2016-08-02 NOTE — Patient Instructions (Addendum)
Based on your symptoms and your exam I believe you are at risk for obstructive sleep apnea or OSA, and I think we should proceed with a sleep study to determine whether you do or do not have OSA and how severe it is. If you have more than mild OSA, I want you to consider treatment with CPAP. Please remember, the risks and ramifications of moderate to severe obstructive sleep apnea or OSA are: Cardiovascular disease, including congestive heart failure, stroke, difficult to control hypertension, arrhythmias, and even type 2 diabetes has been linked to untreated OSA. Sleep apnea causes disruption of sleep and sleep deprivation in most cases, which, in turn, can cause recurrent headaches, problems with memory, mood, concentration, focus, and vigilance. Most people with untreated sleep apnea report excessive daytime sleepiness, which can affect their ability to drive. Please do not drive if you feel sleepy.   Please try to limit your caffeine intake to 1 per day. Please try to see ENT for your nasal congestion.   I will likely see you back after your sleep study to go over the test results and where to go from there. We will call you after your sleep study to advise about the results (most likely, you will hear from Beverlee Nims, my nurse) and to set up an appointment at the time, as necessary.    Our sleep lab administrative assistant, Arrie Aran will meet with you or call you to schedule your sleep study. If you don't hear back from her by next week please feel free to call her at [phone]. This is her direct line and please leave a message with your phone number to call back if you get the voicemail box. She will call back as soon as possible.

## 2016-08-02 NOTE — Telephone Encounter (Signed)
This patient has been seen by Dr. Trudie Reed, the patient had a positive ANA, this was felt to be a false positive. Is not felt that the patient has an autoimmune disease.

## 2016-08-10 MED FILL — FUROSEMIDE 20 MG TABLET: 20 | 30 days supply | Qty: 30 | Fill #1

## 2016-08-10 MED FILL — TOPIRAMATE 25 MG TABLET: 25 | 30 days supply | Qty: 60 | Fill #1

## 2016-08-10 MED FILL — POTASSIUM CL 10 MEQ TAB SA: 10 | 30 days supply | Qty: 60 | Fill #1

## 2016-08-18 ENCOUNTER — Institutional Professional Consult (permissible substitution): Payer: 59 | Admitting: Neurology

## 2016-08-20 ENCOUNTER — Ambulatory Visit (INDEPENDENT_AMBULATORY_CARE_PROVIDER_SITE_OTHER): Payer: 59 | Admitting: Neurology

## 2016-08-20 DIAGNOSIS — G4733 Obstructive sleep apnea (adult) (pediatric): Secondary | ICD-10-CM

## 2016-08-20 DIAGNOSIS — G4720 Circadian rhythm sleep disorder, unspecified type: Secondary | ICD-10-CM

## 2016-08-25 NOTE — Procedures (Signed)
PATIENT'S NAME:  Carl Knox, Carl Knox DOB:      11/02/1965      MR#:    [id number]     DATE OF RECORDING: 08/20/2016 REFERRING M.D.: Cecille Rubin, NP, PCP: Leeroy Cha, MD Study Performed:  Split-Night Titration Study HISTORY: 51 year old man with a history of pseudotumor cerebri, syncope, hypothyroidism and morbid obesity, who was previously diagnosed with obstructive sleep apnea in 1999 and placed on CPAP therapy. Prior sleep study results are not available for my review today. He has not been using CPAP. He was also tried on BiPAP. The patient endorsed the Epworth Sleepiness Scale at 22 points. The patient's weight 332 pounds with a height of 72 (inches), resulting in a BMI of 45.1 kg/m2. The patient's neck circumference measured 19 inches.  CURRENT MEDICATIONS: Lasix, Synthroid, Multivitamin , K-Dur, Topomax  PROCEDURE:  This is a multichannel digital polysomnogram utilizing the Somnostar 11.2 system.  Electrodes and sensors were applied and monitored per AASM Specifications.   EEG, EOG, Chin and Limb EMG, were sampled at 200 Hz.  ECG, Snore and Nasal Pressure, Thermal Airflow, Respiratory Effort, CPAP Flow and Pressure, Oximetry was sampled at 50 Hz. Digital video and audio were recorded.      BASELINE STUDY WITHOUT CPAP RESULTS:  Lights Out was at 22:00 and Lights On at 04:58 for the night, split start at 00:14, epoch 275. Total recording time (TRT) was 145.5, with a total sleep time (TST) of 130.5 minutes.   The patient's sleep latency was 22.5 minutes.  REM latency was 89.5 minutes.  The sleep efficiency was 89.7 %.    SLEEP ARCHITECTURE: WASO (Wake after sleep onset) was 2.5 minutes, Stage N1 was 5.5 minutes, Stage N2 was 112 minutes, Stage N3 was 0 minutes and Stage R (REM sleep) was 13 minutes.  The percentages were Stage N1 4.2%, Stage N2 85.8%, which is markedly increased, Stage N3 was absent, and Stage R (REM sleep)10.%. The arousals were noted as: 31 were spontaneous, 0 were  associated with PLMs, 113 were associated with respiratory events.   Audio and video analysis did not show any abnormal or unusual movements, behaviors, phonations or vocalizations.  The patient took 1 bathroom break for the night. Moderate snoring was noted. The EKG was in keeping with normal sinus rhythm (NSR).   RESPIRATORY ANALYSIS:  There were a total of 144 respiratory events:  57 obstructive apneas, 0 central apneas and 1 mixed apneas with a total of 58 apneas and an apnea index (AI) of 26.7. There were 86 hypopneas with a hypopnea index of 39.5. The patient also had 0 respiratory event related arousals (RERAs).  Snoring was noted.     The total APNEA/HYPOPNEA INDEX (AHI) was 66.2 /hour and the total RESPIRATORY DISTURBANCE INDEX was 66.2 /hour.  10 events occurred in REM sleep and 154 events in NREM. The REM AHI was 46.2, /hour versus a non-REM AHI of 68.4 /hour. The patient spent 309 minutes sleep time in the supine position 92 minutes in non-supine. The supine AHI was 66.5 /hour versus a non-supine AHI of 66.1 /hour.  OXYGEN SATURATION & C02:  The wake baseline 02 saturation was 95%, with the lowest being 78%. Time spent below 89% saturation equaled 75 minutes.  PERIODIC LIMB MOVEMENTS: The patient had a total of 0 Periodic Limb Movements.  The Periodic Limb Movement (PLM) index was 0 /hour and the PLM Arousal index was 0 /hour.  TITRATION STUDY WITH CPAP RESULTS:   The patient was fitted with a  medium Simplus FFM. CPAP was initiated at 5 cmH20 with heated humidity per AASM split night standards and pressure was advanced to 17 cmH20 because of hypopneas, apneas and desaturations.  At a PAP pressure of 16 cmH20, there was a reduction of the AHI to 0/hour, with supine REM sleep achieved and O2 nadir of 91%.   Total recording time (TRT) was 273.5 minutes, with a total sleep time (TST) of 270 minutes. The patient's sleep latency was 2.5 minutes. REM latency was 65.5 minutes.  The sleep  efficiency was 98.7 %.    SLEEP ARCHITECTURE: Wake after sleep was 1 minutes, Stage N1 2 minutes, Stage N2 139.5 minutes, Stage N3 15 minutes and Stage R (REM sleep) 113.5 minutes. The percentages were: Stage N1 .7%, Stage N2 51.7%, Stage N3 5.6% and Stage R (REM sleep) 42.%, which is in keeping with rebound.   The arousals were noted as: 14 were spontaneous, 0 were associated with PLMs, 34 were associated with respiratory events.  RESPIRATORY ANALYSIS:  There were a total of 52 respiratory events: 15 obstructive apneas, 0 central apneas and 0 mixed apneas with a total of 15 apneas and an apnea index (AI) of 3.3. There were 37 hypopneas with a hypopnea index of 8.2 /hour. The patient also had 0 respiratory event related arousals (RERAs).      The total APNEA/HYPOPNEA INDEX  (AHI) was 11.6 /hour and the total RESPIRATORY DISTURBANCE INDEX was 11.6 /hour.  13 events occurred in REM sleep and 39 events in NREM. The REM AHI was 6.9 /hour versus a non-REM AHI of 15. /hour. REM sleep was achieved on a pressure of  cm/h2o (AHI was  .) The patient spent 99% of total sleep time in the supine position. The supine AHI was 11.7 /hour, versus a non-supine AHI of 0.0/hour.  OXYGEN SATURATION & C02:  The wake baseline 02 saturation was 96%, with the lowest being 85%. Time spent below 89% saturation equaled 17 minutes.  PERIODIC LIMB MOVEMENTS: The patient had a total of 0 Periodic Limb Movements. The Periodic Limb Movement (PLM) index was 0 /hour and the PLM Arousal index was 0 /hour.  A post-study questionnaire was not completed by the patient.  POLYSOMNOGRAPHY IMPRESSION :   1. Obstructive Sleep Apnea(OSA)  2. Dysfunctions associated with sleep stages or arousals from sleep  RECOMMENDATIONS:  1. This patient has severe obstructive sleep apnea and responded well on CPAP therapy. I will, therefore, start the patient on home CPAP treatment at a pressure of 16 cm via medium FFM, with heated humidity. The  patient should be reminded to be fully compliant with PAP therapy to improve sleep related symptoms and decrease long term cardiovascular risks. Please note that untreated obstructive sleep apnea carries additional perioperative morbidity. Patients with significant obstructive sleep apnea should receive perioperative PAP therapy and the surgeons and particularly the anesthesiologist should be informed of the diagnosis and the severity of the sleep disordered breathing. 2. Weight loss should be strongly encouraged to help reduce the severity of his sleep disordered breathing and therefore, help reduce the required PAP treatment pressure.  3. This study shows sleep fragmentation and abnormal sleep stage percentages; these are nonspecific findings and per se do not signify an intrinsic sleep disorder or a cause for the patient's sleep-related symptoms. Causes include (but are not limited to) the first night effect of the sleep study, circadian rhythm disturbances, medication effect or an underlying mood disorder or medical problem.  4. The patient should be  cautioned not to drive, work at heights, or operate dangerous or heavy equipment when tired or sleepy. Review and reiteration of good sleep hygiene measures should be pursued with any patient. 5. The patient will be seen in follow-up by Dr. Rexene Alberts at [facility] for discussion of the test results and further management strategies. The referring provider will be notified of the test results.  I certify that I have reviewed the entire raw data recording prior to the issuance of this report in accordance with the Standards of Accreditation of the American Academy of Sleep Medicine (AASM)   Star Age, MD, PhD Diplomat, American Board of Psychiatry and Neurology (Neurology and Sleep Medicine)

## 2016-08-25 NOTE — Progress Notes (Signed)
Carl Knox:  Patient referred by CM and Dr. Jannifer Franklin, seen by me on 08/02/16, split study on 08/20/16. Please call and notify patient that the recent sleep study confirmed the diagnosis of severe OSA. He did well with CPAP during the study with significant improvement of the respiratory events. Therefore, I would like start the patient on CPAP therapy at home by prescribing a machine for home use. I placed the order in the chart. The patient will need a follow up appointment with me or [name]. in 10 weeks post set up that has to be scheduled; please go ahead and schedule while you have the patient on the phone and make sure patient understands the importance of keeping this window for the FU appointment, as it is often an insurance requirement and failing to adhere to this may result in losing coverage for sleep apnea treatment.  Please re-enforce the importance of compliance with treatment and the need for [place] to monitor compliance data - again an insurance requirement and good feedback for the patient as far as how they are doing.  Also remind patient, that any upcoming CPAP machine or mask issues, should be first addressed with the DME company. Please ask if patient has a preference regarding DME company.  Please arrange for CPAP set up at home through a DME company of patient's choice - once you have spoken to the patient - and faxed/routed report to PCP and referring MD (if other than PCP), you can close this encounter, thanks,   Star Age, MD, PhD [facility]

## 2016-08-25 NOTE — Addendum Note (Signed)
Addended by: Star Age on: 08/25/2016 08:19 AM   Modules accepted: Orders

## 2016-08-26 ENCOUNTER — Telehealth: Payer: Self-pay

## 2016-08-26 NOTE — Telephone Encounter (Signed)
-----   Message from Star Age, MD sent at 08/25/2016  8:19 AM EDT ----- Carl Knox:  Patient referred by CM and Dr. Jannifer Franklin, seen by me on 08/02/16, split study on 08/20/16. Please call and notify patient that the recent sleep study confirmed the diagnosis of severe OSA. He did well with CPAP during the study with significant improvement of the respiratory events. Therefore, I would like start the patient on CPAP therapy at home by prescribing a machine for home use. I placed the order in the chart. The patient will need a follow up appointment with me or [name]. in 10 weeks post set up that has to be scheduled; please go ahead and schedule while you have the patient on the phone and make sure patient understands the importance of keeping this window for the FU appointment, as it is often an insurance requirement and failing to adhere to this may result in losing coverage for sleep apnea treatment.  Please re-enforce the importance of compliance with treatment and the need for [place] to monitor compliance data - again an insurance requirement and good feedback for the patient as far as how they are doing.  Also remind patient, that any upcoming CPAP machine or mask issues, should be first addressed with the DME company. Please ask if patient has a preference regarding DME company.  Please arrange for CPAP set up at home through a DME company of patient's choice - once you have spoken to the patient - and faxed/routed report to PCP and referring MD (if other than PCP), you can close this encounter, thanks,   Star Age, MD, PhD [facility] ([place])

## 2016-08-26 NOTE — Telephone Encounter (Signed)
I called pt, spoke to pt's wife, Carl Knox, per DPR. I advised pt's wife that Dr. Rexene Alberts reviewed their sleep study results and found that pt has severe osa but did well with a cpap. Dr. Rexene Alberts recommends that pt start a cpap at home. I reviewed PAP compliance expectations with the pt's wife. Pt's wife is agreeable to pt starting a CPAP. I advised pt that an order will be sent to a DME, [facility], and [facility] will call the pt within about one week after they file with the pt's insurance. [facility] will show the pt how to use the machine, fit for masks, and troubleshoot the CPAP if needed. A follow up appt was made for insurance purposes with Dr. Rexene Alberts on 11/02/16 at 2:00pm. Pt's wife verbalized understanding to arrive 15 minutes early and bring their CPAP. A letter with all of this information in it will be mailed to the pt as a reminder. I verified with the pt's wife that the address we have on file is correct. Pt's wife verbalized understanding of results. Pt's wife had no questions at this time but was encouraged to call back if questions arise.

## 2016-09-03 DIAGNOSIS — G932 Benign intracranial hypertension: Secondary | ICD-10-CM | POA: Diagnosis not present

## 2016-09-03 DIAGNOSIS — E320 Persistent hyperplasia of thymus: Secondary | ICD-10-CM | POA: Diagnosis not present

## 2016-09-03 DIAGNOSIS — E038 Other specified hypothyroidism: Secondary | ICD-10-CM | POA: Diagnosis not present

## 2016-09-09 ENCOUNTER — Telehealth: Payer: Self-pay | Admitting: *Deleted

## 2016-09-09 MED FILL — LEVOTHYROXINE 75 MCG TABLET: 75 | 30 days supply | Qty: 30 | Fill #0

## 2016-09-09 NOTE — Telephone Encounter (Signed)
Gave completed/signed FMLA paperwork from [employer] back to medical records to process for patient.

## 2016-09-10 ENCOUNTER — Telehealth: Payer: Self-pay | Admitting: *Deleted

## 2016-09-10 DIAGNOSIS — Z0289 Encounter for other administrative examinations: Secondary | ICD-10-CM

## 2016-09-10 NOTE — Telephone Encounter (Signed)
I called patient, talk with the wife. The patient has been to an ophthalmologist last week and there has been some improvement in the papilledema.  The patient remains on Lasix and Topamax, hopefully may be able to convert to a single agent in the future.  The patient is also followed through endocrinology.  The wife indicates that there may be some questions future about his endocrinology follow-up and ophthalmologist.

## 2016-09-13 MED FILL — TOPIRAMATE 25 MG TABLET: 25 | 30 days supply | Qty: 60 | Fill #2

## 2016-09-13 MED FILL — FUROSEMIDE 20 MG TABLET: 20 | 30 days supply | Qty: 30 | Fill #2

## 2016-09-13 MED FILL — POTASSIUM CL 10 MEQ TAB SA: 10 | 30 days supply | Qty: 60 | Fill #2

## 2016-09-27 DIAGNOSIS — G4733 Obstructive sleep apnea (adult) (pediatric): Secondary | ICD-10-CM | POA: Diagnosis not present

## 2016-10-07 ENCOUNTER — Telehealth: Payer: Self-pay | Admitting: Neurology

## 2016-10-07 NOTE — Telephone Encounter (Signed)
The patient was seen by Dr. Jola Schmidt from [facility] ophthalmology on 09/06/2016. The patient has pseudotumor cerebri. His vision is 20/20 bilaterally. Disc evaluation showed good improvement in disc edema. The patient will be seen again in 4 months.

## 2016-10-10 ENCOUNTER — Encounter: Payer: Self-pay | Admitting: Nurse Practitioner

## 2016-10-11 NOTE — Progress Notes (Signed)
[facility]  PATIENT: Carl Knox DOB: [birth year]   REASON FOR VISIT: Follow-up for pseudotumor cerebri, syncopal episodes associated with laughing, sleep study positive for obstructive sleep apnea on CPAP HISTORY FROM: Patient and wife    HISTORY OF PRESENT ILLNESS:UPDATE 09/25/2018CM Carl Knox, 51 year old male returns for follow-up with history of pseudotumor cerebri. Last visit with ophthalmology 09/06/2016. Patient's vision at that time was 20/20 bilaterally and disc evaluation showed good improvement and disc edema. Patient is currently on Lasix 20 mg daily and Topamax 25 twice daily. He would like to come off of the Lasix. He denies any headache, he denies any blurred vision. Sleep study ordered after his last visit was positive for obstructive sleep apnea and he has been on CPAP for 2 weeks. Compliance data for the last 2 weeks shows compliance at 83% and usage greater than 5 hours. He will return for full compliance report in October with Dr. Rexene Alberts. He returns for reevaluation   UPDATE 06/22/2018CM Mr Knox 51 year old male returns for follow-up for pseudotumor cerebri. Patient had an LP in February with opening pressure of 36. He has not been able to tolerate Diamox in the past he was placed on Lasix and potassium. MRI of the brain shows some ventriculomegaly unchanged from 2010. 2-D echo was normal. He had BMP 03/31/2016 which was normal. He had recent visit to Dr. Valetta Close, his ophthalmologist who reported his best corrected visual acuity 20/20 continued bilateral disc edema which appears to be slightly improved in the right eye stable left. His suggestion was to be on Topamax. Blood pressure in the office today noted to be 115/71. Patient also needs to have a sleep study he has not used his CPAP years and has previous history of obstructive sleep apnea. He returns for reevaluation   02/06/16 KWMr. Knox is a 51 year old left-handed black male with a history of  pseudotumor cerebri, seen through this office previously. The patient was treated with Diamox which he could not tolerate, and eventually was switched to Lasix. The patient has been off of all medications for 3-4 years, he continues to be followed through his ophthalmologist. The patient was last seen on 01/16/2016, he was told that there was some mild disc blurring, but this was not severe. Throughout his life, the patient has noted episodes of syncope associated with laughing. This began around age 70 or 81. The patient last had an event while at work on 12/29/2015. The patient indicates that he will start laughing and he may start feeling dizzy. If he can stop laughing, he may be able to abort a syncopal event, but on this last episode he fell to the floor injuring his left knee and left elbow. The patient was unconscious only for a few seconds, he did have true syncope. The patient may occasionally have some jerks or twitches before he goes out. The does not bite his tongue or lose control of the bowels or the bladder. He does have obstructive sleep apnea on CPAP, but he has not been using CPAP in over 10 years as this resulted in multiple episodes of pneumonia. The patient did have some recent weight gain. The patient denies any headaches and neck stiffness. The patient does have some mild gait instability. He denies issues controlling the bowels or the bladder. The patient reports no focal numbness or weakness of the face, arms, or legs. He may have some visual dimming and tunnel vision prior to the syncopal event. He denies nausea or significant diaphoresis  with the events. He denies palpitations of the heart or chest pain. He is sent to this office for an evaluation.   REVIEW OF SYSTEMS: Full 14 system review of systems performed and notable only for those listed, all others are neg:  Constitutional: neg  Cardiovascular: neg Ear/Nose/Throat: neg  Skin: neg Eyes: neg Respiratory:  neg Gastroitestinal: neg  Hematology/Lymphatic: neg  Endocrine: neg Musculoskeletal: Joint pain Allergy/Immunology: neg Neurological: neg Psychiatric: neg Sleep : Obstructive sleep apnea with CPAP   ALLERGIES: No Known Allergies  HOME MEDICATIONS: Outpatient Medications Prior to Visit  Medication Sig Dispense Refill  . furosemide (LASIX) 20 MG tablet TAKE 1 TABLET (20 MG TOTAL) BY MOUTH DAILY. 30 tablet 3  . levothyroxine (SYNTHROID, LEVOTHROID) 100 MCG tablet Take 100 mcg by mouth daily.  4  . Multiple Vitamin (MULTIVITAMIN) tablet Take 1 tablet by mouth daily.    . potassium chloride (K-DUR,KLOR-CON) 10 MEQ tablet TAKE 2 TABLETS BY MOUTH DAILY. 60 tablet 3  . topiramate (TOPAMAX) 25 MG tablet Take 1 tablet (25 mg total) by mouth 2 (two) times daily. 1 by mouth daily for 1 week then 1 twice daily (Patient taking differently: Take 25 mg by mouth 2 (two) times daily. 1 by mouth am 2 at hs for  for 1 week then 2  twice daily) 60 tablet 6   No facility-administered medications prior to visit.     PAST MEDICAL HISTORY: Past Medical History:  Diagnosis Date  . Morbid obesity ([place])   . OSA on CPAP   . Pseudotumor cerebri   . Syncope and collapse 02/06/2016    PAST SURGICAL HISTORY: Past Surgical History:  Procedure Laterality Date  . CYST EXCISION  2001   Thyglossal duct  . TONSILLECTOMY  1980    FAMILY HISTORY: Family History  Problem Relation Age of Onset  . Hypertension Mother   . Heart disease Mother   . Kidney disease Mother   . Diabetes Father   . Hypertension Father     SOCIAL HISTORY: Social History   Social History  . Marital status: Married    Spouse name: N/A  . Number of children: 2  . Years of education: Some grad school   Occupational History  . [employer]    Social History Main Topics  . Smoking status: Never Smoker  . Smokeless tobacco: Never Used  . Alcohol use 0.0 oz/week     Comment: Occasional  . Drug use: No  . Sexual activity: Not on  file     Comment: Married   Other Topics Concern  . Not on file   Social History Narrative   Lives at home w/ his wife   Left-handed   Caffeine: 2 cups daily     PHYSICAL EXAM  Vitals:   10/12/16 0742  BP: 106/69  Pulse: 64  Weight: (!) 327 lb (148.3 kg)  Height: 6' (1.829 m)   Body mass index is 44.35 kg/m.  Generalized: Well developed, morbidly obese male in no acute distress  Head: normocephalic and atraumatic,. Oropharynx benign  Neck: Supple,  Cardiac: Regular rate rhythm, no murmur  Musculoskeletal: No deformity   Neurological examination   Mentation: Alert oriented to time, place, history taking. Attention span and concentration appropriate. Recent and remote memory intact.  Follows all commands speech and language fluent.   Cranial nerve II-XII: Fundoscopic exam reveals no papilledema  Pupils were equal round reactive to light extraocular movements were full, visual field were full on confrontational test. Facial  sensation and strength were normal. hearing was intact to finger rubbing bilaterally. Uvula tongue midline. head turning and shoulder shrug were normal and symmetric.Tongue protrusion into cheek strength was normal. Motor: normal bulk and tone, full strength in the BUE, BLE, fine finger movements normal, no pronator drift. No focal weakness Sensory: normal and symmetric to light touch,  in the upper and lower extremities Coordination: finger-nose-finger, heel-to-shin bilaterally, no dysmetria Reflexes: Symmetric upper and lower plantar responses were flexor bilaterally. Gait and Station: Rising up from seated position without assistance, normal stance,  moderate stride, good arm swing, smooth turning, able to perform tiptoe, and heel walking without difficulty. Tandem gait is steady  DIAGNOSTIC DATA (LABS, IMAGING, TESTING) - I reviewed patient records, labs, notes, testing and imaging myself where available.  Lab Results  Component Value Date   WBC 4.5  04/27/2016   HGB 14.0 04/27/2016   HCT 40.9 04/27/2016   MCV 77 (L) 04/27/2016   PLT 278 04/27/2016      Component Value Date/Time   NA 141 04/27/2016 1136   K 5.1 04/27/2016 1136   CL 102 04/27/2016 1136   CO2 24 04/27/2016 1136   GLUCOSE 95 04/27/2016 1136   BUN 14 04/27/2016 1136   CREATININE 1.31 (H) 04/27/2016 1136   CALCIUM 9.2 04/27/2016 1136   PROT 7.4 04/27/2016 1136   ALBUMIN 4.3 04/27/2016 1136   AST 28 04/27/2016 1136   ALT 22 04/27/2016 1136   ALKPHOS 94 04/27/2016 1136   BILITOT 0.6 04/27/2016 1136   GFRNONAA 63 04/27/2016 1136   GFRAA 73 04/27/2016 1136    ASSESSMENT AND PLAN  51 y.o. year old male  has a past medical history of Morbid obesity ([place]); OSA on CPAP; Pseudotumor cerebri; and Syncope and collapse (02/06/2016). here To follow-up.  LP in February with opening pressure of 36. He has not been able to tolerate Diamox in the past he was placed on Lasix and potassium. MRI of the brain shows some ventriculomegaly unchanged from 2010. 2-D echo was normal. He had BMP 03/31/2016 which was normal. He had recent visit to Dr. Valetta Close, his ophthalmologist 09/06/2016. Patient's vision at that time was 20/20 bilaterally and disc evaluation showed good improvement .  PLAN: May discontinue Lasix when on full dose of Topamax(100mg )  Increase  Topamax to 25 mg am and 50mg  pm  1 week then increase to 2 twice daily CPAP compliance good for 2 weeks keep follow up with Dr. Rexene Alberts F/U in 6 months I spent 58minin total face to face time with the patient more than 50% of which was spent counseling and coordination of care, reviewing test results reviewing medications and discussing and reviewing the diagnosis of pseudotumor cerebri and CPAP compliance Dennie Bible, [facility], [facility], APRN  [facility] [address], [place], [place] [address] [phone]

## 2016-10-12 ENCOUNTER — Ambulatory Visit: Payer: 59 | Admitting: Nurse Practitioner

## 2016-10-12 ENCOUNTER — Encounter: Payer: Self-pay | Admitting: Nurse Practitioner

## 2016-10-12 ENCOUNTER — Ambulatory Visit (INDEPENDENT_AMBULATORY_CARE_PROVIDER_SITE_OTHER): Payer: 59 | Admitting: Nurse Practitioner

## 2016-10-12 VITALS — BP 106/69 | HR 64 | Ht 72.0 in | Wt 327.0 lb

## 2016-10-12 DIAGNOSIS — G4733 Obstructive sleep apnea (adult) (pediatric): Secondary | ICD-10-CM

## 2016-10-12 DIAGNOSIS — G932 Benign intracranial hypertension: Secondary | ICD-10-CM

## 2016-10-12 DIAGNOSIS — Z8669 Personal history of other diseases of the nervous system and sense organs: Secondary | ICD-10-CM

## 2016-10-12 DIAGNOSIS — Z9989 Dependence on other enabling machines and devices: Secondary | ICD-10-CM

## 2016-10-12 MED ORDER — TOPIRAMATE 25 MG PO TABS: 25.0000 mg | ORAL_TABLET | Freq: Two times a day (BID) | ORAL | 1 refills | Status: DC

## 2016-10-12 MED FILL — FUROSEMIDE 20 MG TABLET: 20 | 30 days supply | Qty: 30 | Fill #3

## 2016-10-12 MED FILL — TOPIRAMATE 25 MG TAB: 25 | 90 days supply | Qty: 360 | Fill #0

## 2016-10-12 MED FILL — KLOR-CON M10 TABLET: 10 | 30 days supply | Qty: 60 | Fill #3

## 2016-10-12 MED FILL — LEVOTHYROXINE 75 MCG TABLET: 75 | 30 days supply | Qty: 30 | Fill #1

## 2016-10-12 NOTE — Patient Instructions (Addendum)
May discontinue Lasix when on full dose of Topamax(100mg )  Increase  Topamax to 25 mg am and 50mg  pm  1 week then increase to 2 twice daily CPAP compliance good for 2 weeks keep follow up with Dr. Rexene Alberts F/U in 6 months  Idiopathic Intracranial Hypertension Idiopathic intracranial hypertension (IIH) is a condition that increases pressure around the brain. The fluid that surrounds the brain and spinal cord (cerebrospinal fluid, CSF) increases and causes the pressure. Idiopathic means that the cause of this condition is not known. IIH affects the brain and spinal cord (is a neurological disorder). If this condition is not treated, it can cause vision loss or blindness. What increases the risk? You are more likely to develop this condition if:  You are severely overweight (obese).  You are a woman who has not gone through menopause.  You take certain medicines, such as birth control or steroids.  What are the signs or symptoms? Symptoms of IIH include:  Headaches. This is the most common symptom.  Pain in the shoulders or neck.  Nausea and vomiting.  A "rushing water" or pulsing sound within the ears (pulsatile tinnitus).  Double vision.  Blurred vision.  Brief episodes of complete vision loss.  How is this diagnosed? This condition may be diagnosed based on:  Your symptoms.  Your medical history.  CT scan of the brain.  MRI of the brain.  Magnetic resonance venogram (MRV) to check veins in the brain.  Diagnostic lumbar puncture. This is a procedure to remove and examine a sample of cerebrospinal fluid. This procedure can determine whether too much fluid may be causing IIH.  A thorough eye exam to check for swelling or nerve damage in the eyes.  How is this treated? Treatment for this condition depends on your symptoms. The goal of treatment is to decrease the pressure around your brain. Common treatments include:  Medicines to decrease the production of spinal fluid  and lower the pressure within your skull.  Medicines to prevent or treat headaches.  Surgery to place drains (shunts) in your brain to remove excess fluid.  Lumbar puncture to remove excess cerebrospinal fluid.  Follow these instructions at home:  If you are overweight or obese, work with your health care provider to lose weight.  Take over-the-counter and prescription medicines only as told by your health care provider.  Do not drive or use heavy machinery while taking medicines that can make you sleepy.  Keep all follow-up visits as told by your health care provider. This is important. Contact a health care provider if:  You have changes in your vision, such as: ? Double vision. ? Not being able to see colors (color vision). Get help right away if:  You have any of the following symptoms and they get worse or do not get better. ? Headaches. ? Nausea. ? Vomiting. ? Vision changes or difficulty seeing. Summary  Idiopathic intracranial hypertension (IIH) is a condition that increases pressure around the brain. The cause is not known (is idiopathic).  The most common symptom of IIH is headaches.  Treatment may include medicines or surgery to relieve the pressure on your brain. This information is not intended to replace advice given to you by your health care provider. Make sure you discuss any questions you have with your health care provider. Document Released: 03/15/2001 Document Revised: 11/26/2015 Document Reviewed: 11/26/2015 Elsevier Interactive Patient Education  2017 [employer].

## 2016-10-12 NOTE — Progress Notes (Signed)
I have read the note, and I agree with the clinical assessment and plan.  WILLIS,CHARLES KEITH   

## 2016-10-27 DIAGNOSIS — G4733 Obstructive sleep apnea (adult) (pediatric): Secondary | ICD-10-CM | POA: Diagnosis not present

## 2016-10-30 ENCOUNTER — Encounter: Payer: Self-pay | Admitting: Neurology

## 2016-11-02 ENCOUNTER — Ambulatory Visit (INDEPENDENT_AMBULATORY_CARE_PROVIDER_SITE_OTHER): Payer: 59 | Admitting: Neurology

## 2016-11-02 ENCOUNTER — Encounter: Payer: Self-pay | Admitting: Neurology

## 2016-11-02 VITALS — BP 122/76 | HR 64 | Ht 72.0 in | Wt 321.0 lb

## 2016-11-02 DIAGNOSIS — G4733 Obstructive sleep apnea (adult) (pediatric): Secondary | ICD-10-CM

## 2016-11-02 DIAGNOSIS — Z9989 Dependence on other enabling machines and devices: Secondary | ICD-10-CM

## 2016-11-02 NOTE — Progress Notes (Signed)
Subjective:    Patient ID: Carl Knox is a 51 y.o. male.  HPI     Interim history:   Carl Knox is a 51 year old right-handed gentleman with an underlying medical history of pseudotumor cerebri, syncope, hypothyroidism and morbid obesity, who presents for follow-up consultation of his obstructive sleep apnea, after recent sleep study testing. The patient is accompanied by his wife today. I first met him on 08/02/2016 at the request of Cecille Rubin and Dr. Jannifer Franklin, at which time he reported a prior diagnosis of obstructive sleep apnea and intolerance to CPAP and BiPAP in the past. I asked him to return for a sleep study. He had a split-night sleep study on 08/20/2016. I went over his test results with him in detail today. Baseline sleep latency was 22.5 minutes, REM latency was 89.5 minutes, sleep efficiency for the diagnostic portion of the study was 89.7%. He had absence of slow-wave sleep and REM sleep was 10%. Total AHI was 66.2 per hour, average oxygen saturation 95%, nadir was 78%, he had no significant PLMS. He was fitted with a medium full face mask. CPAP was titrated from 5 cm to 17 cm. On a pressure of 16 cm his AHI was 0 per hour with supine REM sleep achieved an O2 nadir of 91%. Based on his test results I prescribed CPAP therapy for home use.   Today, 11/02/2016: I reviewed his CPAP compliance data from 10/01/2016 through 10/30/2016 which is a total of 30 days, during which time he used his CPAP 28 days with percent used days greater than 4 hours at 77%, indicating adequate compliance with an average usage of 5 hours and 11 minutes, residual AHI low at 0.2 per hour, leak high with the 95th percentile at 43.3 L/m on a pressure of 16 cm with EPR of 3. He reports feeling somewhat better than before with CPAP. He is working on weight loss, in fact, has lost about 11 lb since our first visit. Was able to stop the Lasix and has FU with ophth, Dr. Valetta Close.   The patient's allergies, current  medications, family history, past medical history, past social history, past surgical history and problem list were reviewed and updated as appropriate.   Previously (copied from previous notes for reference):   08/02/2016: (He) was previously diagnosed with obstructive sleep apnea in 1999 and placed on CPAP therapy. Prior sleep study results are not available for my review today. He has not been using CPAP. He was also tried on BiPAP but is not using BiPAP therapy there. He could not tolerate it. A compliance download is not available for my review today. I reviewed your office note from 07/09/2016. His Epworth sleepiness score is 22 out of 24 today, fatigue score is 40 out of 63. His wife kept written records and states, his CPAP was started in 7/99 and then he was switched to BiPAP in 11/01, but did not tolerate either at the time, and had recurrent pneumonias. He has had worsening nasal congestion, had thyroglossal duct cyst surgery in Sept 2000, and FU with ENT in 2001. He had a TE as a child. He has nasal congestion, mouth breathing, also worsening. He Would be willing to get retested and try CPAP again. His mother has sleep apnea and uses a CPAP machine. He has dozed off at the wheel briefly, no issues with sleepiness at work are reported. He works in [employer], educational, typically from 7 AM to 3 PM, once or twice per week over time with  12 hour shifts, 7 AM to 7 PM. Bedtime is late, around midnight, wakeup time for work between 5 and 6 AM. He was originally diagnosed with pseudotumor cerebri in 2011. He has gained weight in the past few years. He is a nonsmoker, drinks alcohol very occasionally, maybe once a month or so, so dose of a daily basis, 2-3 cans per day typically. He has nocturia about once or twice per hour tonight, denies morning headaches.     His Past Medical History Is Significant For: Past Medical History:  Diagnosis Date  . Morbid obesity ([place])   . OSA on CPAP   . Pseudotumor cerebri    . Syncope and collapse 02/06/2016    His Past Surgical History Is Significant For: Past Surgical History:  Procedure Laterality Date  . CYST EXCISION  2001   Thyglossal duct  . TONSILLECTOMY  1980    His Family History Is Significant For: Family History  Problem Relation Age of Onset  . Hypertension Mother   . Heart disease Mother   . Kidney disease Mother   . Diabetes Father   . Hypertension Father     His Social History Is Significant For: Social History   Social History  . Marital status: Married    Spouse name: N/A  . Number of children: 2  . Years of education: Some grad school   Occupational History  . [employer]    Social History Main Topics  . Smoking status: Never Smoker  . Smokeless tobacco: Never Used  . Alcohol use 0.0 oz/week     Comment: Occasional  . Drug use: No  . Sexual activity: Not Asked     Comment: Married   Other Topics Concern  . None   Social History Narrative   Lives at home w/ his wife   Left-handed   Caffeine: 2 cups daily    His Allergies Are:  No Known Allergies:   His Current Medications Are:  Outpatient Encounter Prescriptions as of 11/02/2016  Medication Sig  . levothyroxine (SYNTHROID, LEVOTHROID) 75 MCG tablet Take 75 mcg by mouth daily.  . Multiple Vitamin (MULTIVITAMIN) tablet Take 1 tablet by mouth daily.  Marland Kitchen topiramate (TOPAMAX) 25 MG tablet Take 50 mg by mouth 2 (two) times daily.  . [DISCONTINUED] furosemide (LASIX) 20 MG tablet TAKE 1 TABLET (20 MG TOTAL) BY MOUTH DAILY.  . [DISCONTINUED] levothyroxine (SYNTHROID, LEVOTHROID) 100 MCG tablet Take 100 mcg by mouth daily.  . [DISCONTINUED] potassium chloride (K-DUR,KLOR-CON) 10 MEQ tablet TAKE 2 TABLETS BY MOUTH DAILY.  . [DISCONTINUED] topiramate (TOPAMAX) 25 MG tablet Take 1 tablet (25 mg total) by mouth 2 (two) times daily. 1 by mouth am 2 at hs for  for 1 week then 2  twice daily   No facility-administered encounter medications on file as of 11/02/2016.    :  Review of Systems:  Out of a complete 14 point review of systems, all are reviewed and negative with the exception of these symptoms as listed below: Review of Systems  Neurological:       Pt presents today to discuss his cpap. Pt says that he feels "nothing" but is better than he was before cpap therapy.    Objective:  Neurological Exam  Physical Exam Physical Examination:   Vitals:   11/02/16 1412  BP: 122/76  Pulse: 64    General Examination: The patient is a very pleasant 51 y.o. male in no acute distress. He appears well-developed and well-nourished and well groomed.  HEENT: Normocephalic, atraumatic, pupils are equal, round and reactive to light and accommodation. Extraocular tracking is good without limitation to gaze excursion or nystagmus noted. Normal smooth pursuit is noted. Hearing is grossly intact. Face is symmetric with normal facial animation and normal facial sensation. Speech is clear with no dysarthria noted. There is no hypophonia. There is no lip, neck/head, jaw or voice tremor. Neck is supple with full range of passive and active motion. There are no carotid bruits on auscultation. Oropharynx exam reveals: mild mouth dryness, adequate dental hygiene and marked airway crowding, due to large tongue, smaller airway entry, larger uvula. Mallampati is class II, tonsils absent. Tongue protrudes centrally and palate elevates symmetrically.   Chest: Clear to auscultation without wheezing, rhonchi or crackles noted.  Heart: S1+S2+0, regular and normal without murmurs, rubs or gallops noted.   Abdomen: Soft, non-tender and non-distended with normal bowel sounds appreciated on auscultation.  Extremities: There is no pitting edema in the distal lower extremities bilaterally. Pedal pulses are intact.  Skin: Warm and dry without trophic changes noted.  Musculoskeletal: exam reveals no obvious joint deformities, tenderness or joint swelling or erythema.    Neurologically:  Mental status: The patient is awake, alert and oriented in all 4 spheres. His immediate and remote memory, attention, language skills and fund of knowledge are appropriate. There is no evidence of aphasia, agnosia, apraxia or anomia. Speech is clear with normal prosody and enunciation. Thought process is linear. Mood is normal and affect is blunted.  Cranial nerves II - XII are as described above under HEENT exam.  Motor exam: Normal bulk, strength and tone is noted. There is no drift, tremor or rebound. Romberg is negative. Reflexes are 1+ throughout. Fine motor and coordination: intact with normal finger taps, normal hand movements, normal rapid alternating patting, normal foot taps and normal foot agility.  Cerebellar testing: No dysmetria or intention tremor. There is no truncal or gait ataxia.  Sensory exam: intact to light touch in the upper and lower extremities.  Gait, station and balance: He stands easily. No veering to one side is noted. No leaning to one side is noted. Posture is age-appropriate and stance is narrow based. Gait shows normal stride length and normal pace. No problems turning are noted. Tandem walk is unremarkable.                Assessment and plan:  In summary, Carl Knox is a very pleasant 52 year old male with an underlying medical history of pseudotumor cerebri, syncope, hypothyroidism and morbid obesity, whoPresents for follow-up consultation of his obstructive sleep apnea, he had a prior diagnosis of this of years ago but had not been on CPAP or BiPAP lately. He had a split-night sleep study in October 2018 and we discussed test results as well as his recent compliance data. He is compliant with treatment and is commended for this, I did advise him to try to make more time for sleep, try to achieve 7-8 hours of sleep on an average night if possible. Physical exam is stable. He had some recent medication changes including increase in Topamax and  discontinuation of Lasix. He is in close follow-up with his ophthalmologist. He has an appointment with Cecille Rubin in March 2019 and I asked him to keep this to discuss his sleep apnea follow-up as well. We can probably see him yearly after that.  I explained the importance of being compliant with PAP treatment, not only for insurance purposes but primarily to improve His  symptoms, and for the patient's long term health benefit, including to reduce His cardiovascular risks. He is trying to lose weight and has lost a little over 10 pounds in the past 3-4 months for which he is also commended. I answered all their questions today and the patient and his wife were in agreement.

## 2016-11-02 NOTE — Patient Instructions (Signed)
Keep up the good work! We can see you in about 6 months for sleep apnea check up. You can keep your appointment with Cecille Rubin in March. We will see you yearly.  Please continue using your CPAP regularly. While your insurance requires that you use CPAP at least 4 hours each night on 70% of the nights, I recommend, that you not skip any nights and use it throughout the night if you can. Getting used to CPAP and staying with the treatment long term does take time and patience and discipline. Untreated obstructive sleep apnea when it is moderate to severe can have an adverse impact on cardiovascular health and raise her risk for heart disease, arrhythmias, hypertension, congestive heart failure, stroke and diabetes. Untreated obstructive sleep apnea causes sleep disruption, nonrestorative sleep, and sleep deprivation. This can have an impact on your day to day functioning and cause daytime sleepiness and impairment of cognitive function, memory loss, mood disturbance, and problems focussing. Using CPAP regularly can improve these symptoms.

## 2016-11-04 DIAGNOSIS — E038 Other specified hypothyroidism: Secondary | ICD-10-CM | POA: Diagnosis not present

## 2016-11-05 DIAGNOSIS — E669 Obesity, unspecified: Secondary | ICD-10-CM | POA: Diagnosis not present

## 2016-11-05 DIAGNOSIS — Z6841 Body Mass Index (BMI) 40.0 and over, adult: Secondary | ICD-10-CM | POA: Diagnosis not present

## 2016-11-05 DIAGNOSIS — E236 Other disorders of pituitary gland: Secondary | ICD-10-CM | POA: Diagnosis not present

## 2016-11-05 DIAGNOSIS — E320 Persistent hyperplasia of thymus: Secondary | ICD-10-CM | POA: Diagnosis not present

## 2016-11-05 DIAGNOSIS — E039 Hypothyroidism, unspecified: Secondary | ICD-10-CM | POA: Diagnosis not present

## 2016-11-05 DIAGNOSIS — R6882 Decreased libido: Secondary | ICD-10-CM | POA: Diagnosis not present

## 2016-11-05 DIAGNOSIS — E230 Hypopituitarism: Secondary | ICD-10-CM | POA: Diagnosis not present

## 2016-11-05 DIAGNOSIS — Z9889 Other specified postprocedural states: Secondary | ICD-10-CM | POA: Diagnosis not present

## 2016-11-12 MED FILL — LEVOTHYROXINE 75 MCG TABLET: 75 | 30 days supply | Qty: 30 | Fill #2

## 2016-11-27 DIAGNOSIS — G4733 Obstructive sleep apnea (adult) (pediatric): Secondary | ICD-10-CM | POA: Diagnosis not present

## 2016-12-13 MED FILL — LEVOTHYROXINE 75 MCG TABLET: 75 | 30 days supply | Qty: 30 | Fill #3

## 2016-12-20 DIAGNOSIS — G932 Benign intracranial hypertension: Secondary | ICD-10-CM | POA: Diagnosis not present

## 2016-12-27 DIAGNOSIS — G4733 Obstructive sleep apnea (adult) (pediatric): Secondary | ICD-10-CM | POA: Diagnosis not present

## 2017-01-04 ENCOUNTER — Telehealth: Payer: Self-pay | Admitting: Neurology

## 2017-01-04 NOTE — Telephone Encounter (Signed)
Patient was seen by Dr. Jola Schmidt on 23 December 2016, ophthalmologic evaluation at that time shows no evidence of papilledema, the patient is on Topamax.

## 2017-01-10 MED FILL — LEVOTHYROXINE 75 MCG TABLET: 75 | 30 days supply | Qty: 30 | Fill #4

## 2017-01-10 MED FILL — TOPIRAMATE 25 MG TAB: 25 | 90 days supply | Qty: 360 | Fill #1

## 2017-01-10 MED FILL — KETOCONAZOLE 2% CREAM: 2 | 30 days supply | Qty: 60 | Fill #1

## 2017-01-31 DIAGNOSIS — Z136 Encounter for screening for cardiovascular disorders: Secondary | ICD-10-CM | POA: Diagnosis not present

## 2017-01-31 DIAGNOSIS — E230 Hypopituitarism: Secondary | ICD-10-CM | POA: Diagnosis not present

## 2017-01-31 DIAGNOSIS — I10 Essential (primary) hypertension: Secondary | ICD-10-CM | POA: Diagnosis not present

## 2017-01-31 DIAGNOSIS — Z125 Encounter for screening for malignant neoplasm of prostate: Secondary | ICD-10-CM | POA: Diagnosis not present

## 2017-01-31 DIAGNOSIS — E039 Hypothyroidism, unspecified: Secondary | ICD-10-CM | POA: Diagnosis not present

## 2017-01-31 DIAGNOSIS — E782 Mixed hyperlipidemia: Secondary | ICD-10-CM | POA: Diagnosis not present

## 2017-01-31 DIAGNOSIS — Z0000 Encounter for general adult medical examination without abnormal findings: Secondary | ICD-10-CM | POA: Diagnosis not present

## 2017-01-31 DIAGNOSIS — G932 Benign intracranial hypertension: Secondary | ICD-10-CM | POA: Diagnosis not present

## 2017-01-31 DIAGNOSIS — Z1211 Encounter for screening for malignant neoplasm of colon: Secondary | ICD-10-CM | POA: Diagnosis not present

## 2017-02-01 DIAGNOSIS — I10 Essential (primary) hypertension: Secondary | ICD-10-CM | POA: Diagnosis not present

## 2017-02-03 MED FILL — LEVOTHYROXINE 75 MCG TABLET: 75 | 90 days supply | Qty: 90 | Fill #0

## 2017-02-28 DIAGNOSIS — G4733 Obstructive sleep apnea (adult) (pediatric): Secondary | ICD-10-CM | POA: Diagnosis not present

## 2017-03-14 ENCOUNTER — Telehealth: Payer: Self-pay | Admitting: *Deleted

## 2017-03-14 ENCOUNTER — Telehealth: Payer: Self-pay | Admitting: Neurology

## 2017-03-14 NOTE — Telephone Encounter (Signed)
Pt's wife called she is faxing FMLA to [employer] (f) [phone]. She can be reached at [phone].

## 2017-03-14 NOTE — Telephone Encounter (Signed)
Pt [employer] form on [facility] desk.

## 2017-03-17 NOTE — Telephone Encounter (Signed)
Form completed, signed and to MR.

## 2017-03-24 ENCOUNTER — Other Ambulatory Visit: Payer: Self-pay | Admitting: Nurse Practitioner

## 2017-03-24 NOTE — Telephone Encounter (Signed)
Ok to change

## 2017-03-25 ENCOUNTER — Other Ambulatory Visit: Payer: Self-pay | Admitting: Neurology

## 2017-03-25 ENCOUNTER — Telehealth: Payer: Self-pay | Admitting: Neurology

## 2017-03-25 MED ORDER — TOPIRAMATE 50 MG PO TABS: 50.0000 mg | ORAL_TABLET | Freq: Two times a day (BID) | ORAL | 2 refills | Status: DC

## 2017-03-25 MED FILL — TOPIRAMATE 50 MG TABLET: 50 | 90 days supply | Qty: 180 | Fill #0

## 2017-03-25 NOTE — Telephone Encounter (Signed)
Wife called that pt ran out of topamax and has called pharmacy but call did not go through. I checked his med and found that his topamax was prescribed yesterday for 3 months supply with one refill. I also called pharmacy but the call did not go through either. I asked wife to go to the pharmacy to pick up in person. If any further problem, she knows to call back.   Rosalin Hawking, MD PhD Stroke Neurology 03/25/2017 2:04 PM

## 2017-03-25 NOTE — Telephone Encounter (Signed)
Pt wife called back that pharmacy did not get order for topamax. Therefore, I reordered tompamax but I give 50mg  tablets, so that pt will take 1 tab twice a day. Wife will check with pharmacy later and call us back if further problems.   Rosalin Hawking, MD PhD Stroke Neurology 03/25/2017 4:51 PM

## 2017-03-28 ENCOUNTER — Telehealth: Payer: Self-pay | Admitting: *Deleted

## 2017-03-28 NOTE — Telephone Encounter (Signed)
[employer] had additional questions regarding FMLA. Verified pt followed for both dx: pseudotumor and sleep apnea. He is followed for both at same office. Given 1-2 times per year (2-4 hr for each appt) for sleep apnea f/u and pseudotumor f/u. For flare-ups: 1 times per month (2 days per episode). Gave to medical records to process for pt.

## 2017-04-11 ENCOUNTER — Ambulatory Visit: Payer: 59 | Admitting: Nurse Practitioner

## 2017-04-19 ENCOUNTER — Encounter: Payer: Self-pay | Admitting: Nurse Practitioner

## 2017-04-25 DIAGNOSIS — Z1211 Encounter for screening for malignant neoplasm of colon: Secondary | ICD-10-CM | POA: Diagnosis not present

## 2017-05-03 ENCOUNTER — Ambulatory Visit: Payer: 59 | Admitting: Nurse Practitioner

## 2017-05-10 MED FILL — LEVOTHYROXINE 75 MCG TABLET: 75 | 90 days supply | Qty: 90 | Fill #1

## 2017-05-11 NOTE — Progress Notes (Addendum)
[facility]  PATIENT: Carl Knox DOB: 1965/10/13   REASON FOR VISIT: Follow-up for pseudotumor cerebri, syncopal episodes associated with laughing, sleep study positive for obstructive sleep apnea on CPAP HISTORY FROM: Patient and wife    HISTORY OF PRESENT ILLNESS:UPDATE 4/25/2019CM Carl Knox, 52 year old male returns for follow-up with history of pseudotumor cerebra and obstructive sleep apnea with CPAP.  Patient is currently on Topamax 50 mg twice daily without side effects he denies any headaches or visual problems.  Patient denies any problems with his CPAP machine however  Compliance is poor.  Patient states sometimes he sleeps at his grandmas and  does not have his machine.  Compliance data dated 04/09/2017 to 05/08/2017 shows compliance greater than 4 hours at 30% for 9 days.  Set pressure 16 cm EPR level 3 AHI 0.3 ESS 5 he returns for reevaluation   UPDATE 09/25/2018CM Carl Knox, 52 year old male returns for follow-up with history of pseudotumor cerebri. Last visit with ophthalmology 09/06/2016. Patient's vision at that time was 20/20 bilaterally and disc evaluation showed good improvement and disc edema. Patient is currently on Lasix 20 mg daily and Topamax 25 twice daily. He would like to come off of the Lasix. He denies any headache, he denies any blurred vision. Sleep study ordered after his last visit was positive for obstructive sleep apnea and he has been on CPAP for 2 weeks. Compliance data for the last 2 weeks shows compliance at 83% and usage greater than 5 hours. He will return for full compliance report in October with Dr. Rexene Alberts. He returns for reevaluation   UPDATE 06/22/2018CM Carl Knox 52 year old male returns for follow-up for pseudotumor cerebri. Patient had an LP in February with opening pressure of 36. He has not been able to tolerate Diamox in the past he was placed on Lasix and potassium. MRI of the brain shows some ventriculomegaly unchanged from  2010. 2-D echo was normal. He had BMP 03/31/2016 which was normal. He had recent visit to Dr. Valetta Close, his ophthalmologist who reported his best corrected visual acuity 20/20 continued bilateral disc edema which appears to be slightly improved in the right eye stable left. His suggestion was to be on Topamax. Blood pressure in the office today noted to be 115/71. Patient also needs to have a sleep study he has not used his CPAP years and has previous history of obstructive sleep apnea. He returns for reevaluation   02/06/16 KWMr. Knox is a 52 year old left-handed black male with a history of pseudotumor cerebri, seen through this office previously. The patient was treated with Diamox which he could not tolerate, and eventually was switched to Lasix. The patient has been off of all medications for 3-4 years, he continues to be followed through his ophthalmologist. The patient was last seen on 01/16/2016, he was told that there was some mild disc blurring, but this was not severe. Throughout his life, the patient has noted episodes of syncope associated with laughing. This began around age 60 or 20. The patient last had an event while at work on 12/29/2015. The patient indicates that he will start laughing and he may start feeling dizzy. If he can stop laughing, he may be able to abort a syncopal event, but on this last episode he fell to the floor injuring his left knee and left elbow. The patient was unconscious only for a few seconds, he did have true syncope. The patient may occasionally have some jerks or twitches before he goes out. The does not  bite his tongue or lose control of the bowels or the bladder. He does have obstructive sleep apnea on CPAP, but he has not been using CPAP in over 10 years as this resulted in multiple episodes of pneumonia. The patient did have some recent weight gain. The patient denies any headaches and neck stiffness. The patient does have some mild gait instability. He denies  issues controlling the bowels or the bladder. The patient reports no focal numbness or weakness of the face, arms, or legs. He may have some visual dimming and tunnel vision prior to the syncopal event. He denies nausea or significant diaphoresis with the events. He denies palpitations of the heart or chest pain. He is sent to this office for an evaluation.   REVIEW OF SYSTEMS: Full 14 system review of systems performed and notable only for those listed, all others are neg:  Constitutional: neg  Cardiovascular: neg Ear/Nose/Throat: neg  Skin: neg Eyes: neg Respiratory: neg Gastroitestinal: neg  Hematology/Lymphatic: neg  Endocrine: neg Musculoskeletal: Joint pain Allergy/Immunology: neg Neurological: neg Psychiatric: neg Sleep : Obstructive sleep apnea with CPAP   ALLERGIES: No Known Allergies  HOME MEDICATIONS: Outpatient Medications Prior to Visit  Medication Sig Dispense Refill  . levothyroxine (SYNTHROID, LEVOTHROID) 75 MCG tablet Take 75 mcg by mouth daily.    . Multiple Vitamin (MULTIVITAMIN) tablet Take 1 tablet by mouth daily.    Marland Kitchen topiramate (TOPAMAX) 50 MG tablet Take 1 tablet (50 mg total) by mouth 2 (two) times daily. 180 tablet 2   No facility-administered medications prior to visit.     PAST MEDICAL HISTORY: Past Medical History:  Diagnosis Date  . Morbid obesity ([place])   . OSA on CPAP   . Pseudotumor cerebri   . Syncope and collapse 02/06/2016    PAST SURGICAL HISTORY: Past Surgical History:  Procedure Laterality Date  . CYST EXCISION  2001   Thyglossal duct  . TONSILLECTOMY  1980    FAMILY HISTORY: Family History  Problem Relation Age of Onset  . Hypertension Mother   . Heart disease Mother   . Kidney disease Mother   . Diabetes Father   . Hypertension Father     SOCIAL HISTORY: Social History   Socioeconomic History  . Marital status: Married    Spouse name: Not on file  . Number of children: 2  . Years of education: Some grad school    . Highest education level: Not on file  Occupational History  . Occupation: [employer]  . Financial resource strain: Not on file  . Food insecurity:    Worry: Not on file    Inability: Not on file  . Transportation needs:    Medical: Not on file    Non-medical: Not on file  Tobacco Use  . Smoking status: Never Smoker  . Smokeless tobacco: Never Used  Substance and Sexual Activity  . Alcohol use: Yes    Alcohol/week: 0.0 oz    Comment: Occasional  . Drug use: No  . Sexual activity: Not on file    Comment: Married  Lifestyle  . Physical activity:    Days per week: Not on file    Minutes per session: Not on file  . Stress: Not on file  Relationships  . Social connections:    Talks on phone: Not on file    Gets together: Not on file    Attends religious service: Not on file    Active member of club or organization: Not  on file    Attends meetings of clubs or organizations: Not on file    Relationship status: Not on file  . Intimate partner violence:    Fear of current or ex partner: Not on file    Emotionally abused: Not on file    Physically abused: Not on file    Forced sexual activity: Not on file  Other Topics Concern  . Not on file  Social History Narrative   Lives at home w/ his wife   Left-handed   Caffeine: 2 cups daily     PHYSICAL EXAM  Vitals:   05/12/17 1324  BP: 136/74  Pulse: 66  Weight: (!) 321 lb 12.8 oz (146 kg)  Height: 6' (1.829 m)   Body mass index is 43.64 kg/m.  Generalized: Well developed, morbidly obese male in no acute distress  Head: normocephalic and atraumatic,. Oropharynx benign  Neck: Supple,  Musculoskeletal: No deformity   Neurological examination   Mentation: Alert oriented to time, place, history taking. Attention span and concentration appropriate. Recent and remote memory intact.  Follows all commands speech and language fluent.   Cranial nerve II-XII: Fundoscopic exam reveals no papilledema .  Visual  acuity 20/30 right 20/20 left.  Pupils were equal round reactive to light extraocular movements were full, visual field were full on confrontational test. Facial sensation and strength were normal. hearing was intact to finger rubbing bilaterally. Uvula tongue midline. head turning and shoulder shrug were normal and symmetric.Tongue protrusion into cheek strength was normal. Motor: normal bulk and tone, full strength in the BUE, BLE,  Sensory: normal and symmetric to light touch,  in the upper and lower extremities Coordination: finger-nose-finger, heel-to-shin bilaterally, no dysmetria Reflexes: Symmetric upper and lower plantar responses were flexor bilaterally. Gait and Station: Rising up from seated position without assistance, normal stance,  moderate stride, good arm swing, smooth turning, able to perform tiptoe, and heel walking without difficulty. Tandem gait is steady  DIAGNOSTIC DATA (LABS, IMAGING, TESTING) - I reviewed patient records, labs, notes, testing and imaging myself where available.  Lab Results  Component Value Date   WBC 4.5 04/27/2016   HGB 14.0 04/27/2016   HCT 40.9 04/27/2016   MCV 77 (L) 04/27/2016   PLT 278 04/27/2016      Component Value Date/Time   NA 141 04/27/2016 1136   K 5.1 04/27/2016 1136   CL 102 04/27/2016 1136   CO2 24 04/27/2016 1136   GLUCOSE 95 04/27/2016 1136   BUN 14 04/27/2016 1136   CREATININE 1.31 (H) 04/27/2016 1136   CALCIUM 9.2 04/27/2016 1136   PROT 7.4 04/27/2016 1136   ALBUMIN 4.3 04/27/2016 1136   AST 28 04/27/2016 1136   ALT 22 04/27/2016 1136   ALKPHOS 94 04/27/2016 1136   BILITOT 0.6 04/27/2016 1136   GFRNONAA 63 04/27/2016 1136   GFRAA 73 04/27/2016 1136    ASSESSMENT AND PLAN  52 y.o. year old male  has a past medical history of Morbid obesity ([place]), OSA on CPAP, Pseudotumor cerebri, and Syncope and collapse (02/06/2016). here To follow-up.  Patient has been unable to tolerate Diamox in the past.  CPAP compliance data  dated 04/09/2017 to 05/08/2017 shows compliance greater than 4 hours at 30% for 9 days.  Set pressure 16 cm EPR level 3 AHI 0.3 ESS 5   PLAN: Continue topamax 50 mg twice daily just refilled CPAP compliance 30% greater than 4 hours which is poor Please try to use CPAP every  night if  possible  F/U in 6 months for repeat compliance I spent 63minin total face to face time with the patient more than 50% of which was spent counseling and coordination of care, reviewing test results reviewing medications and discussing and reviewing the diagnosis of pseudotumor cerebri and CPAP compliance Dennie Bible, [facility], APRN  [facility] [address] [place], [place] [address] [phone]  I reviewed the above note and documentation by the Nurse Practitioner and agree with the history, physical exam, assessment and plan as outlined above. I was immediately available for face-to-face consultation. Star Age, MD, PhD [facility])

## 2017-05-12 ENCOUNTER — Ambulatory Visit: Payer: 59 | Admitting: Nurse Practitioner

## 2017-05-12 ENCOUNTER — Encounter: Payer: Self-pay | Admitting: Nurse Practitioner

## 2017-05-12 VITALS — BP 136/74 | HR 66 | Ht 72.0 in | Wt 321.8 lb

## 2017-05-12 DIAGNOSIS — G4733 Obstructive sleep apnea (adult) (pediatric): Secondary | ICD-10-CM | POA: Diagnosis not present

## 2017-05-12 DIAGNOSIS — G932 Benign intracranial hypertension: Secondary | ICD-10-CM

## 2017-05-12 DIAGNOSIS — Z9989 Dependence on other enabling machines and devices: Secondary | ICD-10-CM | POA: Diagnosis not present

## 2017-05-12 NOTE — Patient Instructions (Addendum)
Continue topamax 50 mg twice daily CPAP compliance 30% greater than 4 hours Please try to use every night if possible  F/U in 6 months for repeat compliance

## 2017-06-20 DIAGNOSIS — K146 Glossodynia: Secondary | ICD-10-CM | POA: Diagnosis not present

## 2017-06-20 DIAGNOSIS — J340 Abscess, furuncle and carbuncle of nose: Secondary | ICD-10-CM | POA: Diagnosis not present

## 2017-06-20 MED FILL — VALACYCLOVIR HCL 500 MG TAB: 500 | 7 days supply | Qty: 14 | Fill #0

## 2017-06-21 DIAGNOSIS — H5203 Hypermetropia, bilateral: Secondary | ICD-10-CM | POA: Diagnosis not present

## 2017-06-21 DIAGNOSIS — H52221 Regular astigmatism, right eye: Secondary | ICD-10-CM | POA: Diagnosis not present

## 2017-06-21 DIAGNOSIS — H4603 Optic papillitis, bilateral: Secondary | ICD-10-CM | POA: Diagnosis not present

## 2017-06-21 DIAGNOSIS — H524 Presbyopia: Secondary | ICD-10-CM | POA: Diagnosis not present

## 2017-06-24 DIAGNOSIS — K146 Glossodynia: Secondary | ICD-10-CM | POA: Diagnosis not present

## 2017-06-24 DIAGNOSIS — T7840XD Allergy, unspecified, subsequent encounter: Secondary | ICD-10-CM | POA: Diagnosis not present

## 2017-06-24 DIAGNOSIS — G932 Benign intracranial hypertension: Secondary | ICD-10-CM | POA: Diagnosis not present

## 2017-06-24 DIAGNOSIS — H1132 Conjunctival hemorrhage, left eye: Secondary | ICD-10-CM | POA: Diagnosis not present

## 2017-06-28 MED FILL — TOPIRAMATE 50 MG TABLET: 50 | 90 days supply | Qty: 180 | Fill #1

## 2017-07-01 ENCOUNTER — Telehealth: Payer: Self-pay | Admitting: *Deleted

## 2017-07-01 NOTE — Telephone Encounter (Signed)
I have added valtrex to pt's allergy list and will send to Dr. Jannifer Franklin for review.

## 2017-07-01 NOTE — Telephone Encounter (Signed)
I called the patient, talk with the wife.  The patient was on Valtrex, the creatinine apparently went up to 1.31, but this is identical to blood work that we have in epic from one year ago.  I would not alter the Topamax dosing.  The patient did have a nocturnal leg cramps last evening, if this becomes more common, we can add baclofen at night.

## 2017-07-01 NOTE — Telephone Encounter (Signed)
Wife is in office today being seen. She has asked that a message be given to Dr Jannifer Franklin, whom her husband sees for pseudotumor cerebri. She stated that her husband was given Valtrex for an infection but had reaction. His creatinine level was elevated @ 1.31 one week ago. He is off Valtrex now.  He has also had leg cramps which continue. He has been told to stay hydrated.  He has stopped exercising due to feeling poorly. The wife is asking if his topamax needs to be adjusted due to his "kidney issue" with elevated creatinine. She stated she is concerned, requested Dr Jannifer Franklin be made aware. Will send this note to RN working with Dr Jannifer Franklin today.

## 2017-07-05 ENCOUNTER — Telehealth: Payer: Self-pay | Admitting: Neurology

## 2017-07-05 NOTE — Telephone Encounter (Signed)
The patient has undergone a recent ophthalmologic evaluation revealing no evidence of papilledema.  This was done by Ruel Favors on 27 June 2017.

## 2017-07-25 DIAGNOSIS — T7840XD Allergy, unspecified, subsequent encounter: Secondary | ICD-10-CM | POA: Diagnosis not present

## 2017-08-01 DIAGNOSIS — G4733 Obstructive sleep apnea (adult) (pediatric): Secondary | ICD-10-CM | POA: Diagnosis not present

## 2017-08-02 DIAGNOSIS — Z9889 Other specified postprocedural states: Secondary | ICD-10-CM | POA: Diagnosis not present

## 2017-08-02 DIAGNOSIS — E230 Hypopituitarism: Secondary | ICD-10-CM | POA: Diagnosis not present

## 2017-08-02 DIAGNOSIS — E320 Persistent hyperplasia of thymus: Secondary | ICD-10-CM | POA: Diagnosis not present

## 2017-08-02 DIAGNOSIS — N289 Disorder of kidney and ureter, unspecified: Secondary | ICD-10-CM | POA: Diagnosis not present

## 2017-08-02 DIAGNOSIS — R768 Other specified abnormal immunological findings in serum: Secondary | ICD-10-CM | POA: Diagnosis not present

## 2017-08-11 MED FILL — LEVOTHYROXINE 75 MCG TABLET: 75 | 90 days supply | Qty: 90 | Fill #2

## 2017-09-13 ENCOUNTER — Telehealth: Payer: Self-pay | Admitting: *Deleted

## 2017-09-13 NOTE — Telephone Encounter (Signed)
errror

## 2017-09-13 NOTE — Telephone Encounter (Signed)
Pt FMLA form on Carl Knox desk. 

## 2017-09-21 DIAGNOSIS — R7989 Other specified abnormal findings of blood chemistry: Secondary | ICD-10-CM | POA: Diagnosis not present

## 2017-09-26 DIAGNOSIS — R7989 Other specified abnormal findings of blood chemistry: Secondary | ICD-10-CM | POA: Diagnosis not present

## 2017-09-28 MED FILL — TOPIRAMATE 50 MG TABLET: 50 | 90 days supply | Qty: 180 | Fill #2

## 2017-10-25 ENCOUNTER — Encounter: Payer: Self-pay | Admitting: Neurology

## 2017-10-25 ENCOUNTER — Ambulatory Visit: Payer: 59 | Admitting: Neurology

## 2017-10-25 VITALS — BP 133/83 | HR 70 | Ht 71.0 in | Wt 318.0 lb

## 2017-10-25 DIAGNOSIS — Z9989 Dependence on other enabling machines and devices: Secondary | ICD-10-CM

## 2017-10-25 DIAGNOSIS — G4733 Obstructive sleep apnea (adult) (pediatric): Secondary | ICD-10-CM | POA: Diagnosis not present

## 2017-10-25 NOTE — Patient Instructions (Addendum)
Please continue using your CPAP regularly. While your insurance requires that you use CPAP at least 4 hours each night on 70% of the nights, I recommend, that you not skip any nights and use it throughout the night if you can. Getting used to CPAP and staying with the treatment long term does take time and patience and discipline. Untreated obstructive sleep apnea when it is moderate to severe can have an adverse impact on cardiovascular health and raise her risk for heart disease, arrhythmias, hypertension, congestive heart failure, stroke and diabetes. Untreated obstructive sleep apnea causes sleep disruption, nonrestorative sleep, and sleep deprivation. This can have an impact on your day to day functioning and cause daytime sleepiness and impairment of cognitive function, memory loss, mood disturbance, and problems focussing. Using CPAP regularly can improve these symptoms.  Please try the best you can with the CPAP.  You may benefit from seeing ENT again for nasal congestion.  Try to lose weight.

## 2017-10-25 NOTE — Progress Notes (Signed)
Subjective:    Patient ID: Carl Knox is a 52 y.o. male.  HPI     Interim history:   Carl Knox is a 51 year old right-handed gentleman with an underlying medical history of pseudotumor cerebri, syncope, hypothyroidism and morbid obesity, who presents for follow-up consultation of his obstructive sleep apnea, on CPAP therapy at home. The patient is accompanied by his wife today. I last saw him on 11/02/2016, at which time we talked about his split-night sleep study from 08/20/2016. He was adequate with his CPAP compliance at the time and advised to follow-up in 6 months. He did report feeling somewhat better with CPAP therapy. He was working on weight loss.   He saw Carl Knox in the interim on 05/12/2017, at which time he was not fully compliant with CPAP.   Today, 10/25/2017: I reviewed his CPAP compliance data from 09/24/2017 through 10/23/2017 which is a total of 30 days, during which time he used his CPAP only 15 days with percent used days greater than 4 hours at 33%, indicating suboptimal to poor compliance with an average usage of 4 hours and 17 minutes for days on treatment. Residual AHI 0.1 per hour, leak high with the 95th percentile at 51 L/m on a pressure of 16 cm with EPR of 3. In the past 90 days his compliance has been similar. He reports having had sinus issues, nasal congestion. Had seen ENT in the past. Had an adverse reaction to Valtrex in the recent past including kidney impairment.    The patient's allergies, current medications, family history, past medical history, past social history, past surgical history and problem list were reviewed and updated as appropriate.    Previously (copied from previous notes for reference):    I first met him on 08/02/2016 at the request of Carl Knox and Dr. Jannifer Franklin, at which time he reported a prior diagnosis of obstructive sleep apnea and intolerance to CPAP and BiPAP in the past. I asked him to return for a sleep study. He had  a split-night sleep study on 08/20/2016. I went over his test results with him in detail today. Baseline sleep latency was 22.5 minutes, REM latency was 89.5 minutes, sleep efficiency for the diagnostic portion of the study was 89.7%. He had absence of slow-wave sleep and REM sleep was 10%. Total AHI was 66.2 per hour, average oxygen saturation 95%, nadir was 78%, he had no significant PLMS. He was fitted with a medium full face mask. CPAP was titrated from 5 cm to 17 cm. On a pressure of 16 cm his AHI was 0 per hour with supine REM sleep achieved an O2 nadir of 91%. Based on his test results I prescribed CPAP therapy for home use.     I reviewed his CPAP compliance data from 10/01/2016 through 10/30/2016 which is a total of 30 days, during which time he used his CPAP 28 days with percent used days greater than 4 hours at 77%, indicating adequate compliance with an average usage of 5 hours and 11 minutes, residual AHI low at 0.2 per hour, leak high with the 95th percentile at 43.3 L/m on a pressure of 16 cm with EPR of 3.    08/02/2016: (He) was previously diagnosed with obstructive sleep apnea in 1999 and placed on CPAP therapy. Prior sleep study results are not available for my review today. He has not been using CPAP. He was also tried on BiPAP but is not using BiPAP therapy there. He could not tolerate it.  A compliance download is not available for my review today. I reviewed your office note from 07/09/2016. His Epworth sleepiness score is 22 out of 24 today, fatigue score is 40 out of 63. His wife kept written records and states, his CPAP was started in 7/99 and then he was switched to BiPAP in 11/01, but did not tolerate either at the time, and had recurrent pneumonias. He has had worsening nasal congestion, had thyroglossal duct cyst surgery in Sept 2000, and FU with ENT in 2001. He had a TE as a child. He has nasal congestion, mouth breathing, also worsening. He Would be willing to get retested and  try CPAP again. His mother has sleep apnea and uses a CPAP machine. He has dozed off at the wheel briefly, no issues with sleepiness at work are reported. He works in [employer], educational, typically from 7 AM to 3 PM, once or twice per week over time with 12 hour shifts, 7 AM to 7 PM. Bedtime is late, around midnight, wakeup time for work between 5 and 6 AM. He was originally diagnosed with pseudotumor cerebri in 2011. He has gained weight in the past few years. He is a nonsmoker, drinks alcohol very occasionally, maybe once a month or so, so dose of a daily basis, 2-3 cans per day typically. He has nocturia about once or twice per hour tonight, denies morning headaches.     His Past Medical History Is Significant For: Past Medical History:  Diagnosis Date  . Morbid obesity ([place])   . OSA on CPAP   . Pseudotumor cerebri   . Syncope and collapse 02/06/2016    His Past Surgical History Is Significant For: Past Surgical History:  Procedure Laterality Date  . CYST EXCISION  2001   Thyglossal duct  . TONSILLECTOMY  1980    His Family History Is Significant For: Family History  Problem Relation Age of Onset  . Hypertension Mother   . Heart disease Mother   . Kidney disease Mother   . Diabetes Father   . Hypertension Father     His Social History Is Significant For: Social History   Socioeconomic History  . Marital status: Married    Spouse name: Not on file  . Number of children: 2  . Years of education: Some grad school  . Highest education level: Not on file  Occupational History  . Occupation: [employer]  . Financial resource strain: Not on file  . Food insecurity:    Worry: Not on file    Inability: Not on file  . Transportation needs:    Medical: Not on file    Non-medical: Not on file  Tobacco Use  . Smoking status: Never Smoker  . Smokeless tobacco: Never Used  Substance and Sexual Activity  . Alcohol use: Yes    Alcohol/week: 0.0 standard drinks    Comment:  Occasional  . Drug use: No  . Sexual activity: Not on file    Comment: Married  Lifestyle  . Physical activity:    Days per week: Not on file    Minutes per session: Not on file  . Stress: Not on file  Relationships  . Social connections:    Talks on phone: Not on file    Gets together: Not on file    Attends religious service: Not on file    Active member of club or organization: Not on file    Attends meetings of clubs or organizations: Not on file  Relationship status: Not on file  Other Topics Concern  . Not on file  Social History Narrative   Lives at home w/ his wife   Left-handed   Caffeine: 2 cups daily    His Allergies Are:  Allergies  Allergen Reactions  . Valtrex [Valacyclovir Hcl]   :   His Current Medications Are:  Outpatient Encounter Medications as of 10/25/2017  Medication Sig  . levothyroxine (SYNTHROID, LEVOTHROID) 75 MCG tablet Take 75 mcg by mouth daily.  . Multiple Vitamin (MULTIVITAMIN) tablet Take 1 tablet by mouth daily.  Marland Kitchen topiramate (TOPAMAX) 50 MG tablet Take 1 tablet (50 mg total) by mouth 2 (two) times daily.   No facility-administered encounter medications on file as of 10/25/2017.   :  Review of Systems:  Out of a complete 14 point review of systems, all are reviewed and negative with the exception of these symptoms as listed below:  Review of Systems  Neurological:       Pt presents today to discuss his cpap. Pt reports that his cpap is going well.    Objective:  Neurological Exam  Physical Exam Physical Examination:   Vitals:   10/25/17 1412  BP: 133/83  Pulse: 70    General Examination: The patient is a very pleasant 52 y.o. male in no acute distress. He appears well-developed and well-nourished and well groomed.   HEENT:Normocephalic, atraumatic, pupils are equal, round and reactive to light and accommodation. Extraocular tracking is good without limitation to gaze excursion or nystagmus noted. Normal smooth pursuit is  noted. Hearing is grossly intact. Face is symmetric with normal facial animation and normal facial sensation. Speech is clear with no dysarthria noted. There is no hypophonia. There is no lip, neck/head, jaw or voice tremor. Neck shows FROM. Oropharynx exam reveals: mildto moderate mouth dryness, adequatedental hygiene and markedairway crowding.Tongue protrudes centrally and palate elevates symmetrically. Nasal inspection shows mild inferior hypertrophy.   Chest:Clear to auscultation without wheezing, rhonchi or crackles noted.  Heart:S1+S2+0, regular and normal without murmurs, rubs or gallops noted.   Abdomen:Soft, non-tender and non-distended with normal bowel sounds appreciated on auscultation.  Extremities:There is nopitting edema in the distal lower extremities bilaterally.   Skin: Warm and dry without trophic changes noted.  Musculoskeletal: exam reveals no obvious joint deformities, tenderness or joint swelling or erythema.   Neurologically:  Mental status: The patient is awake, alert and oriented in all 4 spheres. Hisimmediate and remote memory, attention, language skills and fund of knowledge are appropriate. There is no evidence of aphasia, agnosia, apraxia or anomia. Speech is clear with normal prosody and enunciation. Thought process is linear. Mood is normaland affect is blunted.  Cranial nerves II - XII are as described above under HEENT exam.  Motor exam: Normal bulk, strength and tone is noted. There is no tremor. Fine motor and coordination: grossly intact.  Cerebellar testing: No dysmetria or intention tremor. There is no truncal or gait ataxia.  Sensory exam: intact to light touch in the upper and lower extremities.  Gait, station and balance: Hestands easily. No veering to one side is noted. No leaning to one side is noted. Posture is age-appropriate and stance is narrow based. Gait shows normalstride length and normalpace. No problems turning are noted.    Assessment and plan:  In summary, Dilan Novosad a very pleasant 52 year old male with an underlying medical history of pseudotumor cerebri, syncope, hypothyroidism and morbid obesity, who presents for follow-up consultation of his severe obstructive sleep apnea with  a prior diagnosis of this of years ago. He had a split-night sleep study in October 2018. He is currently not fully compliant with CPAP and is strongly encouraged to get back on track with it. He is motivated to continue CPAP therapy. He had some issues lately with sinus congestion and drainage and had some problems with his kidney function as I understand. I explained the importance of being compliant with PAP treatment, not only for insurance purposes but primarily to improve Hissymptoms, and for the patient's long term health benefit, including to reduce Hiscardiovascular risks. He Has lost a little bit of weight and is encouraged to continue to strive for weight loss. He is advised to be fully compliant with CPAP therapy and follow-up in about 5 or 6 months with Carl Knox, nurse practitioner. I answered all their questions today and the patient and his wife were in agreement. I spent 15 minutes in total face-to-face time with the patient, more than 50% of which was spent in counseling and coordination of care, reviewing test results, reviewing medication and discussing or reviewing the diagnosis of OSA, its prognosis and treatment options. Pertinent laboratory and imaging test results that were available during this visit with the patient were reviewed by me and considered in my medical decision making (see chart for details).

## 2017-11-01 DIAGNOSIS — M79644 Pain in right finger(s): Secondary | ICD-10-CM | POA: Diagnosis not present

## 2017-11-07 DIAGNOSIS — Z6841 Body Mass Index (BMI) 40.0 and over, adult: Secondary | ICD-10-CM | POA: Diagnosis not present

## 2017-11-07 DIAGNOSIS — E236 Other disorders of pituitary gland: Secondary | ICD-10-CM | POA: Diagnosis not present

## 2017-11-07 DIAGNOSIS — Z9889 Other specified postprocedural states: Secondary | ICD-10-CM | POA: Diagnosis not present

## 2017-11-07 DIAGNOSIS — E669 Obesity, unspecified: Secondary | ICD-10-CM | POA: Diagnosis not present

## 2017-11-07 DIAGNOSIS — E039 Hypothyroidism, unspecified: Secondary | ICD-10-CM | POA: Diagnosis not present

## 2017-11-10 DIAGNOSIS — G4733 Obstructive sleep apnea (adult) (pediatric): Secondary | ICD-10-CM | POA: Diagnosis not present

## 2017-11-10 MED FILL — LEVOTHYROXINE 75 MCG TABLET: 75 | 90 days supply | Qty: 90 | Fill #3

## 2017-12-13 ENCOUNTER — Encounter: Payer: Self-pay | Admitting: Neurology

## 2017-12-13 ENCOUNTER — Other Ambulatory Visit: Payer: Self-pay

## 2017-12-13 ENCOUNTER — Ambulatory Visit: Payer: 59 | Admitting: Neurology

## 2017-12-13 VITALS — BP 118/71 | HR 67 | Resp 18 | Ht 71.0 in | Wt 315.0 lb

## 2017-12-13 DIAGNOSIS — G932 Benign intracranial hypertension: Secondary | ICD-10-CM | POA: Diagnosis not present

## 2017-12-13 NOTE — Patient Instructions (Signed)
We will go to 50 mg of topamax daily for 4 weeks, then stop the medication.

## 2017-12-13 NOTE — Progress Notes (Signed)
Reason for visit: Pseudotumor cerebri  Carl Knox is an 52 y.o. male  History of present illness:  Carl Knox is a 52 year old left-handed black male with a history of obesity and sleep apnea on CPAP.  The patient has had episodes of syncope in the past associated with laughing.  The patient was found to have an opening pressure of 36 cm, he was placed on Lasix, but currently he is now on Topamax taking 50 mg twice daily.  He last had an ophthalmologic evaluation in June 2019, at that time, there was no evidence of papilledema.  The patient feels well, he has not had any further syncopal events.  He denies any headaches or visual disturbances.  He is trying to work out, he has lost some weight.  He returns to this office for an evaluation.  Past Medical History:  Diagnosis Date  . Morbid obesity ([place])   . OSA on CPAP   . Pseudotumor cerebri   . Syncope and collapse 02/06/2016    Past Surgical History:  Procedure Laterality Date  . CYST EXCISION  2001   Thyglossal duct  . TONSILLECTOMY  1980    Family History  Problem Relation Age of Onset  . Hypertension Mother   . Heart disease Mother   . Kidney disease Mother   . Diabetes Father   . Hypertension Father     Social history:  reports that he has never smoked. He has never used smokeless tobacco. He reports that he drinks alcohol. He reports that he does not use drugs.    Allergies  Allergen Reactions  . Valtrex [Valacyclovir Hcl]     Medications:  Prior to Admission medications   Medication Sig Start Date End Date Taking? Authorizing Provider  levothyroxine (SYNTHROID, LEVOTHROID) 75 MCG tablet Take 75 mcg by mouth daily. 10/12/16  Yes [provider]  Multiple Vitamin (MULTIVITAMIN) tablet Take 1 tablet by mouth daily.   Yes [provider]  topiramate (TOPAMAX) 50 MG tablet Take 1 tablet (50 mg total) by mouth 2 (two) times daily. 03/25/17  Yes Rosalin Hawking, MD    ROS:  Out of a complete 14  system review of symptoms, the patient complains only of the following symptoms, and all other reviewed systems are negative.  Sleep apnea  Blood pressure 118/71, pulse 67, resp. rate 18, height 5\' 11"  (1.803 m), weight (!) 315 lb (142.9 kg).  Physical Exam  General: The patient is alert and cooperative at the time of the examination.  Skin: No significant peripheral edema is noted.   Neurologic Exam  Mental status: The patient is alert and oriented x 3 at the time of the examination. The patient has apparent normal recent and remote memory, with an apparently normal attention span and concentration ability.   Cranial nerves: Facial symmetry is present. Speech is normal, no aphasia or dysarthria is noted. Extraocular movements are full. Visual fields are full.  Pupils are equal, round, and reactive to light.  Discs are flat bilaterally.  Motor: The patient has good strength in all 4 extremities.  Sensory examination: Soft touch sensation is symmetric on the face, arms, and legs.  Coordination: The patient has good finger-nose-finger and heel-to-shin bilaterally.  Gait and station: The patient has a normal gait. Tandem gait is normal. Romberg is negative. No drift is seen.  Reflexes: Deep tendon reflexes are symmetric.   Assessment/Plan:  1.  Pseudotumor cerebri, resolved  2.  Sleep apnea on CPAP  3.  Obesity  The patient is trying to lose weight, he is working out on a regular basis.  The last ophthalmologic evaluation showed resolution of the papilledema, he will be seen by his ophthalmologist next week.  If this is once again confirmed, the patient will initiate a taper off of the Topamax going to 50 mg daily for 4 weeks and then stop the drug.  The patient will follow-up in 6 months, if he has continued to do well at that time, we will see him on an as-needed basis.  Jill Alexanders MD 12/13/2017 11:33 AM  [facility] [address] [place],  [address]  Phone [phone] Fax [phone]

## 2017-12-16 IMAGING — MR MR ANKLE*R* W/O CM
4 of 5 series · 19 of 40 positions shown · non-contrast
Comparison: None.

CLINICAL DATA: Fell downstairs 06/29/2015 and injured ankle and
foot. Persistent pain.

EXAM:
MRI OF THE RIGHT ANKLE WITHOUT CONTRAST
TECHNIQUE: Multiplanar, multisequence MR imaging of the ankle was performed. No
intravenous contrast was administered.

[Series 3: T2 fat-sat · sagittal · 4.0mm · 0.33mm/px · 5 of 20 slices shown (1 of 3)]
[im 1/20]
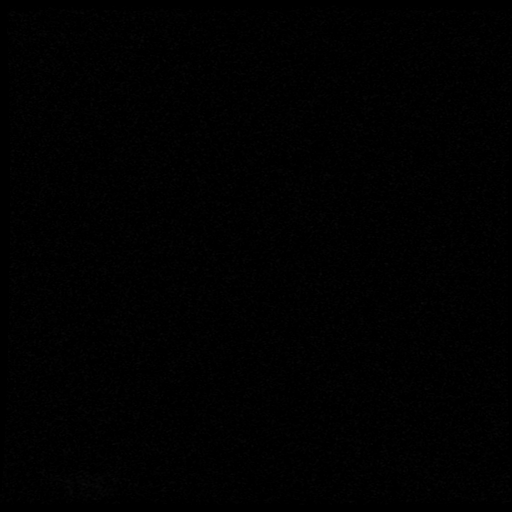
[im 4/20]
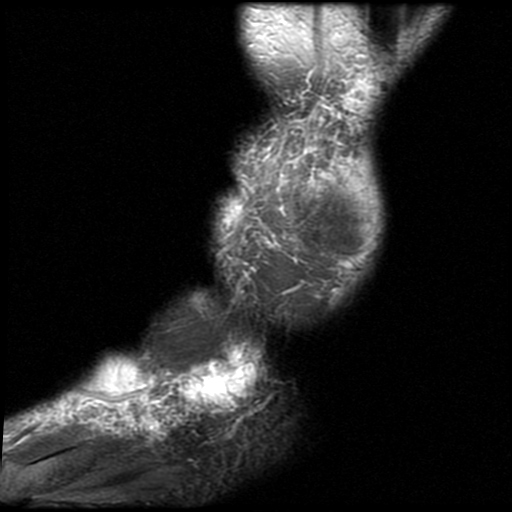
[im 8/20]
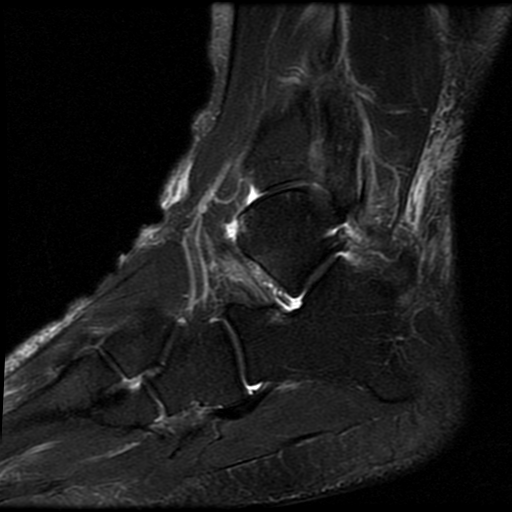
[im 12/20]
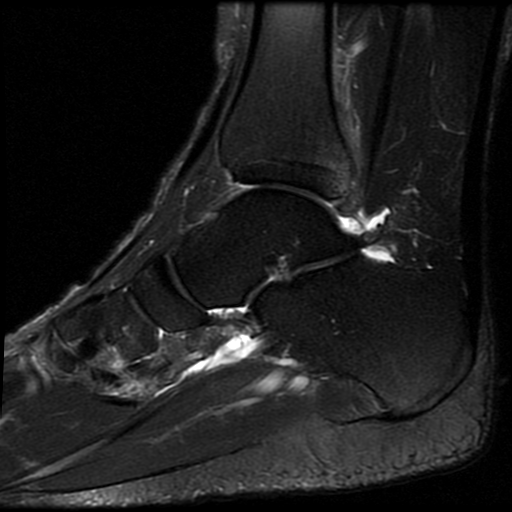
[im 20/20]
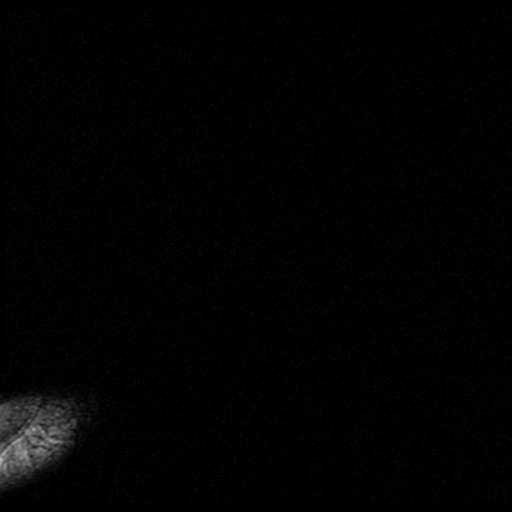

[Series 4: PD · axial · 4.0mm · 0.33mm/px · z∈[-124,+4]mm · 8 of 27 slices shown]
[im 1/27]
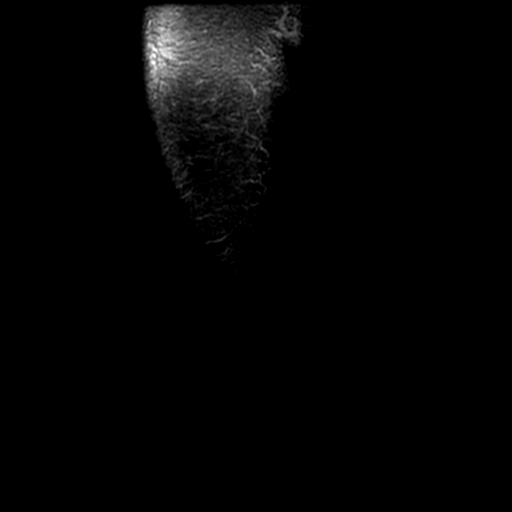
[im 4/27]
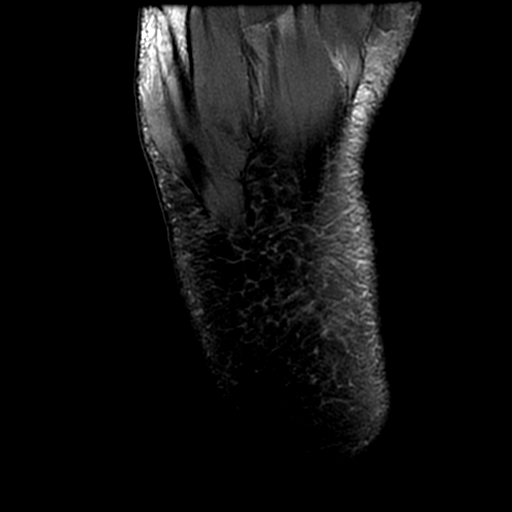
[im 8/27]
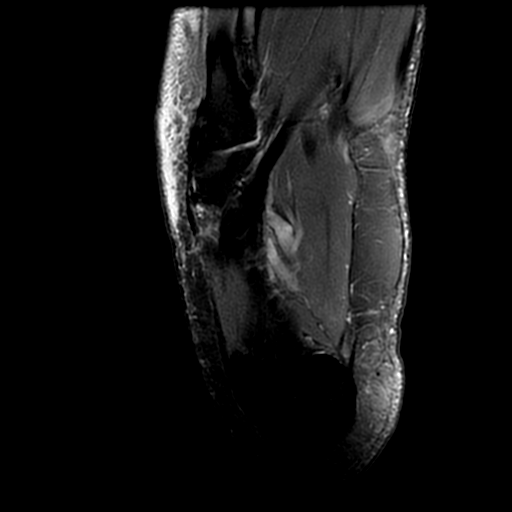
[im 12/27]
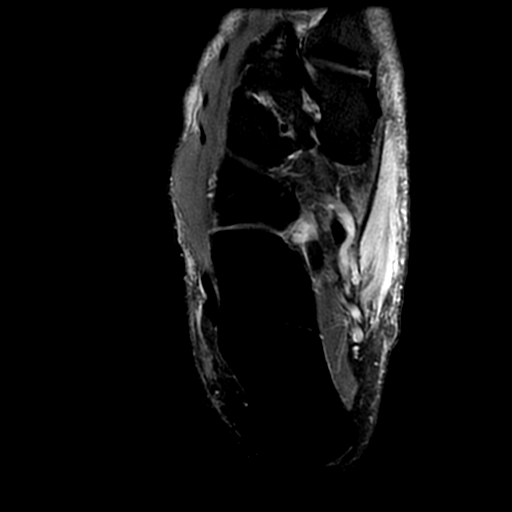
[im 15/27]
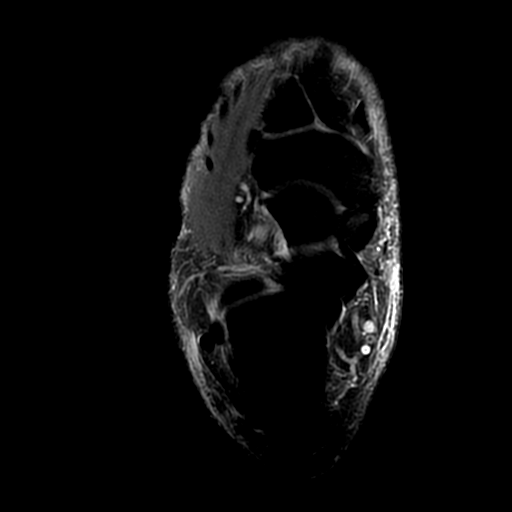
[im 19/27]
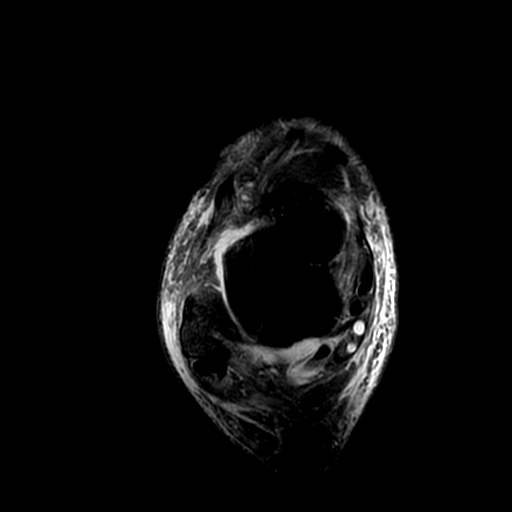
[im 23/27]
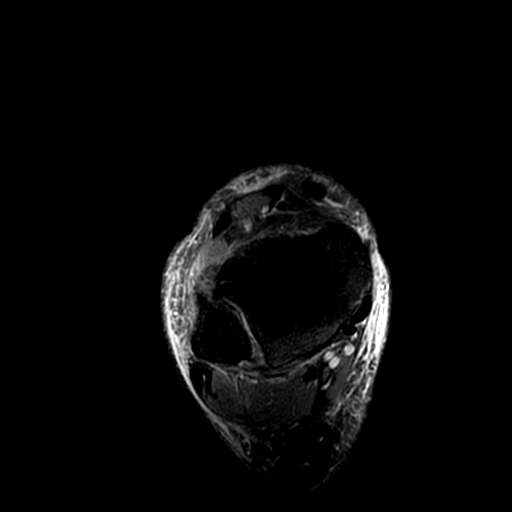
[im 27/27]
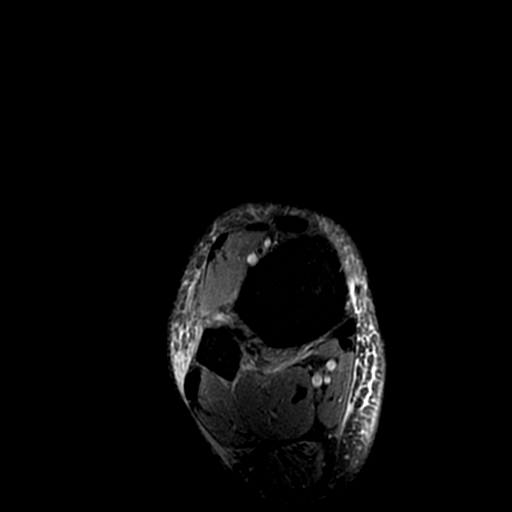

[Series 5: T2 fat-sat · axial · 4.0mm · 0.33mm/px · z∈[-109,-16]mm · 3 of 27 slices shown (2 of 3)]
[im 4/27]
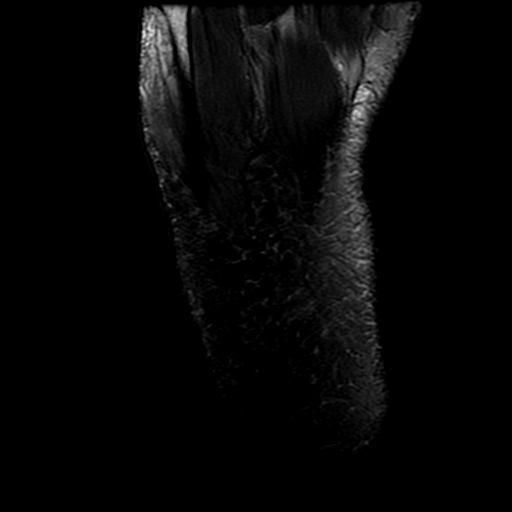
[im 15/27]
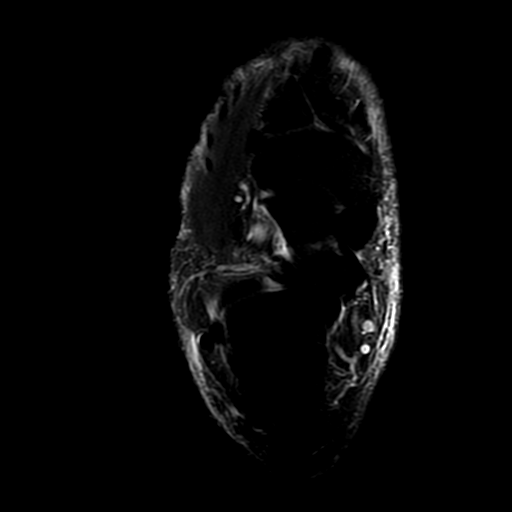
[im 23/27]
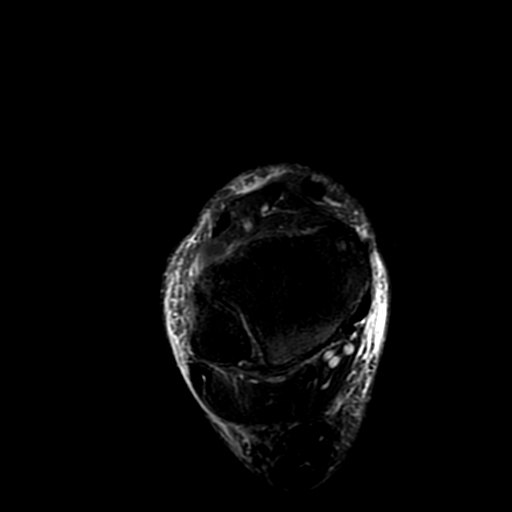

[Series 7: T2 fat-sat · coronal · 4.0mm · 0.29mm/px · 3 of 34 slices shown (3 of 3)]
[im 4/34]
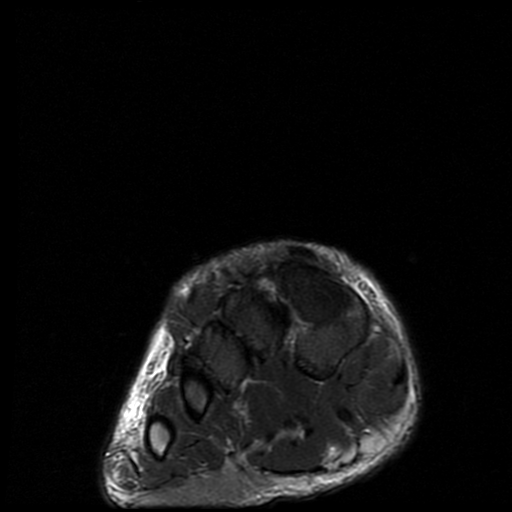
[im 17/34]
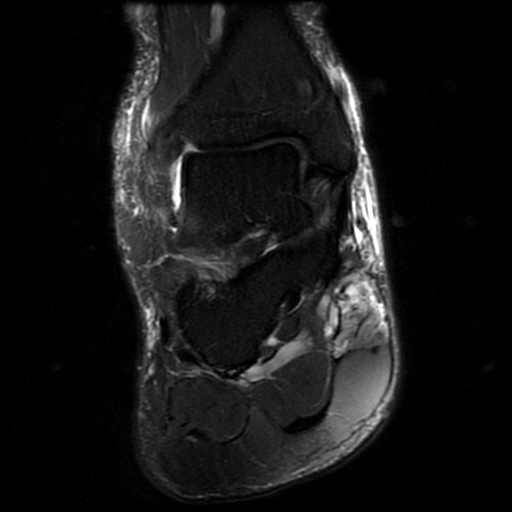
[im 30/34]
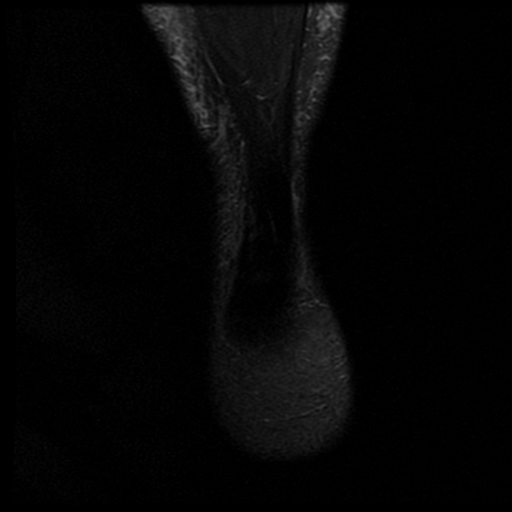

[19 of 40 positions shown; findings below may reference images not displayed]

FINDINGS: Intact.

TENDONS

Peroneal: Intact.

Posteromedial: Intact.

Anterior: Intact.

Achilles: Intact.

Plantar Fascia: Intact.

LIGAMENTS

Lateral: The anterior and posterior talofibular ligaments are
intact. The anterior tibiofibular ligament appears chronically torn.
It is thickened and markedly attenuated with no definite intact
fibers. The posterior tibiofibular ligament appears intact.

Medial: Intact.

CARTILAGE

Ankle Joint: No significant degenerative changes. No cartilage
defects or osteochondral lesion. No joint effusion.

Subtalar Joints/Sinus Tarsi: Mild subtalar joint effusions but no
significant degenerative changes. The sinus tarsi is normal. The
cervical and interosseous ligaments are intact. The spring ligament
is intact.

Bones: No stress fracture or AVN.  No osteochondral lesions.

Other: There is marked edema like signal abnormality involving the
adductor hallucis muscle. This extends from the attachment region
near the plantar fascia although way to the base of the first
metatarsal. The tendons appear intact. This is most likely a severe
muscles sprain/muscle tear. If the patient is diabetic this could
also be a muscle infarct. The flexor digitorum brevis muscle appears
normal.
IMPRESSION: 1. Torn anterior tibiofibular ligament. The posterior tibiofibular
ligament and anterior posterior talofibular ligaments are intact.
2. Intact medial and lateral ankle tendons.
3. Marked edema like signal abnormality in the adductor hallucis
muscle likely a severe muscle strain or partial tear.

## 2017-12-23 DIAGNOSIS — G932 Benign intracranial hypertension: Secondary | ICD-10-CM | POA: Diagnosis not present

## 2018-01-16 ENCOUNTER — Telehealth: Payer: Self-pay | Admitting: Neurology

## 2018-01-16 NOTE — Telephone Encounter (Signed)
I called the patient, talk with the wife.  The ophthalmology evaluation revealed essentially normal discs, for this reason, the Topamax will be tapered to 1 a day for 2 weeks and then stop.

## 2018-02-03 DIAGNOSIS — G4733 Obstructive sleep apnea (adult) (pediatric): Secondary | ICD-10-CM | POA: Diagnosis not present

## 2018-02-03 DIAGNOSIS — E230 Hypopituitarism: Secondary | ICD-10-CM | POA: Diagnosis not present

## 2018-02-03 DIAGNOSIS — Z1211 Encounter for screening for malignant neoplasm of colon: Secondary | ICD-10-CM | POA: Diagnosis not present

## 2018-02-03 DIAGNOSIS — E236 Other disorders of pituitary gland: Secondary | ICD-10-CM | POA: Diagnosis not present

## 2018-02-03 DIAGNOSIS — Z125 Encounter for screening for malignant neoplasm of prostate: Secondary | ICD-10-CM | POA: Diagnosis not present

## 2018-02-03 DIAGNOSIS — E039 Hypothyroidism, unspecified: Secondary | ICD-10-CM | POA: Diagnosis not present

## 2018-02-03 DIAGNOSIS — Z0000 Encounter for general adult medical examination without abnormal findings: Secondary | ICD-10-CM | POA: Diagnosis not present

## 2018-02-03 DIAGNOSIS — E669 Obesity, unspecified: Secondary | ICD-10-CM | POA: Diagnosis not present

## 2018-02-06 ENCOUNTER — Telehealth: Payer: Self-pay | Admitting: Neurology

## 2018-02-06 ENCOUNTER — Other Ambulatory Visit: Payer: Self-pay | Admitting: Neurology

## 2018-02-06 NOTE — Telephone Encounter (Signed)
I called the wife.  The patient is to stop the Topamax, see the ophthalmologist, if any concerns that there is a return of the papilledema, they are to contact me.

## 2018-02-06 NOTE — Telephone Encounter (Signed)
I contacted the pt's wife. She wanted to clarify Topamax dosage. I reviewed with pt's wife the message from 01/16/2018 where Dr. Jannifer Franklin stated to take Topamax 50 mg 1 time daily then d/c. Pt's wife stated this was not what she remembered being discuss. She stated she was under the impression the pt would continue the Topamax until after he saw the ophthalmologist which he has schedule for 02/17/18. I reiterated pt was to d/c med per Dr. Jannifer Franklin after taking 1 tablet of the 50 mg for 2 weeks.  Wife is now asking for further clarification, she states the pt is going to be out of his medication tomorrow and needs to verify what he should be taking. I confirmed the pt tapered down to the 50 mg 1 tablet daily on 12/28/18 and has been taking 1 tablet since then.

## 2018-02-06 NOTE — Telephone Encounter (Signed)
Pt wife is asking for a call for clarity on pt's topiramate (TOPAMAX) 50 MG tablet

## 2018-02-07 MED FILL — LEVOTHYROXINE 75 MCG TABLET: 75 | 90 days supply | Qty: 90 | Fill #0

## 2018-02-15 DIAGNOSIS — G4733 Obstructive sleep apnea (adult) (pediatric): Secondary | ICD-10-CM | POA: Diagnosis not present

## 2018-02-17 ENCOUNTER — Ambulatory Visit (INDEPENDENT_AMBULATORY_CARE_PROVIDER_SITE_OTHER): Payer: 59

## 2018-02-17 ENCOUNTER — Ambulatory Visit: Payer: 59 | Admitting: Podiatry

## 2018-02-17 ENCOUNTER — Encounter: Payer: Self-pay | Admitting: Podiatry

## 2018-02-17 ENCOUNTER — Encounter

## 2018-02-17 DIAGNOSIS — L603 Nail dystrophy: Secondary | ICD-10-CM

## 2018-02-17 DIAGNOSIS — G932 Benign intracranial hypertension: Secondary | ICD-10-CM | POA: Diagnosis not present

## 2018-02-17 DIAGNOSIS — M7751 Other enthesopathy of right foot: Secondary | ICD-10-CM

## 2018-02-17 DIAGNOSIS — B079 Viral wart, unspecified: Secondary | ICD-10-CM | POA: Diagnosis not present

## 2018-02-17 NOTE — Progress Notes (Signed)
Subjective:   Patient ID: Carl Knox, male   DOB: 53 y.o.   MRN: [id number]   HPI Patient presents concerned about a thick big toe nail left and on the bottom of the right it feels like there is a knot within his toe and its been painful and making it hard to walk comfortably.  Does not remember specific injury   ROS      Objective:  Physical Exam  Neurovascular status intact with 2 separate problems with one being keratotic lesion left with mycotic nail infection and the other being lesion underneath the right foot that is painful and hard to walk     Assessment:  Mycotic nail infection left with lesion formation right that upon debridement shows pinpoint bleeding pain to lateral pressure     Plan:  Reviewed both conditions and for the left I do not recommend treatment as it is localized and related to trauma and for the right I did debride the lesion fully I took it out and then I applied chemical agent to create immune response with sterile dressing and if it were to persist or get worse he is to reappoint and hopefully this will solve the problem  X-ray with right foot was negative for the fact that this is a bone spur and it appears to be a soft tissue lesion

## 2018-03-02 ENCOUNTER — Telehealth: Payer: Self-pay | Admitting: Neurology

## 2018-03-02 NOTE — Telephone Encounter (Signed)
I received a letter from Dr. Valetta Close from ophthalmology, the patient was evaluated on 21 February 2018.  Evaluation showed good resolution of papilledema off of Topamax.  The pseudotumor cerebri appears to have resolved.

## 2018-03-21 ENCOUNTER — Telehealth: Payer: Self-pay | Admitting: *Deleted

## 2018-03-21 NOTE — Telephone Encounter (Signed)
FMLA for spouse completed and to be signed. Dr. Jannifer Franklin last seen.  Placed in his inbox.

## 2018-03-21 NOTE — Telephone Encounter (Signed)
FMLA form was signed.

## 2018-03-22 NOTE — Telephone Encounter (Signed)
Completed, signed and to MR.

## 2018-04-03 ENCOUNTER — Ambulatory Visit: Payer: 59 | Admitting: Nurse Practitioner

## 2018-04-04 ENCOUNTER — Encounter: Payer: Self-pay | Admitting: Neurology

## 2018-04-04 NOTE — Progress Notes (Addendum)
[facility]  PATIENT: Carl Knox DOB: 26-Dec-1965   REASON FOR VISIT: Follow-up for obstructive sleep apnea HISTORY FROM:patient    HISTORY OF PRESENT ILLNESS: Carl Knox is a 53 year old right-handed gentleman with an underlying medical history of pseudotumor cerebri, syncope, hypothyroidism and morbid obesity, who presents for follow-up consultation of his obstructive sleep apnea, on CPAP therapy at home. The patient is accompanied by his wife today. I last saw him on 11/02/2016, at which time we talked about his split-night sleep study from 08/20/2016. He was adequate with his CPAP compliance at the time and advised to follow-up in 6 months. He did report feeling somewhat better with CPAP therapy. He was working on weight loss.   He saw Cecille Rubin in the interim on 05/12/2017, at which time he was not fully compliant with CPAP.   Today, 10/25/2017:SA I reviewed his CPAP compliance data from 09/24/2017 through 10/23/2017 which is a total of 30 days, during which time he used his CPAP only 15 days with percent used days greater than 4 hours at 33%, indicating suboptimal to poor compliance with an average usage of 4 hours and 17 minutes for days on treatment. Residual AHI 0.1 per hour, leak high with the 95th percentile at 51 L/m on a pressure of 16 cm with EPR of 3. In the past 90 days his compliance has been similar. He reports having had sinus issues, nasal congestion. Had seen ENT in the past. Had an adverse reaction to Valtrex in the recent past including kidney impairment.  UPDATE 3/18/2020CM Carl Knox, 53 year old male returns for follow-up with history of obstructive sleep apnea here for CPAP compliance.  Compliance data dated 03/06/2018 04/04/2018 shows compliance greater than 4 hours at 40% which is suboptimal to poor compliance.  Average usage 4 hours.  Set pressure 16 cm.  Leak 95th percentile at 50.  AHI 0.3.  ESS 3 patient claims that he works several  different shifts and that sometimes he just does not put his CPAP on because he may have to go back to work in a couple of hours.  He returns for reevaluation  REVIEW OF SYSTEMS: Full 14 system review of systems performed and notable only for those listed, all others are neg:  Constitutional: neg  Cardiovascular: neg Ear/Nose/Throat: neg  Skin: neg Eyes: neg Respiratory: neg Gastroitestinal: neg  Hematology/Lymphatic: neg  Endocrine: neg Musculoskeletal:neg Allergy/Immunology: neg Neurological: neg Psychiatric: neg Sleep :  obstructive sleep apnea with CPAP    ALLERGIES: Allergies  Allergen Reactions  . Valtrex [Valacyclovir Hcl]     HOME MEDICATIONS: Outpatient Medications Prior to Visit  Medication Sig Dispense Refill  . levothyroxine (SYNTHROID, LEVOTHROID) 75 MCG tablet Take 75 mcg by mouth daily.    . Multiple Vitamin (MULTIVITAMIN) tablet Take 1 tablet by mouth daily.     No facility-administered medications prior to visit.     PAST MEDICAL HISTORY: Past Medical History:  Diagnosis Date  . Morbid obesity ([place])   . OSA on CPAP   . Pseudotumor cerebri   . Syncope and collapse 02/06/2016    PAST SURGICAL HISTORY: Past Surgical History:  Procedure Laterality Date  . CYST EXCISION  2001   Thyglossal duct  . TONSILLECTOMY  1980    FAMILY HISTORY: Family History  Problem Relation Age of Onset  . Hypertension Mother   . Heart disease Mother   . Kidney disease Mother   . Diabetes Father   . Hypertension Father     SOCIAL HISTORY:  Social History   Socioeconomic History  . Marital status: Married    Spouse name: Not on file  . Number of children: 2  . Years of education: Some grad school  . Highest education level: Not on file  Occupational History  . Occupation: [employer]  . Financial resource strain: Not on file  . Food insecurity:    Worry: Not on file    Inability: Not on file  . Transportation needs:    Medical: Not on file     Non-medical: Not on file  Tobacco Use  . Smoking status: Never Smoker  . Smokeless tobacco: Never Used  Substance and Sexual Activity  . Alcohol use: Yes    Alcohol/week: 0.0 standard drinks    Comment: Occasional  . Drug use: No  . Sexual activity: Not on file    Comment: Married  Lifestyle  . Physical activity:    Days per week: Not on file    Minutes per session: Not on file  . Stress: Not on file  Relationships  . Social connections:    Talks on phone: Not on file    Gets together: Not on file    Attends religious service: Not on file    Active member of club or organization: Not on file    Attends meetings of clubs or organizations: Not on file    Relationship status: Not on file  . Intimate partner violence:    Fear of current or ex partner: Not on file    Emotionally abused: Not on file    Physically abused: Not on file    Forced sexual activity: Not on file  Other Topics Concern  . Not on file  Social History Narrative   Lives at home w/ his wife   Left-handed   Caffeine: 2 cups daily     PHYSICAL EXAM  Vitals:   04/05/18 1250  BP: 117/71  Pulse: 64  Weight: (!) 328 lb (148.8 kg)  Height: 5\' 11"  (1.803 m)   Body mass index is 45.75 kg/m.  Generalized: Well developed, morbidly obese male in no acute distress  Head: normocephalic and atraumatic,. Oropharynx benign  Neck: Supple,  Musculoskeletal: No deformity  Skin no rash or edema Neurological examination   Mentation: Alert oriented to time, place, history taking. Attention span and concentration appropriate. Recent and remote memory intact.  Follows all commands speech and language fluent.   Cranial nerve II-XII: Pupils were equal round reactive to light extraocular movements were full, visual field were full on confrontational test. Facial sensation and strength were normal. hearing was intact to finger rubbing bilaterally. Uvula tongue midline. head turning and shoulder shrug were normal and  symmetric.Tongue protrusion into cheek strength was normal. Motor: normal bulk and tone, full strength in the BUE, BLE,  Sensory: normal and symmetric to light touch,  Coordination: finger-nose-finger, heel-to-shin bilaterally, no dysmetria Gait and Station: Rising up from seated position without assistance, normal stance,  moderate stride, good arm swing, smooth turning, able to perform tiptoe, and heel walking without difficulty. Tandem gait is steady  DIAGNOSTIC DATA (LABS, IMAGING, TESTING) - I reviewed patient records, labs, notes, testing and imaging myself where available.  Lab Results  Component Value Date   WBC 4.5 04/27/2016   HGB 14.0 04/27/2016   HCT 40.9 04/27/2016   MCV 77 (L) 04/27/2016   PLT 278 04/27/2016      Component Value Date/Time   NA 141 04/27/2016 1136   K  5.1 04/27/2016 1136   CL 102 04/27/2016 1136   CO2 24 04/27/2016 1136   GLUCOSE 95 04/27/2016 1136   BUN 14 04/27/2016 1136   CREATININE 1.31 (H) 04/27/2016 1136   CALCIUM 9.2 04/27/2016 1136   PROT 7.4 04/27/2016 1136   ALBUMIN 4.3 04/27/2016 1136   AST 28 04/27/2016 1136   ALT 22 04/27/2016 1136   ALKPHOS 94 04/27/2016 1136   BILITOT 0.6 04/27/2016 1136   GFRNONAA 63 04/27/2016 1136   GFRAA 73 04/27/2016 1136    ASSESSMENT AND PLAN Carl Cooperis a very pleasant 53 year old male with an underlying medical history of pseudotumor cerebri, syncope, hypothyroidism and morbid obesity, who presents for follow-up consultation of his severe obstructive sleep apnea. He had a split-night sleep study in October 2018. He is currently not fully compliant with CPAP and is strongly encouraged to get back on track with it.Compliance data dated 03/06/2018 04/04/2018 shows compliance greater than 4 hours at 40% which is suboptimal to poor compliance.  Average usage 4 hours.  Set pressure 16 cm.  Leak 95th percentile at 50.  AHI 0.3.  ESS 3  He is motivated to continue CPAP therapy. He he claims that he works different  shifts at his employer and sometimes only has 4 hours from 1 shift to the next to get some sleep therefore he does not go to sleep.  He has also had issues with  sinus congestion and drainage. I explained the importance of being compliant with PAP treatment, not only for insurance purposes but primarily to improve Hissymptoms, and for the patient's long term health benefit, including to reduce Hiscardiovascular risks.I answered all his  questions today. I spent 15 minutes in total face-to-face time with the patient   PLAN: CPAP compliance 50% Continue same settings Try to use the mask more often even when you only have a couple of hours.  Follow-up in 6 months Dennie Bible, [facility], [facility], APRN  [facility] [address] [place], [place] [address] [phone]  I reviewed the above note and documentation by the Nurse Practitioner and agree with the history, physical exam, assessment and plan as outlined above. I was immediately available for face-to-face consultation. Star Age, MD, PhD [facility])

## 2018-04-05 ENCOUNTER — Encounter: Payer: Self-pay | Admitting: Nurse Practitioner

## 2018-04-05 ENCOUNTER — Other Ambulatory Visit: Payer: Self-pay

## 2018-04-05 ENCOUNTER — Ambulatory Visit: Payer: 59 | Admitting: Nurse Practitioner

## 2018-04-05 VITALS — BP 117/71 | HR 64 | Ht 71.0 in | Wt 328.0 lb

## 2018-04-05 DIAGNOSIS — Z9989 Dependence on other enabling machines and devices: Secondary | ICD-10-CM

## 2018-04-05 DIAGNOSIS — G4733 Obstructive sleep apnea (adult) (pediatric): Secondary | ICD-10-CM

## 2018-04-05 NOTE — Patient Instructions (Signed)
CPAP compliance 50% Continue same settings Try to use the mask more often Follow-up in 6 months

## 2018-04-13 ENCOUNTER — Ambulatory Visit: Payer: 59 | Admitting: Nurse Practitioner

## 2018-05-11 MED FILL — LEVOTHYROXINE 75 MCG TABLET: 75 | 90 days supply | Qty: 90 | Fill #0

## 2018-05-15 DIAGNOSIS — J310 Chronic rhinitis: Secondary | ICD-10-CM | POA: Diagnosis not present

## 2018-05-15 MED FILL — FLUTICASONE PROP 50 MCG SPR: 50 | 30 days supply | Qty: 16 | Fill #0

## 2018-05-17 DIAGNOSIS — G4733 Obstructive sleep apnea (adult) (pediatric): Secondary | ICD-10-CM | POA: Diagnosis not present

## 2018-07-04 DIAGNOSIS — G932 Benign intracranial hypertension: Secondary | ICD-10-CM | POA: Diagnosis not present

## 2018-07-06 MED FILL — FLUTICASONE PROP 50 MCG SPR: 50 | 30 days supply | Qty: 16 | Fill #0

## 2018-07-10 ENCOUNTER — Telehealth: Payer: Self-pay | Admitting: Neurology

## 2018-07-10 NOTE — Telephone Encounter (Signed)
Due to current COVID 19 pandemic, our office is severely reducing in office visits until further notice, in order to minimize the risk to our patients and healthcare providers.   I called patient and spoke with wife regarding 6/23 appt. I offered a virtual visit. Wife declined and stated that it may be best if pt comes in office as he has been having issues and has recently visited the ophthalmologist. Patient will come in office on 6/23. Wife verbalized understanding of the precautions our office is taking for in office visits.

## 2018-07-10 NOTE — Telephone Encounter (Signed)
Noted  

## 2018-07-11 ENCOUNTER — Encounter: Payer: Self-pay | Admitting: Neurology

## 2018-07-11 ENCOUNTER — Telehealth: Payer: Self-pay | Admitting: Neurology

## 2018-07-11 ENCOUNTER — Ambulatory Visit: Payer: 59 | Admitting: Neurology

## 2018-07-11 ENCOUNTER — Other Ambulatory Visit: Payer: Self-pay

## 2018-07-11 VITALS — BP 128/84 | HR 63 | Temp 97.9°F | Ht 71.0 in | Wt 340.0 lb

## 2018-07-11 DIAGNOSIS — H4710 Unspecified papilledema: Secondary | ICD-10-CM | POA: Diagnosis not present

## 2018-07-11 DIAGNOSIS — G932 Benign intracranial hypertension: Secondary | ICD-10-CM | POA: Diagnosis not present

## 2018-07-11 MED ORDER — TOPIRAMATE 50 MG PO TABS: 50.0000 mg | ORAL_TABLET | Freq: Two times a day (BID) | ORAL | 3 refills | Status: DC

## 2018-07-11 MED FILL — TOPIRAMATE 50 MG TABLET: 50 | 90 days supply | Qty: 180 | Fill #0

## 2018-07-11 NOTE — Patient Instructions (Signed)
We will start Topamax 50 mg tablet, one at night for 2 weeks, then take one twice a day.  Topamax (topiramate) is a seizure medication that has an FDA approval for seizures and for migraine headache. Potential side effects of this medication include weight loss, cognitive slowing, tingling in the fingers and toes, and carbonated drinks will taste bad. If any significant side effects are noted on this drug, please contact our office.

## 2018-07-11 NOTE — Progress Notes (Signed)
Reason for visit: Papilledema, pseudotumor cerebri  Carl Knox is an 53 y.o. male  History of present illness:  Carl Knox is a 53 year old left-handed black male with a history of obesity, on CPAP.  The patient has in the past had syncope associated with vigorous laughing episodes.  He was found to have an opening pressure that was elevated around 36 cm.  The patient was placed on Topamax and seemed to improve his symptoms.  His papilledema disappeared, on his last ophthalmologic evaluation in February 2020, he had no papilledema and therefore was taken off of Topamax.  His most recent evaluation on 04 July 2018 showed some returning bilateral papilledema indicating elevating intracranial pressures.  He comes back in today for an evaluation.  He is having occasional dizzy episodes with standing up, he has not had any further episodes of syncope.  He remains on CPAP.  He has not been exercising recently, he has gained 25 pounds of weight since November 2019.  He denies any numbness or weakness of the extremities with exception of numbness of the toe on 1 of his feet.  He denies any balance issues or difficulty controlling the bowels or the bladder.  He has good strength throughout.  He denies any vision changes or any double vision.  Past Medical History:  Diagnosis Date  . Morbid obesity ([place])   . OSA on CPAP   . Pseudotumor cerebri   . Syncope and collapse 02/06/2016    Past Surgical History:  Procedure Laterality Date  . CYST EXCISION  2001   Thyglossal duct  . TONSILLECTOMY  1980    Family History  Problem Relation Age of Onset  . Hypertension Mother   . Heart disease Mother   . Kidney disease Mother   . Diabetes Father   . Hypertension Father     Social history:  reports that he has never smoked. He has never used smokeless tobacco. He reports current alcohol use. He reports that he does not use drugs.    Allergies  Allergen Reactions  . Valtrex [Valacyclovir Hcl]     Medications:  Prior to Admission medications   Medication Sig Start Date End Date Taking? Authorizing Provider  levothyroxine (SYNTHROID, LEVOTHROID) 75 MCG tablet Take 75 mcg by mouth daily. 10/12/16  Yes [provider]  Multiple Vitamin (MULTIVITAMIN) tablet Take 1 tablet by mouth daily.   Yes [provider]    ROS:  Out of a complete 14 system review of symptoms, the patient complains only of the following symptoms, and all other reviewed systems are negative.  Episodic dizziness  Blood pressure 128/84, pulse 63, temperature 97.9 F (36.6 C), temperature source Oral, height 5\' 11"  (1.803 m), weight (!) 340 lb (154.2 kg).  Physical Exam  General: The patient is alert and cooperative at the time of the examination.  The patient is markedly obese.  Skin: No significant peripheral edema is noted.   Neurologic Exam  Mental status: The patient is alert and oriented x 3 at the time of the examination. The patient has apparent normal recent and remote memory, with an apparently normal attention span and concentration ability.   Cranial nerves: Facial symmetry is present. Speech is normal, no aphasia or dysarthria is noted. Extraocular movements are full. Visual fields are full.  Pupils are equal, round, and reactive to light.  Discs do not appear to be markedly edematous, no hemorrhages are seen.  Motor: The patient has good strength in all 4 extremities.  Sensory examination: Soft touch sensation is symmetric on the face, arms, and legs.  Coordination: The patient has good finger-nose-finger and heel-to-shin bilaterally.  Gait and station: The patient has a normal gait. Tandem gait is normal. Romberg is negative. No drift is seen.  Reflexes: Deep tendon reflexes are symmetric.   Assessment/Plan:  1.  Obesity  2.  Pseudotumor cerebri  3.  Papilledema  The patient will go back on Topamax which seem be effective in controlling the papilledema.  He will  continue be followed through his ophthalmologist.  He will follow-up here in 6 months.  Jill Alexanders MD 07/11/2018 8:19 AM  [facility] [address] [place], [place] [address]  Phone [phone] Fax [phone]

## 2018-07-11 NOTE — Telephone Encounter (Signed)
I received a report of an ophthalmology evaluation done on 04 July 2018.  The evaluation shows mild worsening papilledema.  The patient currently is off of Topamax.

## 2018-08-07 ENCOUNTER — Other Ambulatory Visit: Payer: Self-pay

## 2018-08-07 DIAGNOSIS — Z20822 Contact with and (suspected) exposure to covid-19: Secondary | ICD-10-CM

## 2018-08-10 LAB — NOVEL CORONAVIRUS, NAA: SARS-CoV-2, NAA: NOT DETECTED

## 2018-08-18 MED FILL — LEVOTHYROXINE 75 MCG TABLET: 75 | 90 days supply | Qty: 90 | Fill #0

## 2018-10-06 ENCOUNTER — Ambulatory Visit: Payer: 59 | Admitting: Family Medicine

## 2018-10-09 ENCOUNTER — Ambulatory Visit: Payer: 59 | Admitting: Family Medicine

## 2018-10-13 DIAGNOSIS — G932 Benign intracranial hypertension: Secondary | ICD-10-CM | POA: Diagnosis not present

## 2018-10-16 ENCOUNTER — Telehealth: Payer: Self-pay | Admitting: Neurology

## 2018-10-16 NOTE — Telephone Encounter (Signed)
Pt wife is asking for a call from RN to discuss pt passing out

## 2018-10-16 NOTE — Telephone Encounter (Signed)
I reached out to the pt's wife ( ok per DPR).  She states last week the pt had two episodes of passing out and 1 the week prior.  She reports the pt was sitting on the bed laughing and then would fall back and would have what appeared to be a seizure She confirmed the pt has been taking the topamax 50 mg bid and has not missed a dosage. She reports had an appt with Dr. Alvester Chou and [facility] opthalmology and report showed no papillary edema change from exam in June. I have requested the the report from 10/13/2018 to be sent to our office.  I was able to schedule the pt for a f/u with Dr. Jannifer Franklin on 10/25/18 at 12 pm and advised his wife I would send a message to Dr. Jannifer Franklin for him to review on 10/17/2018 once he returns to the office incase any new recommendations should be made between now and the 10/7 appt. She was agreeable.

## 2018-10-17 NOTE — Telephone Encounter (Signed)
I will see him on the revisit, we may have to consider a repeat lumbar puncture.  He has had significantly elevated opening pressures previously.

## 2018-10-19 MED FILL — TOPIRAMATE 50 MG TABLET: 50 | 90 days supply | Qty: 180 | Fill #1

## 2018-10-23 ENCOUNTER — Telehealth: Payer: Self-pay | Admitting: Neurology

## 2018-10-23 NOTE — Telephone Encounter (Signed)
The patient was seen through ophthalmology on 13 October 2018.  This shows a stable examination no significant papilledema was seen.

## 2018-10-26 ENCOUNTER — Other Ambulatory Visit: Payer: Self-pay

## 2018-10-26 ENCOUNTER — Ambulatory Visit: Payer: 59 | Admitting: Neurology

## 2018-10-26 ENCOUNTER — Encounter: Payer: Self-pay | Admitting: Neurology

## 2018-10-26 VITALS — BP 120/72 | HR 60 | Ht 71.0 in | Wt 323.0 lb

## 2018-10-26 DIAGNOSIS — R55 Syncope and collapse: Secondary | ICD-10-CM | POA: Diagnosis not present

## 2018-10-26 DIAGNOSIS — G932 Benign intracranial hypertension: Secondary | ICD-10-CM

## 2018-10-26 MED ORDER — TOPIRAMATE 50 MG PO TABS: ORAL_TABLET | ORAL | 1 refills | Status: DC

## 2018-10-26 NOTE — Progress Notes (Signed)
Reason for visit: Pseudotumor cerebri, recurring syncope  Carl Knox is an 53 y.o. male  History of present illness:  Carl Knox is a 53 year old left-handed black male with a history of obesity, sleep apnea, and pseudotumor cerebri.  The patient has started having episodes of syncope that are associated with bouts of laughing.  The first such event occurred on 25 September 2018, the last event occurred on 11 October 2018.  The episodes are associated with a brief syncopal event without jerking or twitching, the patient is somewhat dazed afterwards.  He may have a slight headache following the event.  The patient has been seen by his ophthalmologist, no significant change in the eye findings were noted.  The patient is on Topamax taking 50 mg twice daily.  He reports no double vision or loss of vision, he may have sparkles in the vision around the time of the episodes of syncope.  He otherwise does not have blackouts when straining to have a bowel movement or while stooping or lifting something heavy.  He reports no focal symptoms of numbness or weakness of the extremities, no change in balance.  He returns to the office today for an evaluation.  Past Medical History:  Diagnosis Date  . Morbid obesity ([place])   . OSA on CPAP   . Pseudotumor cerebri   . Syncope and collapse 02/06/2016    Past Surgical History:  Procedure Laterality Date  . CYST EXCISION  2001   Thyglossal duct  . TONSILLECTOMY  1980    Family History  Problem Relation Age of Onset  . Hypertension Mother   . Heart disease Mother   . Kidney disease Mother   . Diabetes Father   . Hypertension Father     Social history:  reports that he has never smoked. He has never used smokeless tobacco. He reports current alcohol use. He reports that he does not use drugs.    Allergies  Allergen Reactions  . Valtrex [Valacyclovir Hcl]     Medications:  Prior to Admission medications   Medication Sig Start Date End Date  Taking? Authorizing Provider  levothyroxine (SYNTHROID, LEVOTHROID) 75 MCG tablet Take 75 mcg by mouth daily. 10/12/16  Yes [provider]  Multiple Vitamin (MULTIVITAMIN) tablet Take 1 tablet by mouth daily.   Yes [provider]  topiramate (TOPAMAX) 50 MG tablet Take 1 tablet (50 mg total) by mouth 2 (two) times daily. 07/11/18  Yes Kathrynn Ducking, MD    ROS:  Out of a complete 14 system review of symptoms, the patient complains only of the following symptoms, and all other reviewed systems are negative.  Syncope Headache  Blood pressure 120/72, pulse 60, height 5\' 11"  (1.803 m), weight (!) 323 lb (146.5 kg).  Physical Exam  General: The patient is alert and cooperative at the time of the examination.  The patient is markedly obese.  Respiratory: Lung fields are clear.  Cardiovascular: Regular rate and rhythm, no murmurs or rubs are noted.  Neck: Neck is supple, no carotid bruits are noted.  Eyes: Pupils are equal, round, and reactive to light.  Discs appear to be relatively flat bilaterally.  No hemorrhages are seen.  Skin: No significant peripheral edema is noted.   Neurologic Exam  Mental status: The patient is alert and oriented x 3 at the time of the examination. The patient has apparent normal recent and remote memory, with an apparently normal attention span and concentration ability.   Cranial nerves:  Facial symmetry is present. Speech is normal, no aphasia or dysarthria is noted. Extraocular movements are full. Visual fields are full.  Motor: The patient has good strength in all 4 extremities.  Sensory examination: Soft touch sensation is symmetric on the face, arms, and legs.  Coordination: The patient has good finger-nose-finger and heel-to-shin bilaterally.  Gait and station: The patient has a normal gait. Tandem gait is normal. Romberg is negative. No drift is seen.  Reflexes: Deep tendon reflexes are symmetric.   Assessment/Plan:   1.  History of pseudotumor cerebri  2.  Recurrent syncopal events  3.  Obesity  4.  Sleep apnea  The patient has a history of pseudotumor cerebri, the episodes of syncope are all associated with laughing events, will need to exclude the possibility of extremely high intracranial pressure.  Lumbar puncture will be done for this reason.  EEG will be done, if these studies are unrevealing, we will consider a 30-day cardiac monitor study and MRI of the brain.  The patient does not report excessive daytime drowsiness, no family history of narcolepsy is noted.  He will follow-up for his next scheduled appointment.  Jill Alexanders MD 10/26/2018 1:17 PM  [facility] [address] [place], [place] [address]  Phone [phone] Fax [phone]

## 2018-10-30 ENCOUNTER — Other Ambulatory Visit: Payer: Self-pay

## 2018-10-30 ENCOUNTER — Telehealth: Payer: Self-pay | Admitting: Neurology

## 2018-10-30 ENCOUNTER — Ambulatory Visit
Admission: RE | Admit: 2018-10-30 | Discharge: 2018-10-30 | Disposition: A | Discharge status: Home / Self Care | Payer: 59 | Source: Ambulatory Visit | Attending: Neurology | Admitting: Neurology

## 2018-10-30 VITALS — BP 106/65 | HR 55

## 2018-10-30 DIAGNOSIS — G932 Benign intracranial hypertension: Secondary | ICD-10-CM

## 2018-10-30 DIAGNOSIS — R55 Syncope and collapse: Secondary | ICD-10-CM

## 2018-10-30 LAB — CSF CELL COUNT WITH DIFFERENTIAL
RBC Count, CSF: 0 cells/uL (ref 0–10)
WBC, CSF: 0 cells/uL (ref 0–5)

## 2018-10-30 LAB — GLUCOSE, CSF: Glucose, CSF: 62 mg/dL (ref 40–80)

## 2018-10-30 LAB — PROTEIN, CSF: Total Protein, CSF: 49 mg/dL — ABNORMAL HIGH (ref 15–45)

## 2018-10-30 NOTE — Telephone Encounter (Signed)
I called patient, talk with wife.  The spinal tap showed opening pressure of 26, this is still not likely explanation for his blackouts.  We will check a 30-day cardiac monitor, check MRI of the brain and MRA of the head.  The etiology of blackouts is not yet determined.

## 2018-10-30 NOTE — Discharge Instructions (Signed)

## 2018-10-31 ENCOUNTER — Telehealth: Payer: Self-pay | Admitting: Neurology

## 2018-10-31 NOTE — Telephone Encounter (Signed)
[facility] umr order sent to GI. No auth they will reach out to the patient to schedule.  

## 2018-11-07 ENCOUNTER — Telehealth: Payer: Self-pay | Admitting: *Deleted

## 2018-11-07 NOTE — Telephone Encounter (Signed)
[employer] to ship a 30 day cardiac event monitor to the patients home.  Instructions reviewed briefly as they are included in the monitor kit. 

## 2018-11-08 ENCOUNTER — Telehealth: Payer: Self-pay | Admitting: Neurology

## 2018-11-08 ENCOUNTER — Encounter: Payer: Self-pay | Admitting: Neurology

## 2018-11-08 NOTE — Telephone Encounter (Signed)
Pt wife(on DPR) has called with questions re: pt working while wearing the cardiac monitoring devise.  Please call

## 2018-11-08 NOTE — Telephone Encounter (Signed)
I called and talk with the wife.  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  The cardiac monitor will require this for 30 days.  I will write a note indicating this.  The wife will come by and pick up the note.

## 2018-11-08 NOTE — Telephone Encounter (Signed)
This patient was seen by [facility] ophthalmology, he was seen on 23 October 2018.  His exam was stable, no significant papilledema was seen, minimal to mild edema was seen bilaterally.

## 2018-11-08 NOTE — Telephone Encounter (Addendum)
I reached out to the pt's wife ( ok per dpr) she wanted to discuss with MD about getting a work note for the pt while he wears his 30 day heart monitor. Pt works in a [employer] and the use of [employer] are prohibited. Pt was advised by the cardiac monitor rep a cell phone would have to be in at least 10 feet at all time.  Pt should receive his monitor mid next week.

## 2018-11-09 DIAGNOSIS — Z9889 Other specified postprocedural states: Secondary | ICD-10-CM | POA: Diagnosis not present

## 2018-11-09 DIAGNOSIS — E236 Other disorders of pituitary gland: Secondary | ICD-10-CM | POA: Diagnosis not present

## 2018-11-09 DIAGNOSIS — E039 Hypothyroidism, unspecified: Secondary | ICD-10-CM | POA: Diagnosis not present

## 2018-11-09 DIAGNOSIS — E669 Obesity, unspecified: Secondary | ICD-10-CM | POA: Diagnosis not present

## 2018-11-09 NOTE — Telephone Encounter (Signed)
Letter placed upfront at [facility] for pick up.

## 2018-11-09 NOTE — Telephone Encounter (Signed)
Pt's wife called and was informed the letter was upfront. She will be coming in before 5 to pick up.

## 2018-11-13 DIAGNOSIS — G932 Benign intracranial hypertension: Secondary | ICD-10-CM | POA: Diagnosis not present

## 2018-11-14 NOTE — Telephone Encounter (Signed)
Pts wife called in and stated they needed an update to the letter before its given to employer, ( he is currently being evaluated for episodes of syncope), episodes of syncope needs to be removed.

## 2018-11-14 NOTE — Telephone Encounter (Signed)
I will rewrite the letter.

## 2018-11-14 NOTE — Telephone Encounter (Signed)
Letter may be faxed  [phone]

## 2018-11-14 NOTE — Telephone Encounter (Signed)
I attempted to speak with the pt's wife. She advised she was receiving another call and would call back.

## 2018-11-14 NOTE — Telephone Encounter (Signed)
Pt's wife called and requesting the part of the letter that states " episodes of syncope" be removed. She states the pt would like to keep his health condition confidential at this time.

## 2018-11-15 ENCOUNTER — Other Ambulatory Visit: Payer: Self-pay

## 2018-11-15 ENCOUNTER — Ambulatory Visit: Payer: 59 | Admitting: Neurology

## 2018-11-15 ENCOUNTER — Telehealth: Payer: Self-pay | Admitting: Neurology

## 2018-11-15 DIAGNOSIS — R55 Syncope and collapse: Secondary | ICD-10-CM

## 2018-11-15 NOTE — Procedures (Signed)
    History:  Carl Knox is a 53 year old patient with a history of episodes of syncope or near syncope associated with laughing.  The episodes are associated with falling over, he may not completely blackout, he does not have any jerking or twitching.  He may be somewhat dazed with the events.  He is being evaluated for possible seizures.  This is a routine EEG.  No skull defects are noted.  Medications include Synthroid, multivitamins, Topamax.  EEG classification: Normal awake and asleep  Description of the recording: The background rhythms of this recording consists of a fairly well modulated medium amplitude background activity of 10 Hz. As the record progresses, the patient initially is in the waking state, but appears to enter the early stage II sleep during the recording, with rudimentary sleep spindles and vertex sharp wave activity seen. During the wakeful state, photic stimulation is performed, and this results in a bilateral and symmetric photic driving response. Hyperventilation was also performed, and this results in a minimal buildup of the background rhythm activities without significant slowing seen. At no time during the recording does there appear to be evidence of spike or spike wave discharges or evidence of focal slowing. EKG monitor shows no evidence of cardiac rhythm abnormalities with a heart rate of 48.  Impression: This is a normal EEG recording in the waking and sleeping state. No evidence of ictal or interictal discharges were seen at any time during the recording.

## 2018-11-15 NOTE — Telephone Encounter (Signed)
I called the patient.  The EEG study was normal in the waking and sleeping state, the heart rate was 48 during the study.  The consideration for cataplexy is considered, the patient does have sleep apnea and he does have a lot of daytime drowsiness.  However, the patient does not think that he actually blacks out with some of the events.  The patient will be getting a heart monitor, this is very important to look to see if he has severe bradycardia throughout the study.

## 2018-11-19 ENCOUNTER — Ambulatory Visit (INDEPENDENT_AMBULATORY_CARE_PROVIDER_SITE_OTHER): Payer: 59

## 2018-11-19 DIAGNOSIS — R55 Syncope and collapse: Secondary | ICD-10-CM | POA: Diagnosis not present

## 2018-11-22 ENCOUNTER — Ambulatory Visit
Admission: RE | Admit: 2018-11-22 | Discharge: 2018-11-22 | Disposition: A | Discharge status: Home / Self Care | Payer: 59 | Source: Ambulatory Visit | Attending: Neurology | Admitting: Neurology

## 2018-11-22 ENCOUNTER — Other Ambulatory Visit: Payer: Self-pay

## 2018-11-22 ENCOUNTER — Telehealth: Payer: Self-pay | Admitting: Neurology

## 2018-11-22 DIAGNOSIS — R55 Syncope and collapse: Secondary | ICD-10-CM

## 2018-11-22 NOTE — Telephone Encounter (Signed)
I contacted the pt's wife. She states the pt is schedule to appear at the court house in [place] tomorrow in reference to his father's estate.  She reports she needs a letter stating the pt is wearing a cardiac monitor and needs he monitor/cell phone on his person at all times. I have typed letter and pt can pick letter up tomorrow. Pt's wife verbalized understanding.  She reports the pt has been more fatigued and walking has slowed since last visit. Pt is scheduled to have MRI tonight. I advised we would wait to see what MRI showed and then further recommendations could be made she was agreeable and will let us know if fatigue/gait changes.

## 2018-11-22 NOTE — Telephone Encounter (Signed)
Pts wife Carlyon Shadow called in and stated everything is going well with his heart monitor, but they are leaving to handle his fathers estate and they are going to need to go to the courthouse and they would need a note stating he needs to be hand wanded and needs to be able to take cell phone in to monitor heart

## 2018-11-25 ENCOUNTER — Telehealth: Payer: Self-pay | Admitting: Neurology

## 2018-11-25 NOTE — Telephone Encounter (Signed)
I called the patient, talk with the wife.  MRI of the brain is relatively unremarkable, mild ventriculomegaly.  MRA of the head is normal.  The patient is on a heart monitor.  He did have an episode associated with laughing, he marked it on the recorder.  The EEG study was normal but showed a low heart rate.   MRI brain 11/24/18:  IMPRESSION:   MRI brain (without) demonstrating: - Stable, mild ventriculomegaly. - No acute findings.   MRA head 11/24/18:   IMPRESSION:   Normal MRA head (without).

## 2018-11-27 MED FILL — LEVOTHYROXINE 75 MCG TABLET: 75 | 90 days supply | Qty: 90 | Fill #0

## 2018-12-05 ENCOUNTER — Telehealth: Payer: Self-pay | Admitting: Neurology

## 2018-12-05 NOTE — Telephone Encounter (Signed)
Pt wife is asking for a call from RN to discuss how pt is doing on the topiramate (TOPAMAX) 50 MG tablet. Pt wife is also asking for a call to discuss how pt is doing on the cardiac monitor

## 2018-12-06 NOTE — Telephone Encounter (Signed)
I reached out to the pt wife( ok per dpr). She wanted to check in and report the pt is doing well with heart monitor but stills feels drowsy and increased fatigue. I advised the pt's wife this is a common side effect but we could have MD further review if changes need to be made once he returns to the office on 12/11/2018.  She reports the pt had a second opthalmology eval on 11/13/2018 and report was stable but edema was the same with no improvement/ no worsening.  Wife wanted to know with the medication  (topamax) should an improvement even been seen? I requested the wife to call Dr. Weyman Rodney office and request report so Dr. Jannifer Franklin could review and further advise on.

## 2018-12-07 ENCOUNTER — Ambulatory Visit: Payer: 59 | Admitting: Family Medicine

## 2018-12-11 ENCOUNTER — Telehealth: Payer: Self-pay | Admitting: Neurology

## 2018-12-11 NOTE — Telephone Encounter (Signed)
This patient was seen by Dr. Jola Schmidt from ophthalmology on November 13, 2018.  The examination was difficult as the patient would squeeze his eyelids tightly, no comment was made regarding the optic disc, he will be followed up in 3 months.

## 2018-12-18 DIAGNOSIS — K5900 Constipation, unspecified: Secondary | ICD-10-CM | POA: Diagnosis not present

## 2018-12-18 DIAGNOSIS — Z1211 Encounter for screening for malignant neoplasm of colon: Secondary | ICD-10-CM | POA: Diagnosis not present

## 2018-12-20 ENCOUNTER — Telehealth: Payer: Self-pay | Admitting: Neurology

## 2018-12-20 ENCOUNTER — Telehealth: Payer: Self-pay | Admitting: *Deleted

## 2018-12-20 DIAGNOSIS — M545 Low back pain: Secondary | ICD-10-CM | POA: Diagnosis not present

## 2018-12-20 NOTE — Telephone Encounter (Addendum)
I called pts wife Carlyon Shadow on dpr about Dr Leonie Man advice on topamax with complaints of lower back pain and constipation. I stated Dr.SEthi does not think the topamax can cause constipation or severe lower pain and to contact PCP for further evaluation. The wife will taking pt  to the PCP walk in clinic tomorrow about the new issue of lower back pain and constipation.SHe appreciate the call and verbalized understanding.

## 2018-12-20 NOTE — Telephone Encounter (Signed)
Follow Up:    Wife said she talked to you earlier about pt Monitor. She would like for you to call her, she says she have some more questions please.

## 2018-12-20 NOTE — Telephone Encounter (Signed)
Patient wife called this morning to inform patient has been feeling severe lower back pain on and off for about a month and believed it was related to the topamax and constipation however, since Saturday it has been a constant pain. Patient wife would like to know what the next step should be. Please follow up.

## 2018-12-20 NOTE — Telephone Encounter (Signed)
Revised. 

## 2018-12-20 NOTE — Telephone Encounter (Signed)
Patient in Urgent Care today for progressive back pain since Saturday.  Mrs. Boilard said they turned monitor in and there was a time when patient was dizzy.  There was another episode where patient felt he was going to pass out and he accidentally labelled it as a flutter.  Mrs. Tennyson wanted to know when Dr. Tamala Julian would have results to review.  EOS date was 12/19/2018.  I sent a message to customer support at [employer] to ask for EOS report to be expedited.  I will import results 12/21/18 AM for Dr.Smith to review if available.  Express view of patient episodes were NSR with HR in normal range.

## 2018-12-20 NOTE — Telephone Encounter (Signed)
I do not believe Topamax can cause this. Ask patient to see PCP for advice

## 2018-12-21 ENCOUNTER — Other Ambulatory Visit (HOSPITAL_COMMUNITY): Payer: Self-pay | Admitting: Neurology

## 2018-12-21 DIAGNOSIS — M5440 Lumbago with sciatica, unspecified side: Secondary | ICD-10-CM

## 2018-12-21 NOTE — Telephone Encounter (Signed)
Pt's wife Carlyon Shadow on [place] called and wanted to discuss with RN about the possibility that the lumbar puncture that the pt had on Oct 12th may have to do with the pt's lower back pain. She states that the pain actually started then but because the procedure had just happened and the pt was given medication they thought nothing of it until recently. Please advise.

## 2018-12-21 NOTE — Telephone Encounter (Signed)
I spoke to the patient's wife who informed me that the patient has been having low back pain ever since his spinal tap 2 months ago and has gotten worse recently and he can barely walk.  He has been taking muscle relaxants and resting.  He was seen in urgent care yesterday.  He denies any weakness in his legs or numbness.  I recommend checking an urgent MRI of the low back to make sure there is no compressing lesion.  She was advised to take the patient to the emergency room if there was any weakness or numbness in his legs.  She voiced understanding

## 2018-12-21 NOTE — Telephone Encounter (Signed)
I called pts wife that the MRI coordinator who authorizes scans have left for today. I stated we can do it on Monday. The wife stated pt will be seeing his PCP tomorrow. I stated if the PCP can do some conservative measures until he gets scan schedule. I stated Dr Leonie Man stated if pt gets worse over the next three days to seek only the hospital for his condition not the urgent care. The wife verbalized understanding.

## 2018-12-22 ENCOUNTER — Telehealth: Payer: Self-pay | Admitting: Neurology

## 2018-12-22 DIAGNOSIS — G932 Benign intracranial hypertension: Secondary | ICD-10-CM | POA: Diagnosis not present

## 2018-12-22 DIAGNOSIS — M47816 Spondylosis without myelopathy or radiculopathy, lumbar region: Secondary | ICD-10-CM | POA: Diagnosis not present

## 2018-12-22 DIAGNOSIS — E236 Other disorders of pituitary gland: Secondary | ICD-10-CM | POA: Diagnosis not present

## 2018-12-22 DIAGNOSIS — M62830 Muscle spasm of back: Secondary | ICD-10-CM | POA: Diagnosis not present

## 2018-12-22 MED FILL — tiZANidine HCL 4 MG TABS: 4 | 10 days supply | Qty: 30 | Fill #0

## 2018-12-22 MED FILL — TOPIRAMATE 50 MG TABLET: 50 | 90 days supply | Qty: 270 | Fill #0

## 2018-12-22 NOTE — Telephone Encounter (Signed)
I would be happy to evaluate him in sleep clinic and consider PSG/MSLT.

## 2018-12-22 NOTE — Telephone Encounter (Signed)
I called the patient.  I talk with the wife.  The cardiac monitor study is normal.  The patient continues to have episodes of blacking out or collapse with laughing.  I will send a note to Dr. Rexene Alberts to see if a multiple sleep latency test may be indicated to exclude cataplexy associated with narcolepsy.  All events seem to occur with laughing.  His opening pressure on his lumbar puncture is 26, I do not think this is the etiology of his blackouts.  The patient has developed some low back pain that started after the lumbar puncture but has significantly worsened over the last several days, MRI of the lumbar spine has been done to exclude an epidural hematoma, this has been ordered on an urgent basis.  No history of hematuria that would suggest kidney stones on Topamax.  The patient is having some issues with tolerating the Topamax, he is having increased daytime drowsiness on the drug, could switch to Trokendi if they desire.    Cardiac monitor study 12/21/18:  Narrative & Impression      Normal sinus rhythm  No arrhythmia.  No correlation between symptoms and rhythm   Normal study.

## 2018-12-25 ENCOUNTER — Other Ambulatory Visit: Payer: Self-pay

## 2018-12-25 DIAGNOSIS — M5440 Lumbago with sciatica, unspecified side: Secondary | ICD-10-CM

## 2018-12-25 NOTE — Telephone Encounter (Signed)
Pt is scheduled on mobile unit 12/26/18.

## 2018-12-25 NOTE — Progress Notes (Signed)
Order put in stat for scan.

## 2018-12-25 NOTE — Telephone Encounter (Signed)
Message sent to staff at [facility] who authorizes and schedules MRI. They are aware of stat image.

## 2018-12-26 ENCOUNTER — Ambulatory Visit: Payer: 59

## 2018-12-26 ENCOUNTER — Other Ambulatory Visit: Payer: Self-pay

## 2018-12-26 DIAGNOSIS — M5440 Lumbago with sciatica, unspecified side: Secondary | ICD-10-CM | POA: Diagnosis not present

## 2018-12-26 MED ORDER — GADOBENATE DIMEGLUMINE 529 MG/ML IV SOLN
20.0000 mL | Freq: Once | INTRAVENOUS | Status: AC | PRN
  Administered 2018-12-26: 20 mL via INTRAVENOUS

## 2018-12-28 ENCOUNTER — Telehealth: Payer: Self-pay | Admitting: Neurology

## 2018-12-28 MED ORDER — METHOCARBAMOL 500 MG PO TABS: 500.0000 mg | ORAL_TABLET | Freq: Three times a day (TID) | ORAL | 0 refills | Status: DC

## 2018-12-28 MED ORDER — PREDNISONE 5 MG PO TABS: ORAL_TABLET | ORAL | 0 refills | Status: DC

## 2018-12-28 MED FILL — METHOCARBAMOL 500 MG TABS: 500 | 10 days supply | Qty: 30 | Fill #0

## 2018-12-28 MED FILL — predniSONE 5 MG TABS: 5 | 6 days supply | Qty: 21 | Fill #0

## 2018-12-28 MED FILL — PEG-3350 SOLUTION: 420 | 1 days supply | Qty: 4000 | Fill #0

## 2018-12-28 NOTE — Telephone Encounter (Signed)
I called the patient.  MRI of the low back was unremarkable.  He is still having some achy pain in the back without radiation down the legs.  I will send in a short course of prednisone and a muscle relaxant, if he is not getting better, he will call our office and we will set him up for physical therapy.   MRI lumbar 12/26/18:  IMPRESSION:   MRI lumbar spine (with and without) demonstrating: - At L1-2: mild disc bulging with no spinal stenosis or foraminal narrowing.

## 2018-12-28 NOTE — Telephone Encounter (Signed)
Pt wife(on DPR-Hagins,Darlene H)has called re: awaiting results to urgently ordered MRI, please call when results are available.

## 2018-12-28 NOTE — Addendum Note (Signed)
Addended by: Kathrynn Ducking on: 12/28/2018 05:06 PM   Modules accepted: Orders

## 2019-01-01 ENCOUNTER — Telehealth: Payer: Self-pay

## 2019-01-01 NOTE — Progress Notes (Signed)
See telephone note from 01/01/2019.

## 2019-01-01 NOTE — Telephone Encounter (Signed)
Carl Fila, MD  01/01/2019 3:16 PM EST    Kindly inform patient that MRI of back shows mild disc bulge at L 1-2 but no significant nerve or canal narrowing   I reached out to to pt's wife and advised of this. She verbalized understanding. Dr. Jannifer Franklin called the pt and and advised of results on 12/28/2018. On this date Dr. Jannifer Franklin prescribed Methocarbamol and prednisone. Pt's wife confirmed the pt has started the prednisone but has not started the methocarbamol yet due to pcp prescribing Tizanidine around the same time frame. She reports the pt is waiting to hear back from his PCP about which one would be best to take since he has KD stage 3. Wife reports she will update our office once she hears back on nephrologist recommendation.  I was also able to discuss PSG/MSLT approved by Dr. Rexene Alberts on 12/28/2018.

## 2019-01-03 ENCOUNTER — Telehealth: Payer: Self-pay | Admitting: Neurology

## 2019-01-03 NOTE — Telephone Encounter (Signed)
I called pt's wife back. She reports she heard back from pt's nephrologist today and was advised it was safe for the pt to take the prednisone and 1 of the muscle relaxer's.  Pt's wife sts she is going to speak with the pt's pharmacist on which on ( Robaxin or Zanaflex) would provide the most benefit then he we would start one or the other.   She sts the pt's back pain has still be present. I advised to try the muscle relaxer and update [place] on how he is doing. She verbalized understanding.

## 2019-01-03 NOTE — Telephone Encounter (Signed)
Pt's wife Carlyon Shadow on [place] called wanting to discuss the pt's medications and also to discuss pts pain that he is still feeling. Please advise.

## 2019-01-17 NOTE — Telephone Encounter (Signed)
Patients wife Carlyon Shadow is calling in stating he is doing ok on the muscle relaxer , he hasnt had to work while on it so far due to being out of work. She also is wanting to know if there is anyway his appt can be changed from 01/22/2019 due to his job . He is currently off on 02/05/2019 she wants to know if he can be worked in on that day. Please advise.

## 2019-01-17 NOTE — Telephone Encounter (Signed)
I returned the call to the patient's wife.  She needs to r/s his 02/05/2019 appt.  He has been moved to 02/23/2019.  He returns to work bn Monday.  He will call back, if issues arise from his increase physical activity.

## 2019-01-18 MED FILL — FLUTICASONE PROP 50 MCG SPR: 50 | 30 days supply | Qty: 16 | Fill #1

## 2019-01-22 ENCOUNTER — Ambulatory Visit: Payer: 59 | Admitting: Neurology

## 2019-01-25 ENCOUNTER — Telehealth: Payer: Self-pay

## 2019-01-25 NOTE — Telephone Encounter (Signed)
Patients  wife darlene states patient has been taking the robaxin and it seems to be helping. States while patient was laughing he experienced dizziness and passing out. States patients has been having cloudy urine and is not sure if it is related to his other symptoms. Patient would like to try the long-term topamax and would like to know if it could be called in for him.states patient has begun working out at home and it seems to be helping with the back pain  Please follow up

## 2019-01-25 NOTE — Telephone Encounter (Signed)
Spoke with wife who stated the dizziness is what Dr Jannifer Franklin is seeing patient for, and she was just making Dr Jannifer Franklin aware. I advised her re: cloudy urine is not normal. Advised she contact his PCP because he may need to give specimen to be checked for UTI. Re: Trokendi Rx, I informed her Dr Jannifer Franklin is out of the office returns next Monday. I gave her Trokendi web site to check on savings card. I advised wife I'll let Dr Jannifer Franklin know next week of request for trokendi. She  verbalized understanding, appreciation.

## 2019-01-29 MED ORDER — TROKENDI XR 50 MG PO CP24: 150.0000 mg | ORAL_CAPSULE | Freq: Every day | ORAL | 3 refills | Status: DC

## 2019-01-29 MED FILL — TROKENDI XR 50 MG CAPSULE: 50 | 30 days supply | Qty: 90 | Fill #0

## 2019-01-29 NOTE — Telephone Encounter (Signed)
I called and talk with the wife.  The wife has a pulse ox machine, she has noted at night his heart rates are between 42 and 48, the rate was 48 on EEG study.  Cardiac monitor study however did not show any cardiac rhythm issues and no correlation to symptoms.  The patient has had 1 recent syncopal event associated with laughter.  He will be going for a sleep evaluation to exclude narcolepsy with cataplexy in the near future.  We will switch on over to the Trokendi as he is having drowsiness on the Topamax, hopefully ameliorate some of the cognitive side effects on the Topamax.  He will take 150 mg of Trokendi in the evening.

## 2019-01-29 NOTE — Addendum Note (Signed)
Addended by: Kathrynn Ducking on: 01/29/2019 03:04 PM   Modules accepted: Orders

## 2019-02-05 DIAGNOSIS — Z1159 Encounter for screening for other viral diseases: Secondary | ICD-10-CM | POA: Diagnosis not present

## 2019-02-07 ENCOUNTER — Encounter: Payer: Self-pay | Admitting: Neurology

## 2019-02-07 ENCOUNTER — Other Ambulatory Visit: Payer: Self-pay

## 2019-02-07 ENCOUNTER — Ambulatory Visit (INDEPENDENT_AMBULATORY_CARE_PROVIDER_SITE_OTHER): Payer: 59 | Admitting: Neurology

## 2019-02-07 ENCOUNTER — Telehealth: Payer: Self-pay

## 2019-02-07 VITALS — BP 126/75 | HR 66 | Temp 97.5°F | Ht 71.0 in | Wt 319.0 lb

## 2019-02-07 DIAGNOSIS — G932 Benign intracranial hypertension: Secondary | ICD-10-CM

## 2019-02-07 DIAGNOSIS — G4733 Obstructive sleep apnea (adult) (pediatric): Secondary | ICD-10-CM | POA: Diagnosis not present

## 2019-02-07 DIAGNOSIS — Z9989 Dependence on other enabling machines and devices: Secondary | ICD-10-CM | POA: Diagnosis not present

## 2019-02-07 DIAGNOSIS — R55 Syncope and collapse: Secondary | ICD-10-CM

## 2019-02-07 NOTE — Progress Notes (Signed)
Subjective:    Patient ID: Ihan Pat is a 54 y.o. male.  HPI     Interim history:   Mr. Authement is a 54 year old right-handed gentleman with an underlying medical history of pseudotumor cerebri, syncope, chronic low back pain, hypothyroidism and morbid obesity, who presents for follow-up consultation of his obstructive sleep apnea, on CPAP therapy. The patient is accompanied by his wife today. I last saw him on 10/25/2017, at which time he was not compliant with his CPAP. He reported sinus issues including congestion.  He saw Cecille Rubin, nurse practitioner in the interim on 04/05/2018, at which time he was not compliant with his CPAP. He reported having an erratic sleep schedule.   Today, 02/07/2019: He did not bring his CPAP machine, he reports that he had not been using his CPAP since June of last year for fear of spreading the Covid infection.  However, he has not had any symptoms or has been diagnosed with Covid infection. He reports that there have been several cases at his workplace. Sadly, his father passed away from COVID-36. He has had episodes of zoning out, last syncopal event with loss of consciousness and collapse was about a year ago per wife.  His history is primarily provided by his wife.  When he has an episode of feeling dazed after laughing, at appears to be more like a staring spell, he seems to have lethargy or feeling sluggish throughout the rest of the day afterwards.  She is wondering if he is having seizures.  He has been checked with an EEG.  He has had more problems with increasing papilledema, his Topamax has been increased, he had a lumbar puncture in October 2020.  He has  a follow-up appointment with his ophthalmologist pending next week. Dr. Jannifer Franklin was concerned that he may be having cataplectic symptoms. He has seen cardiology.  He had a 30-day heart monitor, he was noted to have bradycardia.     The patient's allergies, current medications, family history, past  medical history, past social history, past surgical history and problem list were reviewed and updated as appropriate.    Previously (copied from previous notes for reference):      I saw him 11/02/2016, at which time we talked about his split-night sleep study from 08/20/2016. He was adequate with his CPAP compliance at the time and advised to follow-up in 6 months. He did report feeling somewhat better with CPAP therapy. He was working on weight loss.    He saw Cecille Rubin in the interim on 05/12/2017, at which time he was not fully compliant with CPAP.    I reviewed his CPAP compliance data from 09/24/2017 through 10/23/2017 which is a total of 30 days, during which time he used his CPAP only 15 days with percent used days greater than 4 hours at 33%, indicating suboptimal to poor compliance with an average usage of 4 hours and 17 minutes for days on treatment. Residual AHI 0.1 per hour, leak high with the 95th percentile at 51 L/m on a pressure of 16 cm with EPR of 3. In the past 90 days his compliance has been similar.    I first met him on 08/02/2016 at the request of Cecille Rubin and Dr. Jannifer Franklin, at which time he reported a prior diagnosis of obstructive sleep apnea and intolerance to CPAP and BiPAP in the past. I asked him to return for a sleep study. He had a split-night sleep study on 08/20/2016. I went over his  test results with him in detail today. Baseline sleep latency was 22.5 minutes, REM latency was 89.5 minutes, sleep efficiency for the diagnostic portion of the study was 89.7%. He had absence of slow-wave sleep and REM sleep was 10%. Total AHI was 66.2 per hour, average oxygen saturation 95%, nadir was 78%, he had no significant PLMS. He was fitted with a medium full face mask. CPAP was titrated from 5 cm to 17 cm. On a pressure of 16 cm his AHI was 0 per hour with supine REM sleep achieved an O2 nadir of 91%. Based on his test results I prescribed CPAP therapy for home use.      I reviewed his CPAP compliance data from 10/01/2016 through 10/30/2016 which is a total of 30 days, during which time he used his CPAP 28 days with percent used days greater than 4 hours at 77%, indicating adequate compliance with an average usage of 5 hours and 11 minutes, residual AHI low at 0.2 per hour, leak high with the 95th percentile at 43.3 L/m on a pressure of 16 cm with EPR of 3.    08/02/2016: (He) was previously diagnosed with obstructive sleep apnea in 1999 and placed on CPAP therapy. Prior sleep study results are not available for my review today. He has not been using CPAP. He was also tried on BiPAP but is not using BiPAP therapy there. He could not tolerate it. A compliance download is not available for my review today. I reviewed your office note from 07/09/2016. His Epworth sleepiness score is 22 out of 24 today, fatigue score is 40 out of 63. His wife kept written records and states, his CPAP was started in 7/99 and then he was switched to BiPAP in 11/01, but did not tolerate either at the time, and had recurrent pneumonias. He has had worsening nasal congestion, had thyroglossal duct cyst surgery in Sept 2000, and FU with ENT in 2001. He had a TE as a child. He has nasal congestion, mouth breathing, also worsening. He Would be willing to get retested and try CPAP again. His mother has sleep apnea and uses a CPAP machine. He has dozed off at the wheel briefly, no issues with sleepiness at work are reported. He works in [employer], educational, typically from 7 AM to 3 PM, once or twice per week over time with 12 hour shifts, 7 AM to 7 PM. Bedtime is late, around midnight, wakeup time for work between 5 and 6 AM. He was originally diagnosed with pseudotumor cerebri in 2011. He has gained weight in the past few years. He is a nonsmoker, drinks alcohol very occasionally, maybe once a month or so, so dose of a daily basis, 2-3 cans per day typically. He has nocturia about once or twice per hour tonight,  denies morning headaches.      His Past Medical History Is Significant For: Past Medical History:  Diagnosis Date  . Morbid obesity ([place])   . OSA on CPAP   . Pseudotumor cerebri   . Syncope and collapse 02/06/2016    His Past Surgical History Is Significant For: Past Surgical History:  Procedure Laterality Date  . CYST EXCISION  2001   Thyglossal duct  . TONSILLECTOMY  1980    His Family History Is Significant For: Family History  Problem Relation Age of Onset  . Hypertension Mother   . Heart disease Mother   . Kidney disease Mother   . Diabetes Father   . Hypertension Father  His Social History Is Significant For: Social History   Socioeconomic History  . Marital status: Married    Spouse name: Not on file  . Number of children: 2  . Years of education: Some grad school  . Highest education level: Not on file  Occupational History  . Occupation: [employer]  Tobacco Use  . Smoking status: Never Smoker  . Smokeless tobacco: Never Used  Substance and Sexual Activity  . Alcohol use: Yes    Alcohol/week: 0.0 standard drinks    Comment: Occasional  . Drug use: No  . Sexual activity: Not on file    Comment: Married  Other Topics Concern  . Not on file  Social History Narrative   Lives at home w/ his wife   Left-handed   Caffeine: 2 cups daily   Social Determinants of Health   Financial Resource Strain:   . Difficulty of Paying Living Expenses: Not on file  Food Insecurity:   . Worried About Charity fundraiser in the Last Year: Not on file  . Ran Out of Food in the Last Year: Not on file  Transportation Needs:   . Lack of Transportation (Medical): Not on file  . Lack of Transportation (Non-Medical): Not on file  Physical Activity:   . Days of Exercise per Week: Not on file  . Minutes of Exercise per Session: Not on file  Stress:   . Feeling of Stress : Not on file  Social Connections:   . Frequency of Communication with Friends and Family: Not on file   . Frequency of Social Gatherings with Friends and Family: Not on file  . Attends Religious Services: Not on file  . Active Member of Clubs or Organizations: Not on file  . Attends Archivist Meetings: Not on file  . Marital Status: Not on file    His Allergies Are:  Allergies  Allergen Reactions  . Valtrex [Valacyclovir Hcl]   :   His Current Medications Are:  Outpatient Encounter Medications as of 02/07/2019  Medication Sig  . levothyroxine (SYNTHROID, LEVOTHROID) 75 MCG tablet Take 75 mcg by mouth daily.  . Multiple Vitamin (MULTIVITAMIN) tablet Take 1 tablet by mouth daily.  . Topiramate ER (TROKENDI XR) 50 MG CP24 Take 150 mg by mouth at bedtime.  . [DISCONTINUED] methocarbamol (ROBAXIN) 500 MG tablet Take 1 tablet (500 mg total) by mouth 3 (three) times daily. (Patient not taking: Reported on 02/07/2019)  . [DISCONTINUED] predniSONE (DELTASONE) 5 MG tablet Begin taking 6 tablets daily, taper by one tablet daily until off the medication.   No facility-administered encounter medications on file as of 02/07/2019.  :  Review of Systems:  Out of a complete 14 point review of systems, all are reviewed and negative with the exception of these symptoms as listed below:  Review of Systems  Neurological:       Here to discuss recent PSG/MSLT study. Pt stopped CPAP machine back in June due to worry about covid germs lingering in the machine.     Objective:  Neurological Exam  Physical Exam Physical Examination:   Vitals:   02/07/19 1043  BP: 126/75  Pulse: 66  Temp: (!) 97.5 F (36.4 C)    General Examination: The patient is a very pleasant 54 y.o. male in no acute distress. He appears well-developed and well-nourished and well groomed.   HEENT:Normocephalic, atraumatic, pupils are equal, round and reactive to light. Hearing is grossly intact, extraocular movements appear to be well preserved,  corrective eyeglasses in place.  Airway examination reveals moderate  mouth dryness, otherwise unchanged findings, tongue protrudes centrally in palate elevates symmetrically.   Chest:Clear to auscultation without wheezing, rhonchi or crackles noted.  Heart:S1+S2+0, regular and normal without murmurs, rubs or gallops noted.   Abdomen:Soft, non-tender and non-distended.  Extremities:There is noobvious edema.   Skin: Warm and dry without trophic changes noted.  Musculoskeletal: exam reveals no obvious joint deformities, tenderness or joint swelling or erythema.   Neurologically:  Mental status: The patient is awake, alert and oriented in all 4 spheres. Hisimmediate and remote memory, attention, language skills and fund of knowledge are appropriate. Speech is scant. Affect is blunted.  Cranial nerves II - XII are as described above under HEENT exam.  Motor exam: Normal bulk, strength and tone is noted. There is no tremor. Fine motor and coordination: grossly intact.  Romberg neg. Cerebellar testing: No dysmetria or intention tremor. There is no truncal or gait ataxia.  Sensory exam: intact to light touch in the upper and lower extremities.  Gait, station and balance: Hestands easily. No veering to one side is noted. No leaning to one side is noted. Posture is age-appropriate and stance is narrow based. Gait shows normalstride length and normalpace. No problems turning are noted.   Assessment and plan:  In summary, Waldo Damian a 54 year old male with an underlying medical history of pseudotumor cerebri, Bradycardia, recurrent syncope, hypothyroidism, OSA and morbid obesity, who presents for follow-up consultation of his severe obstructive sleep apnea And recent episode of passing out in the context of laughing. He appears to have either staring spells or loss of consciousness, description of the events in particular with lingering symptoms of feeling dazed for the rest of the day are not in keeping with cataplexy.  He has not been  using his CPAP for the past 7+ months.  He is strongly encouraged to get back on track with his CPAP therapy as treating his severe sleep apnea may also positively impact his pseudotumor and papilledema.  He is advised that there is no evidence to suggest that CPAP usage spreads the Covid infection in the absence of an actual infection or symptoms.  If he should get sick with any sinusitis, common cold, flu or Covid infection he should suspend using his CPAP temporarily.  He is advised to get all new supplies which I will prescribe through his DME company.  He is advised that should he have symptoms of sleepiness despite being compliant on CPAP and good apnea control down the road, we can certainly consider bringing him in for additional sleep testing in the form of a nocturnal polysomnogram with CPAP in place and next day MSLT. His history is certainly not suggestive of narcolepsy with or without cataplexy at this time. He is advised to be fully compliant with CPAP therapy and follow-up in about 3 months with the nurse practitioner. I answered all their questions today and the patient and his wife were in agreement. I spent 30 minutes in total face-to-face time and in reviewing records during pre-charting, more than 50% of which was spent in counseling and coordination of care, reviewing test results, reviewing medication and discussing or reviewing the diagnosis of OSA, the prognosis and treatment options. Pertinent laboratory and imaging test results that were available during this visit with the patient were reviewed by me and considered in my medical decision making (see chart for details).

## 2019-02-07 NOTE — Patient Instructions (Addendum)
Please get restarted on your CPAP machine.  In order for [place] to evaluate you for a sleepiness disorder such as narcolepsy, you would have to be fully compliant with your CPAP and your sleep apnea should be under good control and then we can consider bringing you in for additional sleep study testing in the form of a nighttime sleep study, followed by a daytime sleep study.  Your symptoms of passing out in the context of laughing, do not sound like cataplexy events. Please keep your follow-up appointments with your ophthalmologist as scheduled and follow-up with Dr. Jannifer Franklin as scheduled. Your spinal fluid pressure and pseudotumor symptoms may improve with treatment of your sleep apnea.

## 2019-02-07 NOTE — Telephone Encounter (Signed)
Patient wife called stating she has additional questions in regards to todays appointment.  Please follow up

## 2019-02-08 DIAGNOSIS — Z1211 Encounter for screening for malignant neoplasm of colon: Secondary | ICD-10-CM | POA: Diagnosis not present

## 2019-02-08 NOTE — Telephone Encounter (Signed)
I reached out to the pt's wife. She wanted to know if the pt could pursue narcolepsy/cataplexy testing at this time.  I reiterated the recommendation from Dr. Rexene Alberts that the pt would have to have documented compliance with the CPAP machine first and then in the future we could look at the possibility of narcolepsy/cataplexy testing.  Pt's wife sts again the pt has severe reservations on using CPAP due to covid 19 and the possibility of making his family sick if he was to asymptomatic with covid. Wife sts the pt's father recently died from covid.  I advised I understanding on his hesitation on this issue but per Dr. Rexene Alberts during yesterday's visit there no evidence to suggest that CPAP usage spreads the Covid infection in the absence of an actual infection or symptoms.    Pt's wife inquired about inspire treatment and I advised the pt did not meet the qualification since his BMI was above 32.   The pt's wife seemed to have a lot of unanswered questions regarding the information above.  I wrapped the call up by stating at this time Dr. Guadelupe Sabin recommendations were for the pt to start therapy back on the CPAP and show the minimum compliance of 4 hours every night.  I advised we could always schedule another appointment if the pt felt like this was necessary to help better answer his questions. She verbalized understanding and sts she will talk this over with the pt.

## 2019-02-12 ENCOUNTER — Ambulatory Visit: Payer: 59 | Admitting: Family Medicine

## 2019-02-13 DIAGNOSIS — Z6841 Body Mass Index (BMI) 40.0 and over, adult: Secondary | ICD-10-CM | POA: Diagnosis not present

## 2019-02-13 DIAGNOSIS — G932 Benign intracranial hypertension: Secondary | ICD-10-CM | POA: Diagnosis not present

## 2019-02-13 DIAGNOSIS — R8290 Unspecified abnormal findings in urine: Secondary | ICD-10-CM | POA: Diagnosis not present

## 2019-02-13 DIAGNOSIS — M62830 Muscle spasm of back: Secondary | ICD-10-CM | POA: Diagnosis not present

## 2019-02-20 ENCOUNTER — Encounter: Payer: Self-pay | Admitting: Neurology

## 2019-02-20 ENCOUNTER — Ambulatory Visit: Payer: 59 | Admitting: Neurology

## 2019-02-20 ENCOUNTER — Other Ambulatory Visit: Payer: Self-pay

## 2019-02-20 VITALS — BP 127/81 | HR 67 | Temp 97.5°F | Ht 71.0 in | Wt 315.3 lb

## 2019-02-20 DIAGNOSIS — Z8669 Personal history of other diseases of the nervous system and sense organs: Secondary | ICD-10-CM | POA: Diagnosis not present

## 2019-02-20 DIAGNOSIS — R55 Syncope and collapse: Secondary | ICD-10-CM

## 2019-02-20 DIAGNOSIS — G932 Benign intracranial hypertension: Secondary | ICD-10-CM | POA: Diagnosis not present

## 2019-02-20 MED ORDER — TROKENDI XR 50 MG PO CP24: 150.0000 mg | ORAL_CAPSULE | Freq: Every day | ORAL | 3 refills | Status: DC

## 2019-02-20 MED FILL — LEVOTHYROXINE SODIUM 75 MCG: 75 | 90 days supply | Qty: 90 | Fill #0

## 2019-02-20 NOTE — Progress Notes (Signed)
Reason for visit: Pseudotumor cerebri, syncope  Carl Knox is an 53 y.o. male  History of present illness:  Carl Knox is a 54 year old left-handed black male with a history of obesity, severe sleep apnea on CPAP, and a history of pseudotumor cerebri.  He has had episodes of near syncope or syncope induced by laughing episodes.  The last such event occurred on 15 January 2019.  He has been seen through Dr. Rexene Alberts for possible cataplexy, but she felt that this was not likely.  The patient has had a lumbar puncture done in October 2020 that revealed an opening pressure of 26 cm, and opening pressure of 25 cm is required to make the diagnosis of pseudotumor cerebri in adults.  The patient has been seen recently by his ophthalmologist, he was felt to have a normal examination with the right eye, the left eye showed some mild disc edema that had slightly worsened from the last examination.  The patient will be reevaluated within the next month.  The patient is on Trokendi taken 150 mg daily, he tolerates this medication quite well, he feels cognitively that he is performing well at work.  He remains obese.  He returns to the office today for an evaluation.  Past Medical History:  Diagnosis Date  . Morbid obesity ([place])   . OSA on CPAP   . Pseudotumor cerebri   . Syncope and collapse 02/06/2016    Past Surgical History:  Procedure Laterality Date  . CYST EXCISION  2001   Thyglossal duct  . TONSILLECTOMY  1980    Family History  Problem Relation Age of Onset  . Hypertension Mother   . Heart disease Mother   . Kidney disease Mother   . Diabetes Father   . Hypertension Father     Social history:  reports that he has never smoked. He has never used smokeless tobacco. He reports current alcohol use. He reports that he does not use drugs.    Allergies  Allergen Reactions  . Valtrex [Valacyclovir Hcl]     Medications:  Prior to Admission medications   Medication Sig Start Date End  Date Taking? Authorizing Provider  levothyroxine (SYNTHROID, LEVOTHROID) 75 MCG tablet Take 75 mcg by mouth daily. 10/12/16  Yes [provider]  Multiple Vitamin (MULTIVITAMIN) tablet Take 1 tablet by mouth daily.   Yes [provider]  Topiramate ER (TROKENDI XR) 50 MG CP24 Take 150 mg by mouth at bedtime. 01/29/19  Yes Kathrynn Ducking, MD    ROS:  Out of a complete 14 system review of symptoms, the patient complains only of the following symptoms, and all other reviewed systems are negative.  Weight gain Near syncope  Blood pressure 127/81, pulse 67, temperature (!) 97.5 F (36.4 C), temperature source Temporal, height 5\' 11"  (1.803 m), weight (!) 315 lb 5 oz (143 kg).  Physical Exam  General: The patient is alert and cooperative at the time of the examination.  The patient is markedly obese.  Skin: No significant peripheral edema is noted.   Neurologic Exam  Mental status: The patient is alert and oriented x 3 at the time of the examination. The patient has apparent normal recent and remote memory, with an apparently normal attention span and concentration ability.   Cranial nerves: Facial symmetry is present. Speech is normal, no aphasia or dysarthria is noted. Extraocular movements are full. Visual fields are full.  Pupils are equal, round, and reactive to light.  Discs appear to  be relatively unremarkable, no venous pulsations are seen.  Motor: The patient has good strength in all 4 extremities.  Sensory examination: Soft touch sensation is symmetric on the face, arms, and legs.  Coordination: The patient has good finger-nose-finger and heel-to-shin bilaterally.  Gait and station: The patient has a normal gait. Tandem gait is normal. Romberg is negative. No drift is seen.  Reflexes: Deep tendon reflexes are symmetric.   Assessment/Plan:  1.  Obesity  2.  Pseudotumor cerebri, mild  3.  Syncope/near syncope associated with laughing  4.  Sleep  apnea on CPAP  I have indicated that the patient should engage in an aggressive weight loss program, this will improve his pseudotumor cerebri and sleep apnea.  The patient has minimally elevated spinal fluid pressures on lumbar puncture, a pressure of 25 cm of water is required to make the diagnosis of pseudotumor cerebri in an adult, his opening pressure was 26 cm.  Papilledema is mild, I do not believe that his pseudotumor is severe enough to result in syncope with laughing.  There has been no evidence of seizures, the patient is on Trokendi.  If the papilledema seems to worsen on the next ophthalmologic evaluation, I will add a diuretic to his regimen.  In the meantime, the patient will try to lose a significant amount of weight to improve his sleep apnea and pseudotumor issues.  He will follow-up here in 6 months.  Jill Alexanders MD 02/20/2019 7:56 AM  [facility] [address] [place], [place] [address]  Phone [phone] Fax [phone]

## 2019-02-28 DIAGNOSIS — E230 Hypopituitarism: Secondary | ICD-10-CM | POA: Diagnosis not present

## 2019-02-28 DIAGNOSIS — G932 Benign intracranial hypertension: Secondary | ICD-10-CM | POA: Diagnosis not present

## 2019-02-28 DIAGNOSIS — E039 Hypothyroidism, unspecified: Secondary | ICD-10-CM | POA: Diagnosis not present

## 2019-02-28 DIAGNOSIS — G4733 Obstructive sleep apnea (adult) (pediatric): Secondary | ICD-10-CM | POA: Diagnosis not present

## 2019-02-28 DIAGNOSIS — Z13220 Encounter for screening for lipoid disorders: Secondary | ICD-10-CM | POA: Diagnosis not present

## 2019-02-28 DIAGNOSIS — Z1211 Encounter for screening for malignant neoplasm of colon: Secondary | ICD-10-CM | POA: Diagnosis not present

## 2019-02-28 DIAGNOSIS — Z125 Encounter for screening for malignant neoplasm of prostate: Secondary | ICD-10-CM | POA: Diagnosis not present

## 2019-02-28 DIAGNOSIS — Z6841 Body Mass Index (BMI) 40.0 and over, adult: Secondary | ICD-10-CM | POA: Diagnosis not present

## 2019-02-28 DIAGNOSIS — Z0000 Encounter for general adult medical examination without abnormal findings: Secondary | ICD-10-CM | POA: Diagnosis not present

## 2019-03-01 MED FILL — TROKENDI XR 50 MG CAPSULE: 50 | 30 days supply | Qty: 90 | Fill #1

## 2019-03-26 ENCOUNTER — Telehealth: Payer: Self-pay | Admitting: *Deleted

## 2019-03-26 NOTE — Telephone Encounter (Signed)
error 

## 2019-03-26 NOTE — Telephone Encounter (Signed)
Pt wife called need update on husband FMLA  Form. Please call

## 2019-03-28 ENCOUNTER — Telehealth: Payer: Self-pay | Admitting: *Deleted

## 2019-03-28 NOTE — Telephone Encounter (Signed)
Pt wife called need update on pt form. [phone]

## 2019-03-28 NOTE — Telephone Encounter (Signed)
I call pts wife that all forms take 14 days, and Dr. Jannifer Franklin is only in office every other week. I stated form is done and he can review and sign on Monday. The wife verbalized understanding.

## 2019-03-30 MED FILL — TROKENDI XR 50 MG CAPSULE: 50 | 30 days supply | Qty: 90 | Fill #2

## 2019-04-02 NOTE — Telephone Encounter (Signed)
FMLA for pts wife given to Hilda Blades for processing and medical records will fax to wife job.

## 2019-04-13 DIAGNOSIS — G932 Benign intracranial hypertension: Secondary | ICD-10-CM | POA: Diagnosis not present

## 2019-04-19 DIAGNOSIS — E039 Hypothyroidism, unspecified: Secondary | ICD-10-CM | POA: Diagnosis not present

## 2019-04-26 ENCOUNTER — Other Ambulatory Visit: Payer: Self-pay

## 2019-04-26 ENCOUNTER — Encounter (INDEPENDENT_AMBULATORY_CARE_PROVIDER_SITE_OTHER): Payer: Self-pay | Admitting: Family Medicine

## 2019-04-26 ENCOUNTER — Ambulatory Visit (INDEPENDENT_AMBULATORY_CARE_PROVIDER_SITE_OTHER): Payer: 59 | Admitting: Family Medicine

## 2019-04-26 VITALS — BP 121/75 | HR 54 | Temp 98.0°F | Ht 70.0 in | Wt 290.0 lb

## 2019-04-26 DIAGNOSIS — Z6841 Body Mass Index (BMI) 40.0 and over, adult: Secondary | ICD-10-CM

## 2019-04-26 DIAGNOSIS — Z1331 Encounter for screening for depression: Secondary | ICD-10-CM

## 2019-04-26 DIAGNOSIS — E039 Hypothyroidism, unspecified: Secondary | ICD-10-CM

## 2019-04-26 DIAGNOSIS — Z0289 Encounter for other administrative examinations: Secondary | ICD-10-CM

## 2019-04-26 DIAGNOSIS — R5383 Other fatigue: Secondary | ICD-10-CM

## 2019-04-26 DIAGNOSIS — G4733 Obstructive sleep apnea (adult) (pediatric): Secondary | ICD-10-CM | POA: Diagnosis not present

## 2019-04-26 NOTE — Progress Notes (Signed)
Chief Complaint:   OBESITY Carl Knox (MR# [id number]) is a 54 y.o. male who presents for evaluation and treatment of obesity and related comorbidities. Current BMI is Body mass index is 41.61 kg/m. Carl Knox has been struggling with his weight for many years and has been unsuccessful in either losing weight, maintaining weight loss, or reaching his healthy weight goal.  Carl Knox is currently in the action stage of change and ready to dedicate time achieving and maintaining a healthier weight. Carl Knox is interested in becoming our patient and working on intensive lifestyle modifications including (but not limited to) diet and exercise for weight loss.  Carl Knox's habits were reviewed today and are as follows: His family eats meals together, he thinks his family will eat healthier with him, his desired weight loss is 60-70 lbs, he started gaining weight due to family life, his heaviest weight ever was 346 pounds and he is a picky eater and doesn't like to eat healthier foods.  Depression Screen Carl Knox Food and Mood (modified PHQ-9) score was 0.  Depression screen [facility] 2/9 04/26/2019  Decreased Interest 0  Down, Depressed, Hopeless 0  PHQ - 2 Score 0  Altered sleeping 0  Tired, decreased energy 0  Change in appetite 0  Feeling bad or failure about yourself  0  Trouble concentrating 0  Moving slowly or fidgety/restless 0  Suicidal thoughts 0  PHQ-9 Score 0  Difficult doing work/chores Not difficult at all   Subjective:   1. Other fatigue Carl Knox admits to daytime somnolence and denies waking up still tired. Patent has a history of symptoms of daytime fatigue. Carl Knox generally gets 4 or 5 hours of sleep per night, and states that he has generally restful sleep. Snoring is not present. Apneic episodes are not present. Epworth Sleepiness Score is 3.  2. Obstructive sleep apnea Carl Knox stopped wearing his CPAP during COVID-19 pandemic due to concerns about spreading the virus at home.  3.  Hypothyroidism, unspecified type Carl Knox is currently on levothyroxine 75 mcg q daily.  Assessment/Plan:   1. Other fatigue Carl Knox does feel that his weight is causing his energy to be lower than it should be. Fatigue may be related to obesity, depression or many other causes. Labs will be ordered, and in the meanwhile, Carl Knox will focus on self care including making healthy food choices, increasing physical activity and focusing on stress reduction.  - EKG 12-Lead - CBC with Differential/Platelet - Folate - Hemoglobin A1c - Insulin, random - Lipid Panel With LDL/HDL Ratio - Vitamin B12 - VITAMIN D 25 Hydroxy (Vit-D Deficiency, Fractures)  2. Obstructive sleep apnea Carl Knox will follow up with Neurology as directed. He was encouraged to resume CPAP and continue to consistently wear his mask when in public. Intensive lifestyle modifications are the first line treatment for this issue. We discussed several lifestyle modifications today and he will continue to work on diet, exercise and weight loss efforts. We will continue to monitor. Orders and follow up as documented in patient record.   3. Hypothyroidism, unspecified type Patient with long-standing hypothyroidism, Carl Knox will continue his levothyroxine therapy. He appears euthyroid. We will check labs today. Orders and follow up as documented in patient record.  - Comprehensive metabolic panel - T3 - T4, free - TSH  4. Depression screening Carl Knox had a negative depression screening. Depression is commonly associated with obesity and often results in emotional eating behaviors. We will monitor this closely and work on CBT to help improve the non-hunger eating  patterns. Referral to Psychology may be required if no improvement is seen as he continues in our clinic.  5. Class 3 severe obesity with serious comorbidity and body mass index (BMI) of 40.0 to 44.9 in adult, unspecified obesity type Carl Knox) Carl Knox is currently in the action stage of  change and his goal is to continue with weight loss efforts. I recommend Carl Knox begin the structured treatment plan as follows:  He has agreed to the Category 3 Plan.  Exercise goals: No exercise has been prescribed for now, while we concentrate on nutritional changes.   Behavioral modification strategies: no skipping meals.  He was informed of the importance of frequent follow-up visits to maximize his success with intensive lifestyle modifications for his multiple health conditions. He was informed we would discuss his lab results at his next visit unless there is a critical issue that needs to be addressed sooner. Carl Knox agreed to keep his next visit at the agreed upon time to discuss these results.  Objective:   Blood pressure 121/75, pulse (!) 54, temperature 98 F (36.7 C), temperature source Oral, height 5\' 10"  (1.778 m), weight 290 lb (131.5 kg), SpO2 98 %. Body mass index is 41.61 kg/m.  EKG: Normal sinus rhythm, rate 58 BPM.  Indirect Calorimeter completed today shows a VO2 of 288 and a REE of 2002.  His calculated basal metabolic rate is [id number] thus his basal metabolic rate is worse than expected.  General: Cooperative, alert, well developed, in no acute distress. HEENT: Conjunctivae and lids unremarkable. Cardiovascular: Regular rhythm.  Lungs: Normal work of breathing. Neurologic: No focal deficits.   Lab Results  Component Value Date   CREATININE 1.31 (H) 04/27/2016   BUN 14 04/27/2016   NA 141 04/27/2016   K 5.1 04/27/2016   CL 102 04/27/2016   CO2 24 04/27/2016   Lab Results  Component Value Date   ALT 22 04/27/2016   AST 28 04/27/2016   ALKPHOS 94 04/27/2016   BILITOT 0.6 04/27/2016   No results found for: HGBA1C No results found for: INSULIN No results found for: TSH No results found for: CHOL, HDL, LDLCALC, LDLDIRECT, TRIG, CHOLHDL Lab Results  Component Value Date   WBC 4.5 04/27/2016   HGB 14.0 04/27/2016   HCT 40.9 04/27/2016   MCV 77 (L)  04/27/2016   PLT 278 04/27/2016   No results found for: IRON, TIBC, FERRITIN  Attestation Statements:   Reviewed by clinician on day of visit: allergies, medications, problem list, medical history, surgical history, family history, social history, and previous encounter notes.   I, Trixie Dredge, am acting as transcriptionist for Dennard Nip, MD.  I have reviewed the above documentation for accuracy and completeness, and I agree with the above. - Dennard Nip, MD

## 2019-04-27 LAB — COMPREHENSIVE METABOLIC PANEL
ALT: 21 IU/L (ref 0–44)
AST: 25 IU/L (ref 0–40)
Albumin/Globulin Ratio: 1.6 (ref 1.2–2.2)
Albumin: 4.6 g/dL (ref 3.8–4.9)
Alkaline Phosphatase: 111 IU/L (ref 39–117)
BUN/Creatinine Ratio: 15 (ref 9–20)
BUN: 19 mg/dL (ref 6–24)
Bilirubin Total: 0.5 mg/dL (ref 0.0–1.2)
CO2: 24 mmol/L (ref 20–29)
Calcium: 9.6 mg/dL (ref 8.7–10.2)
Chloride: 108 mmol/L — ABNORMAL HIGH (ref 96–106)
Creatinine, Ser: 1.24 mg/dL (ref 0.76–1.27)
GFR calc Af Amer: 77 mL/min/{1.73_m2} (ref >59)
GFR calc non Af Amer: 66 mL/min/{1.73_m2} (ref >59)
Globulin, Total: 2.8 g/dL (ref 1.5–4.5)
Glucose: 87 mg/dL (ref 65–99)
Potassium: 4.6 mmol/L (ref 3.5–5.2)
Sodium: 143 mmol/L (ref 134–144)
Total Protein: 7.4 g/dL (ref 6.0–8.5)

## 2019-04-27 LAB — CBC WITH DIFFERENTIAL/PLATELET
Basophils Absolute: 0.1 10*3/uL (ref 0.0–0.2)
Basos: 1 %
EOS (ABSOLUTE): 0.1 10*3/uL (ref 0.0–0.4)
Eos: 4 %
Hematocrit: 44.3 % (ref 37.5–51.0)
Hemoglobin: 14.8 g/dL (ref 13.0–17.7)
Immature Grans (Abs): 0 10*3/uL (ref 0.0–0.1)
Immature Granulocytes: 0 %
Lymphocytes Absolute: 1.2 10*3/uL (ref 0.7–3.1)
Lymphs: 31 %
MCH: 26.5 pg — ABNORMAL LOW (ref 26.6–33.0)
MCHC: 33.4 g/dL (ref 31.5–35.7)
MCV: 79 fL (ref 79–97)
Monocytes Absolute: 0.5 10*3/uL (ref 0.1–0.9)
Monocytes: 12 %
Neutrophils Absolute: 2.1 10*3/uL (ref 1.4–7.0)
Neutrophils: 52 %
Platelets: 256 10*3/uL (ref 150–450)
RBC: 5.59 x10E6/uL (ref 4.14–5.80)
RDW: 14.3 % (ref 11.6–15.4)
WBC: 4 10*3/uL (ref 3.4–10.8)

## 2019-04-27 LAB — LIPID PANEL WITH LDL/HDL RATIO
Cholesterol, Total: 117 mg/dL (ref 100–199)
HDL: 43 mg/dL (ref >39)
LDL Chol Calc (NIH): 57 mg/dL (ref 0–99)
LDL/HDL Ratio: 1.3 ratio (ref 0.0–3.6)
Triglycerides: 87 mg/dL (ref 0–149)
VLDL Cholesterol Cal: 17 mg/dL (ref 5–40)

## 2019-04-27 LAB — VITAMIN B12: Vitamin B-12: 1067 pg/mL (ref 232–1245)

## 2019-04-27 LAB — FOLATE: Folate: 11.9 ng/mL (ref >3.0)

## 2019-04-27 LAB — VITAMIN D 25 HYDROXY (VIT D DEFICIENCY, FRACTURES): Vit D, 25-Hydroxy: 32.2 ng/mL (ref 30.0–100.0)

## 2019-04-27 LAB — T4, FREE: Free T4: 1.11 ng/dL (ref 0.82–1.77)

## 2019-04-27 LAB — HEMOGLOBIN A1C
Est. average glucose Bld gHb Est-mCnc: 108 mg/dL
Hgb A1c MFr Bld: 5.4 % (ref 4.8–5.6)

## 2019-04-27 LAB — INSULIN, RANDOM: INSULIN: 6.1 u[IU]/mL (ref 2.6–24.9)

## 2019-04-27 LAB — T3: T3, Total: 92 ng/dL (ref 71–180)

## 2019-04-27 LAB — TSH: TSH: 3.22 u[IU]/mL (ref 0.450–4.500)

## 2019-05-02 ENCOUNTER — Telehealth: Payer: Self-pay | Admitting: Neurology

## 2019-05-02 NOTE — Telephone Encounter (Signed)
The patient was seen by Dr. Jola Schmidt from ophthalmology.  Examination was on 04/17/2019.  There has been interval improvement in the appearance of optic disc edema on the left eye, previously the right eye appeared to be normal as well.  Exam suggests improvement.

## 2019-05-04 MED FILL — TROKENDI XR 50 MG CAPSULE: 50 | 30 days supply | Qty: 90 | Fill #3

## 2019-05-09 ENCOUNTER — Ambulatory Visit: Payer: 59 | Admitting: Family Medicine

## 2019-05-09 ENCOUNTER — Encounter: Payer: Self-pay | Admitting: Family Medicine

## 2019-05-09 ENCOUNTER — Other Ambulatory Visit: Payer: Self-pay

## 2019-05-09 VITALS — BP 112/69 | HR 64 | Temp 97.4°F | Ht 71.0 in | Wt 293.0 lb

## 2019-05-09 DIAGNOSIS — G932 Benign intracranial hypertension: Secondary | ICD-10-CM | POA: Diagnosis not present

## 2019-05-09 DIAGNOSIS — G4733 Obstructive sleep apnea (adult) (pediatric): Secondary | ICD-10-CM | POA: Diagnosis not present

## 2019-05-09 DIAGNOSIS — Z9989 Dependence on other enabling machines and devices: Secondary | ICD-10-CM

## 2019-05-09 DIAGNOSIS — R55 Syncope and collapse: Secondary | ICD-10-CM

## 2019-05-09 NOTE — Progress Notes (Signed)
PATIENT: Carl Knox DOB: [birth year]  REASON FOR VISIT: follow up HISTORY FROM: patient  Chief Complaint  Patient presents with  . Follow-up    Syncope and collapse, rm 5, with wife,      HISTORY OF PRESENT ILLNESS: Today 05/09/19 Carl Knox is a 54 y.o. male here today for follow up. He is now seeing Dr Leafy Ro for weight management. Ophthalmology exam showed improvement of left papilledema. He continues to tolerate Trokendi 150mg  daily. He has lost 22 pounds over the past 2 months.  He has follow-up with Dr. Leafy Ro tomorrow.  He denies any recent syncopal episodes.  He has not resumed CPAP therapy.  He is concerned that he may be at a higher risk to expose his family to Covid if he is using his machine.  He lost his dad in September to Covid infection.  HISTORY: (copied from Dr Jannifer Franklin' note on 02/20/2019)  Mr. Vista is a 54 year old left-handed black male with a history of obesity, severe sleep apnea on CPAP, and a history of pseudotumor cerebri.  He has had episodes of near syncope or syncope induced by laughing episodes.  The last such event occurred on 15 January 2019.  He has been seen through Dr. Rexene Alberts for possible cataplexy, but she felt that this was not likely.  The patient has had a lumbar puncture done in October 2020 that revealed an opening pressure of 26 cm, and opening pressure of 25 cm is required to make the diagnosis of pseudotumor cerebri in adults.  The patient has been seen recently by his ophthalmologist, he was felt to have a normal examination with the right eye, the left eye showed some mild disc edema that had slightly worsened from the last examination.  The patient will be reevaluated within the next month.  The patient is on Trokendi taken 150 mg daily, he tolerates this medication quite well, he feels cognitively that he is performing well at work.  He remains obese.  He returns to the office today for an evaluation.   REVIEW OF SYSTEMS: Out of a complete  14 system review of symptoms, the patient complains only of the following symptoms, syncope and all other reviewed systems are negative.  ALLERGIES: Allergies  Allergen Reactions  . Valtrex [Valacyclovir Hcl]     HOME MEDICATIONS: Outpatient Medications Prior to Visit  Medication Sig Dispense Refill  . cetirizine (ZYRTEC) 10 MG tablet Take 10 mg by mouth daily.    Marland Kitchen levothyroxine (SYNTHROID, LEVOTHROID) 75 MCG tablet Take 75 mcg by mouth daily.    . Multiple Vitamin (MULTIVITAMIN) tablet Take 1 tablet by mouth daily.    . Topiramate ER (TROKENDI XR) 50 MG CP24 Take 150 mg by mouth at bedtime. 270 capsule 3   No facility-administered medications prior to visit.    PAST MEDICAL HISTORY: Past Medical History:  Diagnosis Date  . Hypothyroidism   . Morbid obesity ([place])   . OSA on CPAP   . Other specified disorders of thyroid   . Papilledema   . Pseudotumor cerebri   . Sickle cell trait ([place])   . Syncope and collapse 02/06/2016    PAST SURGICAL HISTORY: Past Surgical History:  Procedure Laterality Date  . CYST EXCISION  2001   Thyglossal duct  . TONSILLECTOMY  1980    FAMILY HISTORY: Family History  Problem Relation Age of Onset  . Hypertension Mother   . Heart disease Mother   . Kidney disease Mother   . Hyperlipidemia Mother   .  Sleep apnea Mother   . Obesity Mother   . Diabetes Father   . Hypertension Father   . Hyperlipidemia Father   . Heart disease Father   . Kidney disease Father   . Obesity Father     SOCIAL HISTORY: Social History   Socioeconomic History  . Marital status: Married    Spouse name: Darlene  . Number of children: 2  . Years of education: Some grad school  . Highest education level: Not on file  Occupational History  . Occupation: Heavy Glass blower/designer  Tobacco Use  . Smoking status: Never Smoker  . Smokeless tobacco: Never Used  Substance and Sexual Activity  . Alcohol use: Yes    Alcohol/week: 0.0 standard drinks    Comment:  Occasional  . Drug use: No  . Sexual activity: Not on file    Comment: Married  Other Topics Concern  . Not on file  Social History Narrative   Lives at home w/ his wife   Left-handed   Caffeine: 2 cups daily   Social Determinants of Health   Financial Resource Strain:   . Difficulty of Paying Living Expenses:   Food Insecurity:   . Worried About Charity fundraiser in the Last Year:   . Arboriculturist in the Last Year:   Transportation Needs:   . Film/video editor (Medical):   Marland Kitchen Lack of Transportation (Non-Medical):   Physical Activity:   . Days of Exercise per Week:   . Minutes of Exercise per Session:   Stress:   . Feeling of Stress :   Social Connections:   . Frequency of Communication with Friends and Family:   . Frequency of Social Gatherings with Friends and Family:   . Attends Religious Services:   . Active Member of Clubs or Organizations:   . Attends Archivist Meetings:   Marland Kitchen Marital Status:   Intimate Partner Violence:   . Fear of Current or Ex-Partner:   . Emotionally Abused:   Marland Kitchen Physically Abused:   . Sexually Abused:       PHYSICAL EXAM  Vitals:   05/09/19 1341  BP: 112/69  Pulse: 64  Temp: (!) 97.4 F (36.3 C)  Weight: 293 lb (132.9 kg)  Height: 5\' 11"  (1.803 m)   Body mass index is 40.87 kg/m.  Generalized: Well developed, in no acute distress  Cardiology: normal rate and rhythm, no murmur noted Respiratory: clear to auscultation bilaterally  Neurological examination  Mentation: Alert oriented to time, place, history taking. Follows all commands speech and language fluent Cranial nerve II-XII: Pupils were equal round reactive to light. Extraocular movements were full, visual field were full on confrontational test. Facial sensation and strength were normal. Uvula tongue midline. Head turning and shoulder shrug  were normal and symmetric. Motor: The motor testing reveals 5 over 5 strength of all 4 extremities. Good symmetric  motor tone is noted throughout.  Sensory: Sensory testing is intact to soft touch on all 4 extremities. No evidence of extinction is noted.  Coordination: Cerebellar testing reveals good finger-nose-finger and heel-to-shin bilaterally.  Gait and station: Gait is normal.   DIAGNOSTIC DATA (LABS, IMAGING, TESTING) - I reviewed patient records, labs, notes, testing and imaging myself where available.  No flowsheet data found.   Lab Results  Component Value Date   WBC 4.0 04/26/2019   HGB 14.8 04/26/2019   HCT 44.3 04/26/2019   MCV 79 04/26/2019   PLT 256 04/26/2019  Component Value Date/Time   NA 143 04/26/2019 0944   K 4.6 04/26/2019 0944   CL 108 (H) 04/26/2019 0944   CO2 24 04/26/2019 0944   GLUCOSE 87 04/26/2019 0944   BUN 19 04/26/2019 0944   CREATININE 1.24 04/26/2019 0944   CALCIUM 9.6 04/26/2019 0944   PROT 7.4 04/26/2019 0944   ALBUMIN 4.6 04/26/2019 0944   AST 25 04/26/2019 0944   ALT 21 04/26/2019 0944   ALKPHOS 111 04/26/2019 0944   BILITOT 0.5 04/26/2019 0944   GFRNONAA 66 04/26/2019 0944   GFRAA 77 04/26/2019 0944   Lab Results  Component Value Date   CHOL 117 04/26/2019   HDL 43 04/26/2019   LDLCALC 57 04/26/2019   TRIG 87 04/26/2019   Lab Results  Component Value Date   HGBA1C 5.4 04/26/2019   Lab Results  Component Value Date   VITAMINB12 1,067 04/26/2019   Lab Results  Component Value Date   TSH 3.220 04/26/2019       ASSESSMENT AND PLAN 54 y.o. year old male  has a past medical history of Hypothyroidism, Morbid obesity ([place]), OSA on CPAP, Other specified disorders of thyroid, Papilledema, Pseudotumor cerebri, Sickle cell trait ([place]), and Syncope and collapse (02/06/2016). here with     ICD-10-CM   1. Pseudotumor cerebri  G93.2   2. Obstructive sleep apnea on CPAP  G47.33    Z99.89   3. Recurrent syncope  R55     Mr Collinsworth is doing well today.  He has been working on weight loss goals and is now down 22 pounds.  He continues close  follow-up with Dr. Leafy Ro.  He is tolerating Trokendi 150 mg at bedtime.  No recent syncopal episodes.  No concerns of headaches.  Ophthalmology exam showed improvement in left papilledema in March.  He has not resume CPAP therapy.  We have had a lengthy discussion today regarding concerns of sleep apnea in relationship his comorbidities as well as risk of untreated sleep apnea.  His wife has received both Covid vaccines and is fully vaccinated.  The risk of her becoming infected with Covid 19 from him using CPAP therapy is relatively low.  I have encouraged him to consider resuming CPAP therapy nightly and greater than 4 hours each night.  I have commended him on his weight loss efforts and encouraged him to continue working on a well-balanced diet and regular exercise.  He has an established appointment with Dr. Jannifer Franklin in August.  He will follow-up at that time.  He will call sooner with any concerns.  He and his wife both verbalized understanding and agreement with this plan.   No orders of the defined types were placed in this encounter.    No orders of the defined types were placed in this encounter.     I spent 25 minutes with the patient. 50% of this time was spent counseling and educating patient on plan of care and medications.    Debbora Presto, FNP-C 05/09/2019, 3:36 PM [facility] [address] [place], [place] [address] [phone]

## 2019-05-09 NOTE — Patient Instructions (Addendum)
Continue Trokendi 150mg  at bedtime. Continue close follow up with PCP and Dr Leafy Ro for weight management. Stay well hydrated. Try to exercise regularly.   Follow up with Dr Jannifer Franklin as already scheduled on 8/17.    Please continue using your CPAP regularly. While your insurance requires that you use CPAP at least 4 hours each night on 70% of the nights, I recommend, that you not skip any nights and use it throughout the night if you can. Getting used to CPAP and staying with the treatment long term does take time and patience and discipline. Untreated obstructive sleep apnea when it is moderate to severe can have an adverse impact on cardiovascular health and raise her risk for heart disease, arrhythmias, hypertension, congestive heart failure, stroke and diabetes. Untreated obstructive sleep apnea causes sleep disruption, nonrestorative sleep, and sleep deprivation. This can have an impact on your day to day functioning and cause daytime sleepiness and impairment of cognitive function, memory loss, mood disturbance, and problems focussing. Using CPAP regularly can improve these symptoms.   Idiopathic Intracranial Hypertension  Idiopathic intracranial hypertension (IIH) is a condition that increases pressure around the brain. The fluid that surrounds the brain and spinal cord (cerebrospinal fluid, CSF) increases and causes the pressure. Idiopathic means that the cause of this condition is not known. IIH affects the brain and spinal cord (is a neurological disorder). If this condition is not treated, it can cause vision loss or blindness. What increases the risk? You are more likely to develop this condition if:  You are severely overweight (obese).  You are a woman who has not gone through menopause.  You take certain medicines, such as birth control or steroids. What are the signs or symptoms? Symptoms of IIH include:  Headaches. This is the most common symptom.  Pain in the shoulders or  neck.  Nausea and vomiting.  A "rushing water" or pulsing sound within the ears (pulsatile tinnitus).  Double vision.  Blurred vision.  Brief episodes of complete vision loss. How is this diagnosed? This condition may be diagnosed based on:  Your symptoms.  Your medical history.  CT scan of the brain.  MRI of the brain.  Magnetic resonance venogram (MRV) to check veins in the brain.  Diagnostic lumbar puncture. This is a procedure to remove and examine a sample of cerebrospinal fluid. This procedure can determine whether too much fluid may be causing IIH.  A thorough eye exam to check for swelling or nerve damage in the eyes. How is this treated? Treatment for this condition depends on your symptoms. The goal of treatment is to decrease the pressure around your brain. Common treatments include:  Medicines to decrease the production of spinal fluid and lower the pressure within your skull.  Medicines to prevent or treat headaches.  Surgery to place drains (shunts) in your brain to remove excess fluid.  Lumbar puncture to remove excess cerebrospinal fluid. Follow these instructions at home:  If you are overweight or obese, work with your health care provider to lose weight.  Take over-the-counter and prescription medicines only as told by your health care provider.  Do not drive or use heavy machinery while taking medicines that can make you sleepy.  Keep all follow-up visits as told by your health care provider. This is important. Contact a health care provider if:  You have changes in your vision, such as: ? Double vision. ? Not being able to see colors (color vision). Get help right away if:  You  have any of the following symptoms and they get worse or do not get better. ? Headaches. ? Nausea. ? Vomiting. ? Vision changes or difficulty seeing. Summary  Idiopathic intracranial hypertension (IIH) is a condition that increases pressure around the brain. The  cause is not known (is idiopathic).  The most common symptom of IIH is headaches.  Treatment may include medicines or surgery to relieve the pressure on your brain. This information is not intended to replace advice given to you by your health care provider. Make sure you discuss any questions you have with your health care provider. Document Revised: 12/17/2016 Document Reviewed: 11/26/2015 Elsevier Patient Education  2020 [employer].

## 2019-05-10 ENCOUNTER — Encounter (INDEPENDENT_AMBULATORY_CARE_PROVIDER_SITE_OTHER): Payer: Self-pay | Admitting: Family Medicine

## 2019-05-10 ENCOUNTER — Other Ambulatory Visit: Payer: Self-pay

## 2019-05-10 ENCOUNTER — Ambulatory Visit (INDEPENDENT_AMBULATORY_CARE_PROVIDER_SITE_OTHER): Payer: 59 | Admitting: Family Medicine

## 2019-05-10 VITALS — BP 113/70 | HR 54 | Temp 97.8°F | Ht 70.0 in | Wt 289.0 lb

## 2019-05-10 DIAGNOSIS — E559 Vitamin D deficiency, unspecified: Secondary | ICD-10-CM | POA: Diagnosis not present

## 2019-05-10 DIAGNOSIS — Z6841 Body Mass Index (BMI) 40.0 and over, adult: Secondary | ICD-10-CM

## 2019-05-10 MED ORDER — VITAMIN D (ERGOCALCIFEROL) 1.25 MG (50000 UNIT) PO CAPS: 50000.0000 [IU] | ORAL_CAPSULE | ORAL | 0 refills | Status: DC

## 2019-05-10 MED FILL — VIT D2 1.25 MG (50,000 UNIT: 1.25 MG | 28 days supply | Qty: 4 | Fill #0

## 2019-05-11 NOTE — Progress Notes (Signed)
I have read the note, and I agree with the clinical assessment and plan.  Charles K Willis   

## 2019-05-15 NOTE — Progress Notes (Signed)
Chief Complaint:   OBESITY Carl Knox is here to discuss his progress with his obesity treatment plan along with follow-up of his obesity related diagnoses. Carl Knox is on the Category 3 Plan and states he is following his eating plan approximately 100% of the time. Carl Knox states he is doing pull ups, situps, pushups, squats, and stretches for 10-15 minutes 3 times per week.  Today's visit was #: 2 Starting weight: 290 lbs Starting date: 04/26/2019 Today's weight: 289 lbs Today's date: 05/10/2019 Total lbs lost to date: 1 Total lbs lost since last in-office visit: 1  Interim History: Carl Knox has done well with weight loss on his Category 3 plan. His hunger was controlled and he was satisfied with his food choices.  Subjective:   1. Vitamin D deficiency Carl Knox has a new diagnosis of Vit D deficiency. He is not on Vit D, and his level is lower than goal. He notes mild fatigue. I discussed labs with the patient today.  Assessment/Plan:   1. Vitamin D deficiency Low Vitamin D level contributes to fatigue and are associated with obesity, breast, and colon cancer. Carl Knox agreed to start prescription Vitamin D 50,000 IU every week with no refills. He will follow-up for routine testing of Vitamin D, at least 2-3 times per year to avoid over-replacement.  - Vitamin D, Ergocalciferol, (DRISDOL) 1.25 MG (50000 UNIT) CAPS capsule; Take 1 capsule (50,000 Units total) by mouth every 7 (seven) days.  Dispense: 4 capsule; Refill: 0  2. Class 3 severe obesity with serious comorbidity and body mass index (BMI) of 40.0 to 44.9 in adult, unspecified obesity type [facility]) Carl Knox is currently in the action stage of change. As such, his goal is to continue with weight loss efforts. He has agreed to the Category 3 Plan.   Exercise goals: As is.  Behavioral modification strategies: increasing lean protein intake and no skipping meals.  Carl Knox has agreed to follow-up with our clinic in 2 weeks. He was informed of the  importance of frequent follow-up visits to maximize his success with intensive lifestyle modifications for his multiple health conditions.   Objective:   Blood pressure 113/70, pulse (!) 54, temperature 97.8 F (36.6 C), temperature source Oral, height 5\' 10"  (1.778 m), weight 289 lb (131.1 kg), SpO2 98 %. Body mass index is 41.47 kg/m.  General: Cooperative, alert, well developed, in no acute distress. HEENT: Conjunctivae and lids unremarkable. Cardiovascular: Regular rhythm.  Lungs: Normal work of breathing. Neurologic: No focal deficits.   Lab Results  Component Value Date   CREATININE 1.24 04/26/2019   BUN 19 04/26/2019   NA 143 04/26/2019   K 4.6 04/26/2019   CL 108 (H) 04/26/2019   CO2 24 04/26/2019   Lab Results  Component Value Date   ALT 21 04/26/2019   AST 25 04/26/2019   ALKPHOS 111 04/26/2019   BILITOT 0.5 04/26/2019   Lab Results  Component Value Date   HGBA1C 5.4 04/26/2019   Lab Results  Component Value Date   INSULIN 6.1 04/26/2019   Lab Results  Component Value Date   TSH 3.220 04/26/2019   Lab Results  Component Value Date   CHOL 117 04/26/2019   HDL 43 04/26/2019   LDLCALC 57 04/26/2019   TRIG 87 04/26/2019   Lab Results  Component Value Date   WBC 4.0 04/26/2019   HGB 14.8 04/26/2019   HCT 44.3 04/26/2019   MCV 79 04/26/2019   PLT 256 04/26/2019   No results found for:  IRON, TIBC, FERRITIN  Attestation Statements:   Reviewed by clinician on day of visit: allergies, medications, problem list, medical history, surgical history, family history, social history, and previous encounter notes.  Time spent on visit including pre-visit chart review and post-visit care and charting was 35 minutes.    I, Trixie Dredge, am acting as transcriptionist for Dennard Nip, MD.  I have reviewed the above documentation for accuracy and completeness, and I agree with the above. -  Dennard Nip, MD

## 2019-05-30 ENCOUNTER — Ambulatory Visit (INDEPENDENT_AMBULATORY_CARE_PROVIDER_SITE_OTHER): Payer: 59 | Admitting: Family Medicine

## 2019-05-30 ENCOUNTER — Other Ambulatory Visit: Payer: Self-pay

## 2019-05-30 ENCOUNTER — Encounter (INDEPENDENT_AMBULATORY_CARE_PROVIDER_SITE_OTHER): Payer: Self-pay | Admitting: Family Medicine

## 2019-05-30 VITALS — BP 109/67 | HR 51 | Temp 98.0°F | Ht 70.0 in | Wt 285.0 lb

## 2019-05-30 DIAGNOSIS — Z6841 Body Mass Index (BMI) 40.0 and over, adult: Secondary | ICD-10-CM

## 2019-05-30 DIAGNOSIS — Z9189 Other specified personal risk factors, not elsewhere classified: Secondary | ICD-10-CM | POA: Diagnosis not present

## 2019-05-30 DIAGNOSIS — G4709 Other insomnia: Secondary | ICD-10-CM

## 2019-05-30 DIAGNOSIS — E559 Vitamin D deficiency, unspecified: Secondary | ICD-10-CM | POA: Diagnosis not present

## 2019-05-30 MED ORDER — VITAMIN D (ERGOCALCIFEROL) 1.25 MG (50000 UNIT) PO CAPS: 50000.0000 [IU] | ORAL_CAPSULE | ORAL | 0 refills | Status: DC

## 2019-05-30 NOTE — Progress Notes (Signed)
Chief Complaint:   OBESITY Carl Knox is here to discuss his progress with his obesity treatment plan along with follow-up of his obesity related diagnoses. [name] is on the Category 3 Plan and states he is following his eating plan approximately 95% of the time. [name] states he is doing calisthenics for 15 minutes 3-4 times per week.  Today's visit was #: 3 Starting weight: 290 lbs Starting date: 04/26/2019 Today's weight: 285 lbs Today's date: 05/30/2019 Total lbs lost to date: 5 Total lbs lost since last in-office visit: 4  Interim History: [name] continues to lose weight well on his plan, but he is struggling to eat his lunch due to time restrictions at work. His hunger is controlled overall.  Subjective:   1. Vitamin D deficiency Carl Knox is stable on Vit D, and he denies nausea, vomiting, or muscle weakness.  2. Other insomnia Carl Knox has a new diagnosis of insomnia. He is working irregular shifts and he notes his sleep schedule is affected, and he is not getting the same quality or quantity of sleep.  3. At risk for osteoporosis Carl Knox is at higher risk of osteopenia and osteoporosis due to Vitamin D deficiency.   Assessment/Plan:   1. Vitamin D deficiency Low Vitamin D level contributes to fatigue and are associated with obesity, breast, and colon cancer. We will refill prescription Vitamin D for 1 month. [name] will follow-up for routine testing of Vitamin D, at least 2-3 times per year to avoid over-replacement. We will recheck labs in 2 months.  - Vitamin D, Ergocalciferol, (DRISDOL) 1.25 MG (50000 UNIT) CAPS capsule; Take 1 capsule (50,000 Units total) by mouth every 7 (seven) days.  Dispense: 4 capsule; Refill: 0  2. Other insomnia The problem of recurrent insomnia was discussed. [name] was educated on the importance of sleep and weight loss as well as general health. He will try to make up for his sleep deprivation on the weekends. Orders and follow up as documented in patient  record. Counseling: Intensive lifestyle modifications are the first line treatment for this issue. We discussed several lifestyle modifications today and he will continue to work on diet, exercise and weight loss efforts.   3. At risk for osteoporosis Carl Knox was given up to 15 minutes of prevention counseling today. Carl Knox is at risk for osteopenia and osteoporosis due to his Vitamin D deficiency. He was encouraged to take his Vitamin D and follow his higher calcium diet and increase strengthening exercise to help strengthen his bones and decrease his risk of osteopenia and osteoporosis.  Repetitive spaced learning was employed today to elicit superior memory formation and behavioral change.  4. Class 3 severe obesity with serious comorbidity and body mass index (BMI) of 40.0 to 44.9 in adult, unspecified obesity type [facility]) Carl Knox is currently in the action stage of change. As such, his goal is to continue with weight loss efforts. He has agreed to the Category 3 Plan.   Exercise goals: As is.  Behavioral modification strategies: no skipping meals and meal planning and cooking strategies.  Carl Knox has agreed to follow-up with our clinic in 3 weeks. He was informed of the importance of frequent follow-up visits to maximize his success with intensive lifestyle modifications for his multiple health conditions.   Objective:   Blood pressure 109/67, pulse (!) 51, temperature 98 F (36.7 C), temperature source Oral, height 5\' 10"  (1.778 m), weight 285 lb (129.3 kg), SpO2 97 %. Body mass index is 40.89 kg/m.  General: Cooperative, alert,  well developed, in no acute distress. HEENT: Conjunctivae and lids unremarkable. Cardiovascular: Regular rhythm.  Lungs: Normal work of breathing. Neurologic: No focal deficits.   Lab Results  Component Value Date   CREATININE 1.24 04/26/2019   BUN 19 04/26/2019   NA 143 04/26/2019   K 4.6 04/26/2019   CL 108 (H) 04/26/2019   CO2 24 04/26/2019   Lab Results   Component Value Date   ALT 21 04/26/2019   AST 25 04/26/2019   ALKPHOS 111 04/26/2019   BILITOT 0.5 04/26/2019   Lab Results  Component Value Date   HGBA1C 5.4 04/26/2019   Lab Results  Component Value Date   INSULIN 6.1 04/26/2019   Lab Results  Component Value Date   TSH 3.220 04/26/2019   Lab Results  Component Value Date   CHOL 117 04/26/2019   HDL 43 04/26/2019   LDLCALC 57 04/26/2019   TRIG 87 04/26/2019   Lab Results  Component Value Date   WBC 4.0 04/26/2019   HGB 14.8 04/26/2019   HCT 44.3 04/26/2019   MCV 79 04/26/2019   PLT 256 04/26/2019   No results found for: IRON, TIBC, FERRITIN  Attestation Statements:   Reviewed by clinician on day of visit: allergies, medications, problem list, medical history, surgical history, family history, social history, and previous encounter notes.   I, Trixie Dredge, am acting as transcriptionist for Dennard Nip, MD.  I have reviewed the above documentation for accuracy and completeness, and I agree with the above. -  Dennard Nip, MD

## 2019-06-05 ENCOUNTER — Other Ambulatory Visit: Payer: Self-pay | Admitting: Family Medicine

## 2019-06-05 ENCOUNTER — Other Ambulatory Visit: Payer: Self-pay | Admitting: Neurology

## 2019-06-05 ENCOUNTER — Telehealth: Payer: Self-pay | Admitting: Family Medicine

## 2019-06-05 MED ORDER — TROKENDI XR 50 MG PO CP24: 150.0000 mg | ORAL_CAPSULE | Freq: Every day | ORAL | 3 refills | Status: DC

## 2019-06-05 NOTE — Telephone Encounter (Signed)
Pt is needing a refill on his Topiramate ER (TROKENDI XR) 50 MG CP24 sent in to the [facility] has sent in the request as well.

## 2019-06-06 MED FILL — TROKENDI XR 50 MG CAPSULE: 50 | 90 days supply | Qty: 270 | Fill #0

## 2019-06-20 ENCOUNTER — Ambulatory Visit (INDEPENDENT_AMBULATORY_CARE_PROVIDER_SITE_OTHER): Payer: 59 | Admitting: Family Medicine

## 2019-06-20 ENCOUNTER — Encounter (INDEPENDENT_AMBULATORY_CARE_PROVIDER_SITE_OTHER): Payer: Self-pay | Admitting: Family Medicine

## 2019-06-20 ENCOUNTER — Other Ambulatory Visit: Payer: Self-pay

## 2019-06-20 VITALS — BP 102/67 | HR 59 | Temp 98.2°F | Ht 70.0 in | Wt 279.0 lb

## 2019-06-20 DIAGNOSIS — Z6841 Body Mass Index (BMI) 40.0 and over, adult: Secondary | ICD-10-CM

## 2019-06-20 DIAGNOSIS — E559 Vitamin D deficiency, unspecified: Secondary | ICD-10-CM

## 2019-06-20 NOTE — Progress Notes (Signed)
Chief Complaint:   OBESITY Jyran is here to discuss his progress with his obesity treatment plan along with follow-up of his obesity related diagnoses. Kriz is on the Category 3 Plan and states he is following his eating plan approximately 100% of the time. Graciano states he is doing calisthenics for 15 minutes 3 times per week.  Today's visit was #: 4 Starting weight: 290 lbs Starting date: 04/26/2019 Today's weight: 279 lbs Today's date: 06/20/2019 Total lbs lost to date: 11 Total lbs lost since last in-office visit: 6  Interim History: Kirtis has enjoyed the structure of the Category 3 meal plan, and he has found the plan to be easy to follow during his hectic schedule.  Subjective:   1. Vitamin D deficiency Luie's Vit D level on 04/26/2019 was 32.2. He is on prescription strength Vit D supplementation, and is tolerating it well.  Assessment/Plan:   1. Vitamin D deficiency Low Vitamin D level contributes to fatigue and are associated with obesity, breast, and colon cancer. Jonquez agreed to continue taking prescription Vitamin D 50,000 IU every week, no refill needed at this time. He will follow-up for routine testing of Vitamin D, at least 2-3 times per year to avoid over-replacement. We will recheck labs in July 2021.  2. Class 3 severe obesity with serious comorbidity and body mass index (BMI) of 40.0 to 44.9 in adult, unspecified obesity type [facility]) Solo is currently in the action stage of change. As such, his goal is to continue with weight loss efforts. He has agreed to the Category 3 Plan.   Exercise goals: As is.  Behavioral modification strategies: increasing lean protein intake, decreasing simple carbohydrates and meal planning and cooking strategies.  Jenson has agreed to follow-up with our clinic in 2 weeks. He was informed of the importance of frequent follow-up visits to maximize his success with intensive lifestyle modifications for his multiple health conditions.    Objective:   Blood pressure 102/67, pulse (!) 59, temperature 98.2 F (36.8 C), temperature source Oral, height 5\' 10"  (1.778 m), weight 279 lb (126.6 kg), SpO2 93 %. Body mass index is 40.03 kg/m.  General: Cooperative, alert, well developed, in no acute distress. HEENT: Conjunctivae and lids unremarkable. Cardiovascular: Regular rhythm.  Lungs: Normal work of breathing. Neurologic: No focal deficits.   Lab Results  Component Value Date   CREATININE 1.24 04/26/2019   BUN 19 04/26/2019   NA 143 04/26/2019   K 4.6 04/26/2019   CL 108 (H) 04/26/2019   CO2 24 04/26/2019   Lab Results  Component Value Date   ALT 21 04/26/2019   AST 25 04/26/2019   ALKPHOS 111 04/26/2019   BILITOT 0.5 04/26/2019   Lab Results  Component Value Date   HGBA1C 5.4 04/26/2019   Lab Results  Component Value Date   INSULIN 6.1 04/26/2019   Lab Results  Component Value Date   TSH 3.220 04/26/2019   Lab Results  Component Value Date   CHOL 117 04/26/2019   HDL 43 04/26/2019   LDLCALC 57 04/26/2019   TRIG 87 04/26/2019   Lab Results  Component Value Date   WBC 4.0 04/26/2019   HGB 14.8 04/26/2019   HCT 44.3 04/26/2019   MCV 79 04/26/2019   PLT 256 04/26/2019   No results found for: IRON, TIBC, FERRITIN  Attestation Statements:   Reviewed by clinician on day of visit: allergies, medications, problem list, medical history, surgical history, family history, social history, and previous encounter  notes.  Time spent on visit including pre-visit chart review and post-visit care and charting was 22 minutes.    I, Trixie Dredge, am acting as transcriptionist for Dennard Nip, MD.  I have reviewed the above documentation for accuracy and completeness, and I agree with the above. -  Dennard Nip, MD

## 2019-07-09 ENCOUNTER — Other Ambulatory Visit (INDEPENDENT_AMBULATORY_CARE_PROVIDER_SITE_OTHER): Payer: Self-pay | Admitting: Family Medicine

## 2019-07-09 DIAGNOSIS — Z23 Encounter for immunization: Secondary | ICD-10-CM | POA: Diagnosis not present

## 2019-07-09 DIAGNOSIS — E559 Vitamin D deficiency, unspecified: Secondary | ICD-10-CM

## 2019-07-10 ENCOUNTER — Encounter (INDEPENDENT_AMBULATORY_CARE_PROVIDER_SITE_OTHER): Payer: Self-pay | Admitting: Family Medicine

## 2019-07-10 ENCOUNTER — Other Ambulatory Visit: Payer: Self-pay

## 2019-07-10 ENCOUNTER — Ambulatory Visit (INDEPENDENT_AMBULATORY_CARE_PROVIDER_SITE_OTHER): Payer: 59 | Admitting: Family Medicine

## 2019-07-10 VITALS — BP 118/73 | HR 67 | Temp 98.2°F | Ht 70.0 in | Wt 279.0 lb

## 2019-07-10 DIAGNOSIS — E559 Vitamin D deficiency, unspecified: Secondary | ICD-10-CM

## 2019-07-10 DIAGNOSIS — Z6841 Body Mass Index (BMI) 40.0 and over, adult: Secondary | ICD-10-CM

## 2019-07-10 MED ORDER — VITAMIN D (ERGOCALCIFEROL) 1.25 MG (50000 UNIT) PO CAPS: 50000.0000 [IU] | ORAL_CAPSULE | ORAL | 0 refills | Status: DC

## 2019-07-10 MED FILL — VIT D2 1.25 MG (50,000 UNIT: 1.25 MG | 28 days supply | Qty: 4 | Fill #0

## 2019-07-11 NOTE — Progress Notes (Signed)
Chief Complaint:   OBESITY Carl Knox is here to discuss his progress with his obesity treatment plan along with follow-up of his obesity related diagnoses. Carl Knox is on the Category 3 Plan and states he is following his eating plan approximately 100% of the time. Carl Knox states he is walking for 15 minutes 3-4 times per week.  Today's visit was #: 5 Starting weight: 290 lbs Starting date: 04/26/2019 Today's weight: 279 lbs Today's date: 07/10/2019 Total lbs lost to date: 11 Total lbs lost since last in-office visit: 0  Interim History: Carl Knox has done well maintaining his weight on his Category 3 plan. His hunger is mostly controlled. He did some celebration eating over the weekend, but he is already back on track.  Subjective:   1. Vitamin D deficiency Carl Knox is stable on Vit D, but level is not yet at goal. He denies nausea, vomiting, or muscle weakness.  Assessment/Plan:   1. Vitamin D deficiency Low Vitamin D level contributes to fatigue and are associated with obesity, breast, and colon cancer. We will refill prescription Vitamin D for 1 month. Carl Knox will follow-up for routine testing of Vitamin D, at least 2-3 times per year to avoid over-replacement. We will recheck labs at his next visit.  - Vitamin D, Ergocalciferol, (DRISDOL) 1.25 MG (50000 UNIT) CAPS capsule; Take 1 capsule (50,000 Units total) by mouth every 7 (seven) days.  Dispense: 4 capsule; Refill: 0  2. Class 3 severe obesity with serious comorbidity and body mass index (BMI) of 40.0 to 44.9 in adult, unspecified obesity type [facility]) Carl Knox is currently in the action stage of change. As such, his goal is to continue with weight loss efforts. He has agreed to the Category 3 Plan.   Exercise goals: As is.  Behavioral modification strategies: celebration eating strategies.  Carl Knox has agreed to follow-up with our clinic in 2 to 3 weeks. He was informed of the importance of frequent follow-up visits to maximize his success  with intensive lifestyle modifications for his multiple health conditions.   Objective:   Blood pressure 118/73, pulse 67, temperature 98.2 F (36.8 C), temperature source Oral, height 5\' 10"  (1.778 m), weight 279 lb (126.6 kg), SpO2 97 %. Body mass index is 40.03 kg/m.  General: Cooperative, alert, well developed, in no acute distress. HEENT: Conjunctivae and lids unremarkable. Cardiovascular: Regular rhythm.  Lungs: Normal work of breathing. Neurologic: No focal deficits.   Lab Results  Component Value Date   CREATININE 1.24 04/26/2019   BUN 19 04/26/2019   NA 143 04/26/2019   K 4.6 04/26/2019   CL 108 (H) 04/26/2019   CO2 24 04/26/2019   Lab Results  Component Value Date   ALT 21 04/26/2019   AST 25 04/26/2019   ALKPHOS 111 04/26/2019   BILITOT 0.5 04/26/2019   Lab Results  Component Value Date   HGBA1C 5.4 04/26/2019   Lab Results  Component Value Date   INSULIN 6.1 04/26/2019   Lab Results  Component Value Date   TSH 3.220 04/26/2019   Lab Results  Component Value Date   CHOL 117 04/26/2019   HDL 43 04/26/2019   LDLCALC 57 04/26/2019   TRIG 87 04/26/2019   Lab Results  Component Value Date   WBC 4.0 04/26/2019   HGB 14.8 04/26/2019   HCT 44.3 04/26/2019   MCV 79 04/26/2019   PLT 256 04/26/2019   No results found for: IRON, TIBC, FERRITIN  Attestation Statements:   Reviewed by clinician on day  of visit: allergies, medications, problem list, medical history, surgical history, family history, social history, and previous encounter notes.    I, Trixie Dredge, am acting as transcriptionist for Dennard Nip, MD.  I have reviewed the above documentation for accuracy and completeness, and I agree with the above. -  Dennard Nip, MD

## 2019-07-30 ENCOUNTER — Ambulatory Visit (INDEPENDENT_AMBULATORY_CARE_PROVIDER_SITE_OTHER): Payer: 59 | Admitting: Adult Health

## 2019-08-03 DIAGNOSIS — G932 Benign intracranial hypertension: Secondary | ICD-10-CM | POA: Diagnosis not present

## 2019-08-08 ENCOUNTER — Other Ambulatory Visit: Payer: Self-pay

## 2019-08-08 ENCOUNTER — Encounter (INDEPENDENT_AMBULATORY_CARE_PROVIDER_SITE_OTHER): Payer: Self-pay | Admitting: Family Medicine

## 2019-08-08 ENCOUNTER — Ambulatory Visit (INDEPENDENT_AMBULATORY_CARE_PROVIDER_SITE_OTHER): Payer: 59 | Admitting: Family Medicine

## 2019-08-08 VITALS — BP 116/75 | HR 63 | Temp 98.0°F | Ht 70.0 in | Wt 279.0 lb

## 2019-08-08 DIAGNOSIS — Z9189 Other specified personal risk factors, not elsewhere classified: Secondary | ICD-10-CM | POA: Diagnosis not present

## 2019-08-08 DIAGNOSIS — Z6841 Body Mass Index (BMI) 40.0 and over, adult: Secondary | ICD-10-CM

## 2019-08-08 DIAGNOSIS — E559 Vitamin D deficiency, unspecified: Secondary | ICD-10-CM

## 2019-08-08 DIAGNOSIS — E038 Other specified hypothyroidism: Secondary | ICD-10-CM | POA: Diagnosis not present

## 2019-08-08 DIAGNOSIS — G4733 Obstructive sleep apnea (adult) (pediatric): Secondary | ICD-10-CM | POA: Diagnosis not present

## 2019-08-08 MED ORDER — VITAMIN D (ERGOCALCIFEROL) 1.25 MG (50000 UNIT) PO CAPS: 50000.0000 [IU] | ORAL_CAPSULE | ORAL | 0 refills | Status: DC

## 2019-08-08 MED FILL — VIT D2 1.25 MG (50,000 UNIT: 1.25 MG | 28 days supply | Qty: 4 | Fill #0

## 2019-08-08 NOTE — Progress Notes (Signed)
Chief Complaint:   OBESITY Carl Knox is here to discuss his progress with his obesity treatment plan along with follow-up of his obesity related diagnoses. Carl Knox is on the Category 3 Plan and states he is following his eating plan approximately 100% of the time. Carl Knox states he is doing general exercise 15 minutes 3 times per week.  Today's visit was #: 6 Starting weight: 290 lbs Starting date: 04/26/2019 Today's weight: 279 lbs Today's date: 08/09/2019 Total lbs lost to date: 11 lbs Total lbs lost since last in-office visit: 0  Interim History: Carl Knox has previously been seen by Dr. Leafy Ro.  This is his first office visit with me.  Carl Knox says that with his job, it is difficult to get off to come her.  He tells me it is difficult to schedule here.  He says he eats all food on plan everyday.  His hunger is well-controlled.  He denies cravings.  Subjective:   1. Other specified hypothyroidism Carl Knox is taking levothyroxine 75 mcg daily.  Thyroid labs in 04/2019 were within normal limits.  He tolerates his medication well with no side effects.  Denies concerns or issues.  No change in dose for many months.  Lab Results  Component Value Date   TSH 3.220 04/26/2019   2. Vitamin D deficiency Carl Knox's Vitamin D level was 32.2 on 04/26/2019. He is currently taking prescription vitamin D 50,000 IU each week. He denies nausea, vomiting or muscle weakness.  3. OSA (obstructive sleep apnea) Carl Knox has a diagnosis of sleep apnea. He reports that he is not using a CPAP regularly.  Carl Knox stopped using his CPAP during COVID.  He is not back to using it.  He says he wakes up feeling rested.  He lives with his wife and 70 year old daughter.  His wife is the only one vaccinated against COVID in the home.  Carl Knox has no plans to get the vaccine nor does his daughter.  4. At increased risk of exposure to COVID-19 virus The patient is at higher risk of COVID-19 infection due to he and his daughter being  unvaccinated (they live together).  Assessment/Plan:   1. Other specified hypothyroidism Patient with long-standing hypothyroidism, on levothyroxine therapy. He appears euthyroid. Orders and follow up as documented in patient record.  [name] says his levothyroxine is managed by Endocrinology.  Denies the need for a refill.  Counseling . Good thyroid control is important for overall health. Supratherapeutic thyroid levels are dangerous and will not improve weight loss results. . The correct way to take levothyroxine is fasting, with water, separated by at least 30 minutes from breakfast, and separated by more than 4 hours from calcium, iron, multivitamins, acid reflux medications (PPIs).   2. Vitamin D deficiency Low Vitamin D level contributes to fatigue and are associated with obesity, breast, and colon cancer. He agrees to continue to take prescription Vitamin D @50 ,000 IU every week and will follow-up for routine testing of Vitamin D, at least 2-3 times per year to avoid over-replacement.  Will recheck vitamin D level today and follow-up. - Vitamin D, Ergocalciferol, (DRISDOL) 1.25 MG (50000 UNIT) CAPS capsule; Take 1 capsule (50,000 Units total) by mouth every 7 (seven) days.  Dispense: 4 capsule; Refill: 0  3. OSA (obstructive sleep apnea) Intensive lifestyle modifications are the first line treatment for this issue. We discussed several lifestyle modifications today and he will continue to work on diet, exercise and weight loss efforts. We will continue to monitor. Orders and follow  up as documented in patient record.  Carl Knox regarding vaccination and importance of that due to his medical conditions.  I also encouraged him to get back on his CPAP machine as poorly controlled OSA can hinder his ability to lose weight.  Counseling  Sleep apnea is a condition in which breathing pauses or becomes shallow during sleep. This happens over and over during the night. This disrupts your sleep  and keeps your body from getting the rest that it needs, which can cause tiredness and lack of energy (fatigue) during the day.  Sleep apnea treatment: If you were given a device to open your airway while you sleep, USE IT!  Sleep hygiene:   Limit or avoid alcohol, caffeinated beverages, and cigarettes, especially close to bedtime.   Do not eat a large meal or eat spicy foods right before bedtime. This can lead to digestive discomfort that can make it hard for you to sleep.  Keep a sleep diary to help you and your health care provider figure out what could be causing your insomnia.  . Make your bedroom a dark, comfortable place where it is easy to fall asleep. ? Put up shades or blackout curtains to block light from outside. ? Use a white noise machine to block noise. ? Keep the temperature cool. . Limit screen use before bedtime. This includes: ? Watching TV. ? Using your smartphone, tablet, or computer. . Stick to a routine that includes going to bed and waking up at the same times every day and night. This can help you fall asleep faster. Consider making a quiet activity, such as reading, part of your nighttime routine. . Try to avoid taking naps during the day so that you sleep better at night. . Get out of bed if you are still awake after 15 minutes of trying to sleep. Keep the lights down, but try reading or doing a quiet activity. When you feel sleepy, go back to bed.  4. At increased risk of exposure to COVID-19 virus Carl Knox was given approximately 15 minutes of COVID prevention counseling today Counseling  COVID-19 is a respiratory infection that is caused by a virus. It can cause serious infections, such as pneumonia, acute respiratory distress syndrome, acute respiratory failure, or sepsis.  You are more likely to develop a serious illness if you are 35 years of age or older, have a weak immune system, live in a nursing home, have chronic disease, or have obesity.  Get  vaccinated as soon as they are available to you.  For our most current information, please visit [email or web address].  Wash your hands often with soap and water for 20 seconds. If soap and water are not available, use alcohol-based hand sanitizer.  Wear a face mask. Make sure your mask covers your nose and mouth.  Maintain at least 6 feet distance from others when in public.  Get help right away if  You have trouble breathing, chest pain, confusion, or other concerning symptoms.  Repetitive spaced learning was employed today to elicit superior memory formation and behavioral change.  5. Class 3 severe obesity with serious comorbidity and body mass index (BMI) of 40.0 to 44.9 in adult, unspecified obesity type Carl Knox) Carl Knox is currently in the action stage of change. As such, his goal is to continue with weight loss efforts. He has agreed to the Category 3 Plan.   Exercise goals: As is.  Behavioral modification strategies: increasing water intake.  Carl Knox has agreed to follow-up with our clinic  in 2-3 weeks. He was informed of the importance of frequent follow-up visits to maximize his success with intensive lifestyle modifications for his multiple health conditions.   Carl Knox was informed we would discuss his lab results at his next visit unless there is a critical issue that needs to be addressed sooner. Carl Knox agreed to keep his next visit at the agreed upon time to discuss these results.  Objective:   Blood pressure 116/75, pulse 63, temperature 98 F (36.7 C), height 5\' 10"  (1.778 m), weight 279 lb (126.6 kg), SpO2 96 %. Body mass index is 40.03 kg/m.  General: Cooperative, alert, well developed, in no acute distress. HEENT: Conjunctivae and lids unremarkable. Cardiovascular: Regular rhythm.  Lungs: Normal work of breathing. Neurologic: No focal deficits.   Lab Results  Component Value Date   CREATININE 1.24 04/26/2019   BUN 19 04/26/2019   NA 143 04/26/2019   K 4.6  04/26/2019   CL 108 (H) 04/26/2019   CO2 24 04/26/2019   Lab Results  Component Value Date   ALT 21 04/26/2019   AST 25 04/26/2019   ALKPHOS 111 04/26/2019   BILITOT 0.5 04/26/2019   Lab Results  Component Value Date   HGBA1C 5.4 04/26/2019   Lab Results  Component Value Date   INSULIN 6.1 04/26/2019   Lab Results  Component Value Date   TSH 3.220 04/26/2019   Lab Results  Component Value Date   CHOL 117 04/26/2019   HDL 43 04/26/2019   LDLCALC 57 04/26/2019   TRIG 87 04/26/2019   Lab Results  Component Value Date   WBC 4.0 04/26/2019   HGB 14.8 04/26/2019   HCT 44.3 04/26/2019   MCV 79 04/26/2019   PLT 256 04/26/2019   Attestation Statements:   Reviewed by clinician on day of visit: allergies, medications, problem list, medical history, surgical history, family history, social history, and previous encounter notes.  I, Water quality scientist, CMA, am acting as Location manager for [employer], DO.  I have reviewed the above documentation for accuracy and completeness, and I agree with the above. -  Mellody Dance, DO

## 2019-08-09 LAB — VITAMIN D 25 HYDROXY (VIT D DEFICIENCY, FRACTURES): Vit D, 25-Hydroxy: 42.6 ng/mL (ref 30.0–100.0)

## 2019-08-21 ENCOUNTER — Telehealth: Payer: Self-pay | Admitting: Neurology

## 2019-08-21 NOTE — Telephone Encounter (Signed)
Ophthalmology evaluation from August 07, 2019 reveals a flat optic disks on the right and a stable appearance of the optic nerve on the left with mild temporal elevation that was not clearly edematous.  The macula was normal.  The OCT evaluation was improved in the left eye compared to December.

## 2019-08-28 MED FILL — TROKENDI XR 50 MG CAPSULE: 50 | 90 days supply | Qty: 270 | Fill #1

## 2019-08-28 MED FILL — LEVOTHYROXINE SODIUM 75 MCG: 75 | 90 days supply | Qty: 90 | Fill #1

## 2019-08-29 ENCOUNTER — Ambulatory Visit (INDEPENDENT_AMBULATORY_CARE_PROVIDER_SITE_OTHER): Payer: 59 | Admitting: Family Medicine

## 2019-08-31 DIAGNOSIS — Z20822 Contact with and (suspected) exposure to covid-19: Secondary | ICD-10-CM | POA: Diagnosis not present

## 2019-09-04 ENCOUNTER — Ambulatory Visit: Payer: 59 | Admitting: Neurology

## 2019-09-04 ENCOUNTER — Encounter: Payer: Self-pay | Admitting: Neurology

## 2019-09-04 VITALS — BP 116/72 | HR 60 | Ht 70.0 in | Wt 283.5 lb

## 2019-09-04 DIAGNOSIS — G932 Benign intracranial hypertension: Secondary | ICD-10-CM

## 2019-09-04 DIAGNOSIS — R55 Syncope and collapse: Secondary | ICD-10-CM

## 2019-09-04 NOTE — Progress Notes (Signed)
Reason for visit: Pseudotumor cerebri, history of syncope  Zebediah Beezley is an 54 y.o. male  History of present illness:  Mr. Dubray is a 54 year old left-handed black male with a history of pseudotumor cerebri. The patient is overweight, he is engaged in a dietary program, he has lost some weight. He has had regular follow-up with his ophthalmologist, they have noted improvement and normalization of the right disc, minimal changes on the left. He is on Trokendi, he tolerates this well. He has not had any further episodes of syncope associated with laughing episodes. He denies any double vision or visual field changes. He is engaged in regular exercise currently. He returns for an evaluation.  Past Medical History:  Diagnosis Date  . Hypothyroidism   . Morbid obesity ([place])   . OSA on CPAP   . Other specified disorders of thyroid   . Papilledema   . Pseudotumor cerebri   . Sickle cell trait ([place])   . Syncope and collapse 02/06/2016    Past Surgical History:  Procedure Laterality Date  . CYST EXCISION  2001   Thyglossal duct  . TONSILLECTOMY  1980    Family History  Problem Relation Age of Onset  . Hypertension Mother   . Heart disease Mother   . Kidney disease Mother   . Hyperlipidemia Mother   . Sleep apnea Mother   . Obesity Mother   . Diabetes Father   . Hypertension Father   . Hyperlipidemia Father   . Heart disease Father   . Kidney disease Father   . Obesity Father     Social history:  reports that he has never smoked. He has never used smokeless tobacco. He reports current alcohol use. He reports that he does not use drugs.    Allergies  Allergen Reactions  . Valtrex [Valacyclovir Hcl]     Medications:  Prior to Admission medications   Medication Sig Start Date End Date Taking? Authorizing Provider  levothyroxine (SYNTHROID, LEVOTHROID) 75 MCG tablet Take 75 mcg by mouth daily. 10/12/16   [provider]  Multiple Vitamin (MULTIVITAMIN) tablet  Take 1 tablet by mouth daily.    [provider]  Topiramate ER (TROKENDI XR) 50 MG CP24 Take 150 mg by mouth at bedtime. 06/05/19   Lomax, Amy, NP  Vitamin D, Ergocalciferol, (DRISDOL) 1.25 MG (50000 UNIT) CAPS capsule Take 1 capsule (50,000 Units total) by mouth every 7 (seven) days. 08/08/19   Opalski, Neoma Laming, DO    ROS:  Out of a complete 14 system review of symptoms, the patient complains only of the following symptoms, and all other reviewed systems are negative.  History of syncope  Blood pressure 116/72, pulse 60, height 5\' 10"  (1.778 m), weight 283 lb 8 oz (128.6 kg).  Physical Exam  General: The patient is alert and cooperative at the time of the examination. The patient is moderately obese.  Skin: No significant peripheral edema is noted.   Neurologic Exam  Mental status: The patient is alert and oriented x 3 at the time of the examination. The patient has apparent normal recent and remote memory, with an apparently normal attention span and concentration ability.   Cranial nerves: Facial symmetry is present. Speech is normal, no aphasia or dysarthria is noted. Extraocular movements are full. Visual fields are full. Pupils are equal, round, and reactive to light. Discs appear to be flat bilaterally. No venous pulsations are seen.  Motor: The patient has good strength in all 4 extremities.  Sensory examination: Soft touch sensation is symmetric on the face, arms, and legs.  Coordination: The patient has good finger-nose-finger and heel-to-shin bilaterally.  Gait and station: The patient has a normal gait. Tandem gait is normal. Romberg is negative. No drift is seen.  Reflexes: Deep tendon reflexes are symmetric.   Assessment/Plan:  1. Pseudotumor cerebri, improving  2. History of syncope  The patient has not had any further episodes of syncope, his dietary program is allowing him to lose some weight. He is improving with his ophthalmologic evaluations. He  will continue on the Trokendi for now, he will follow-up here in 6 months.   Jill Alexanders MD 09/04/2019 8:19 AM  [facility] [address] [place], [place] [address]  Phone [phone] Fax [phone]

## 2019-09-16 DIAGNOSIS — D1721 Benign lipomatous neoplasm of skin and subcutaneous tissue of right arm: Secondary | ICD-10-CM | POA: Diagnosis not present

## 2019-09-20 ENCOUNTER — Other Ambulatory Visit: Payer: Self-pay

## 2019-09-20 ENCOUNTER — Encounter (INDEPENDENT_AMBULATORY_CARE_PROVIDER_SITE_OTHER): Payer: Self-pay | Admitting: Family Medicine

## 2019-09-20 ENCOUNTER — Ambulatory Visit (INDEPENDENT_AMBULATORY_CARE_PROVIDER_SITE_OTHER): Payer: 59 | Admitting: Family Medicine

## 2019-09-20 VITALS — BP 111/71 | HR 64 | Temp 97.9°F | Ht 70.0 in | Wt 279.0 lb

## 2019-09-20 DIAGNOSIS — Z6841 Body Mass Index (BMI) 40.0 and over, adult: Secondary | ICD-10-CM

## 2019-09-20 DIAGNOSIS — E559 Vitamin D deficiency, unspecified: Secondary | ICD-10-CM | POA: Diagnosis not present

## 2019-09-20 DIAGNOSIS — Z9189 Other specified personal risk factors, not elsewhere classified: Secondary | ICD-10-CM

## 2019-09-20 MED ORDER — VITAMIN D (ERGOCALCIFEROL) 1.25 MG (50000 UNIT) PO CAPS: 50000.0000 [IU] | ORAL_CAPSULE | ORAL | 0 refills | Status: DC

## 2019-09-20 MED FILL — VIT D2 1.25 MG (50,000 UNIT: 1.25 MG | 28 days supply | Qty: 4 | Fill #0

## 2019-09-25 DIAGNOSIS — Z6841 Body Mass Index (BMI) 40.0 and over, adult: Secondary | ICD-10-CM | POA: Insufficient documentation

## 2019-09-25 DIAGNOSIS — E559 Vitamin D deficiency, unspecified: Secondary | ICD-10-CM | POA: Insufficient documentation

## 2019-09-25 NOTE — Progress Notes (Signed)
Chief Complaint:   OBESITY Carl Knox is here to discuss his progress with his obesity treatment plan along with follow-up of his obesity related diagnoses. Carl Knox is on the Category 3 Plan and states he is following his eating plan approximately 90% of the time. Carl Knox states he is doing calisthenics for 15 minutes 3 times per week.  Today's visit was #: 7 Starting weight: 290 lbs Starting date: 04/26/2019 Today's weight: 279 lbs Today's date: 09/20/2019 Total lbs lost to date: 11 Total lbs lost since last in-office visit: 0  Interim History: Carl Knox has done well with maintaining his weight. He is working long hours and changing shifts, so sleep hasn't been ideal. He is deviating somewhat from his plan and would like more options.  Subjective:   1. Vitamin D deficiency Carl Knox ran out of his Vit D prescription, and he changed to OTC Vit D. His Vit D level is not yet at goal.  2. At risk for impaired metabolic function Carl Knox is at increased risk for impaired metabolic function due to decreased sleep quantity and quality.  Assessment/Plan:   1. Vitamin D deficiency Low Vitamin D level contributes to fatigue and are associated with obesity, breast, and colon cancer. We will refill prescription Vitamin D for 2 months, and we will recheck labs at that time. Carl Knox will follow-up for routine testing of Vitamin D, at least 2-3 times per year to avoid over-replacement.  - Vitamin D, Ergocalciferol, (DRISDOL) 1.25 MG (50000 UNIT) CAPS capsule; Take 1 capsule (50,000 Units total) by mouth every 7 (seven) days.  Dispense: 4 capsule; Refill: 0  2. At risk for impaired metabolic function Carl Knox was given approximately 15 minutes of impaired  metabolic function prevention counseling today. We discussed intensive lifestyle modifications today with an emphasis on specific nutrition and exercise instructions and strategies.   Repetitive spaced learning was employed today to elicit superior memory formation  and behavioral change.  3. Class 3 severe obesity with serious comorbidity and body mass index (BMI) of 40.0 to 44.9 in adult, unspecified obesity type [facility]) Carl Knox is currently in the action stage of change. As such, his goal is to continue with weight loss efforts. He has agreed to the Category 3 Plan with breakfast options.   Exercise goals: As is.  Behavioral modification strategies: no skipping meals.  Carl Knox has agreed to follow-up with our clinic in 8 weeks. He was informed of the importance of frequent follow-up visits to maximize his success with intensive lifestyle modifications for his multiple health conditions.   Objective:   Blood pressure 111/71, pulse 64, temperature 97.9 F (36.6 C), height 5\' 10"  (1.778 m), weight 279 lb (126.6 kg), SpO2 99 %. Body mass index is 40.03 kg/m.  General: Cooperative, alert, well developed, in no acute distress. HEENT: Conjunctivae and lids unremarkable. Cardiovascular: Regular rhythm.  Lungs: Normal work of breathing. Neurologic: No focal deficits.   Lab Results  Component Value Date   CREATININE 1.24 04/26/2019   BUN 19 04/26/2019   NA 143 04/26/2019   K 4.6 04/26/2019   CL 108 (H) 04/26/2019   CO2 24 04/26/2019   Lab Results  Component Value Date   ALT 21 04/26/2019   AST 25 04/26/2019   ALKPHOS 111 04/26/2019   BILITOT 0.5 04/26/2019   Lab Results  Component Value Date   HGBA1C 5.4 04/26/2019   Lab Results  Component Value Date   INSULIN 6.1 04/26/2019   Lab Results  Component Value Date  TSH 3.220 04/26/2019   Lab Results  Component Value Date   CHOL 117 04/26/2019   HDL 43 04/26/2019   LDLCALC 57 04/26/2019   TRIG 87 04/26/2019   Lab Results  Component Value Date   WBC 4.0 04/26/2019   HGB 14.8 04/26/2019   HCT 44.3 04/26/2019   MCV 79 04/26/2019   PLT 256 04/26/2019   No results found for: IRON, TIBC, FERRITIN  Attestation Statements:   Reviewed by clinician on day of visit: allergies,  medications, problem list, medical history, surgical history, family history, social history, and previous encounter notes.   I, Trixie Dredge, am acting as transcriptionist for Dennard Nip, MD.  I have reviewed the above documentation for accuracy and completeness, and I agree with the above. -  Dennard Nip, MD

## 2019-10-15 ENCOUNTER — Encounter (INDEPENDENT_AMBULATORY_CARE_PROVIDER_SITE_OTHER): Payer: Self-pay

## 2019-11-08 DIAGNOSIS — E039 Hypothyroidism, unspecified: Secondary | ICD-10-CM | POA: Diagnosis not present

## 2019-11-08 DIAGNOSIS — E236 Other disorders of pituitary gland: Secondary | ICD-10-CM | POA: Diagnosis not present

## 2019-11-08 DIAGNOSIS — R6889 Other general symptoms and signs: Secondary | ICD-10-CM | POA: Diagnosis not present

## 2019-11-08 DIAGNOSIS — Z9889 Other specified postprocedural states: Secondary | ICD-10-CM | POA: Diagnosis not present

## 2019-11-08 DIAGNOSIS — E669 Obesity, unspecified: Secondary | ICD-10-CM | POA: Diagnosis not present

## 2019-11-13 ENCOUNTER — Ambulatory Visit (INDEPENDENT_AMBULATORY_CARE_PROVIDER_SITE_OTHER): Payer: 59 | Admitting: Family Medicine

## 2019-11-26 MED FILL — LEVOTHYROXINE 75 MCG TABLET: 75 | 90 days supply | Qty: 90 | Fill #2

## 2019-12-10 MED FILL — TROKENDI XR 50 MG CAPSULE: 50 | 90 days supply | Qty: 270 | Fill #2

## 2019-12-24 ENCOUNTER — Ambulatory Visit (INDEPENDENT_AMBULATORY_CARE_PROVIDER_SITE_OTHER): Payer: 59 | Admitting: Family Medicine

## 2019-12-24 ENCOUNTER — Other Ambulatory Visit (INDEPENDENT_AMBULATORY_CARE_PROVIDER_SITE_OTHER): Payer: Self-pay | Admitting: Family Medicine

## 2019-12-24 ENCOUNTER — Other Ambulatory Visit: Payer: Self-pay

## 2019-12-24 ENCOUNTER — Encounter (INDEPENDENT_AMBULATORY_CARE_PROVIDER_SITE_OTHER): Payer: Self-pay | Admitting: Family Medicine

## 2019-12-24 VITALS — BP 113/69 | HR 64 | Temp 98.2°F | Ht 70.0 in | Wt 282.0 lb

## 2019-12-24 DIAGNOSIS — Z6841 Body Mass Index (BMI) 40.0 and over, adult: Secondary | ICD-10-CM | POA: Diagnosis not present

## 2019-12-24 DIAGNOSIS — E559 Vitamin D deficiency, unspecified: Secondary | ICD-10-CM | POA: Diagnosis not present

## 2019-12-24 DIAGNOSIS — E038 Other specified hypothyroidism: Secondary | ICD-10-CM | POA: Diagnosis not present

## 2019-12-24 DIAGNOSIS — G4709 Other insomnia: Secondary | ICD-10-CM | POA: Diagnosis not present

## 2019-12-24 MED ORDER — VITAMIN D (ERGOCALCIFEROL) 1.25 MG (50000 UNIT) PO CAPS: 50000.0000 [IU] | ORAL_CAPSULE | ORAL | 0 refills | Status: DC

## 2019-12-24 MED FILL — VIT D2 1.25 MG (50,000 UNIT: 1.25 MG | 84 days supply | Qty: 12 | Fill #0

## 2019-12-25 LAB — COMPREHENSIVE METABOLIC PANEL
ALT: 22 IU/L (ref 0–44)
AST: 27 IU/L (ref 0–40)
Albumin/Globulin Ratio: 1.4 (ref 1.2–2.2)
Albumin: 4.3 g/dL (ref 3.8–4.9)
Alkaline Phosphatase: 112 IU/L (ref 44–121)
BUN/Creatinine Ratio: 15 (ref 9–20)
BUN: 19 mg/dL (ref 6–24)
Bilirubin Total: 0.4 mg/dL (ref 0.0–1.2)
CO2: 23 mmol/L (ref 20–29)
Calcium: 9.6 mg/dL (ref 8.7–10.2)
Chloride: 106 mmol/L (ref 96–106)
Creatinine, Ser: 1.28 mg/dL — ABNORMAL HIGH (ref 0.76–1.27)
GFR calc Af Amer: 74 mL/min/{1.73_m2} (ref >59)
GFR calc non Af Amer: 64 mL/min/{1.73_m2} (ref >59)
Globulin, Total: 3.1 g/dL (ref 1.5–4.5)
Glucose: 102 mg/dL — ABNORMAL HIGH (ref 65–99)
Potassium: 4.6 mmol/L (ref 3.5–5.2)
Sodium: 141 mmol/L (ref 134–144)
Total Protein: 7.4 g/dL (ref 6.0–8.5)

## 2019-12-25 LAB — INSULIN, RANDOM: INSULIN: 11.4 u[IU]/mL (ref 2.6–24.9)

## 2019-12-25 LAB — T4, FREE: Free T4: 1.15 ng/dL (ref 0.82–1.77)

## 2019-12-25 LAB — TSH: TSH: 4.4 u[IU]/mL (ref 0.450–4.500)

## 2019-12-25 LAB — HEMOGLOBIN A1C
Est. average glucose Bld gHb Est-mCnc: 108 mg/dL
Hgb A1c MFr Bld: 5.4 % (ref 4.8–5.6)

## 2019-12-25 LAB — VITAMIN D 25 HYDROXY (VIT D DEFICIENCY, FRACTURES): Vit D, 25-Hydroxy: 34.8 ng/mL (ref 30.0–100.0)

## 2019-12-25 LAB — T3: T3, Total: 78 ng/dL (ref 71–180)

## 2019-12-25 NOTE — Progress Notes (Signed)
Chief Complaint:   OBESITY Carl Knox is here to discuss his progress with his obesity treatment plan along with follow-up of his obesity related diagnoses. Carl Knox is on the Category 3 Plan with breakfast options and states he is following his eating plan approximately 80% of the time. Carl Knox states he is doing calisthenics (push ups and sit ups) for 10-15 minutes 3 times per week.  Today's visit was #: 8 Starting weight: 290 lbs Starting date: 04/26/2019 Today's weight: 282 lbs Today's date: 12/24/2019 Total lbs lost to date: 8 Total lbs lost since last in-office visit: 0  Interim History: Carl Knox has been gaining weight in the last 3 months. He is working very early shifts, and he is not sleeping well. He is trying to do portion control and smarter choices.  Subjective:   1. Vitamin D deficiency Carl Knox is stable on Vit D, and he is due for labs.   2. Other insomnia Carl Knox is working very early in the morning. He is only sleeping approximately 3 hours per night and naps frequently in the evening.  3. Other specified hypothyroidism Carl Knox is stable on Synthroid, and he denies palpitations. He is due to have labs.  Assessment/Plan:   1. Vitamin D deficiency Low Vitamin D level contributes to fatigue and are associated with obesity, breast, and colon cancer. We will check labs today, and we will refill prescription Vitamin D for 90 days with no refills. Carl Knox will follow-up for routine testing of Vitamin D, at least 2-3 times per year to avoid over-replacement.  - VITAMIN D 25 Hydroxy (Vit-D Deficiency, Fractures) - Vitamin D, Ergocalciferol, (DRISDOL) 1.25 MG (50000 UNIT) CAPS capsule; Take 1 capsule (50,000 Units total) by mouth every 7 (seven) days.  Dispense: 12 capsule; Refill: 0  2. Other insomnia The problem of recurrent insomnia was discussed. Orders and follow up as documented in patient record. Counseling: Intensive lifestyle modifications are the first line treatment for this  issue. We discussed several lifestyle modifications today. Carl Knox was encouraged to increase sleep, and strategies were discussed. He will continue to work on diet, exercise and weight loss efforts.   - Comprehensive metabolic panel  3. Other specified hypothyroidism Patient with long-standing hypothyroidism, on levothyroxine therapy. Carl Knox appears euthyroid. We will check labs today. Orders and follow up as documented in patient record.  - Insulin, random - Hemoglobin A1c - TSH - T4, free - T3  4. Class 3 severe obesity with serious comorbidity and body mass index (BMI) of 40.0 to 44.9 in adult, unspecified obesity type Carl Knox) Carl Knox is currently in the action stage of change. As such, his goal is to continue with weight loss efforts. He has agreed to the Category 3 Plan.   Exercise goals: As is.  Behavioral modification strategies: increasing lean protein intake.  Carl Knox has agreed to follow-up with our clinic in 3 months. He was informed of the importance of frequent follow-up visits to maximize his success with intensive lifestyle modifications for his multiple health conditions.   Carl Knox was informed we would discuss his lab results at his next visit unless there is a critical issue that needs to be addressed sooner. Carl Knox agreed to keep his next visit at the agreed upon time to discuss these results.  Objective:   Blood pressure 113/69, pulse 64, temperature 98.2 F (36.8 C), height 5\' 10"  (1.778 m), weight 282 lb (127.9 kg), SpO2 95 %. Body mass index is 40.46 kg/m.  General: Cooperative, alert, well developed, in no acute  distress. HEENT: Conjunctivae and lids unremarkable. Cardiovascular: Regular rhythm.  Lungs: Normal work of breathing. Neurologic: No focal deficits.   Lab Results  Component Value Date   CREATININE 1.28 (H) 12/24/2019   BUN 19 12/24/2019   NA 141 12/24/2019   K 4.6 12/24/2019   CL 106 12/24/2019   CO2 23 12/24/2019   Lab Results  Component Value  Date   ALT 22 12/24/2019   AST 27 12/24/2019   ALKPHOS 112 12/24/2019   BILITOT 0.4 12/24/2019   Lab Results  Component Value Date   HGBA1C 5.4 12/24/2019   HGBA1C 5.4 04/26/2019   Lab Results  Component Value Date   INSULIN 11.4 12/24/2019   INSULIN 6.1 04/26/2019   Lab Results  Component Value Date   TSH 4.400 12/24/2019   Lab Results  Component Value Date   CHOL 117 04/26/2019   HDL 43 04/26/2019   LDLCALC 57 04/26/2019   TRIG 87 04/26/2019   Lab Results  Component Value Date   WBC 4.0 04/26/2019   HGB 14.8 04/26/2019   HCT 44.3 04/26/2019   MCV 79 04/26/2019   PLT 256 04/26/2019   No results found for: IRON, TIBC, FERRITIN  Attestation Statements:   Reviewed by clinician on day of visit: allergies, medications, problem list, medical history, surgical history, family history, social history, and previous encounter notes.   I, Trixie Dredge, am acting as transcriptionist for Dennard Nip, MD.  I have reviewed the above documentation for accuracy and completeness, and I agree with the above. -  Dennard Nip, MD

## 2020-01-27 ENCOUNTER — Other Ambulatory Visit (HOSPITAL_COMMUNITY): Payer: Self-pay | Admitting: Family Medicine

## 2020-01-27 DIAGNOSIS — E119 Type 2 diabetes mellitus without complications: Secondary | ICD-10-CM | POA: Diagnosis not present

## 2020-01-27 DIAGNOSIS — M549 Dorsalgia, unspecified: Secondary | ICD-10-CM | POA: Diagnosis not present

## 2020-01-27 DIAGNOSIS — E669 Obesity, unspecified: Secondary | ICD-10-CM | POA: Diagnosis not present

## 2020-01-28 ENCOUNTER — Telehealth: Payer: Self-pay | Admitting: Neurology

## 2020-01-28 MED FILL — traMADol HCL 50 MG TABS: 50 | 7 days supply | Qty: 21 | Fill #0

## 2020-01-28 NOTE — Telephone Encounter (Signed)
Pt.'s wife Carlyon Shadow is on [place]. She states husband is having severe pain in back to right side of buttocks. She asks to receive a call from RN to discuss this.

## 2020-01-29 ENCOUNTER — Other Ambulatory Visit (HOSPITAL_COMMUNITY): Payer: Self-pay | Admitting: Internal Medicine

## 2020-01-29 DIAGNOSIS — M5441 Lumbago with sciatica, right side: Secondary | ICD-10-CM | POA: Diagnosis not present

## 2020-01-29 MED FILL — tiZANidine HCL 4 MG TABS: 4 | 7 days supply | Qty: 21 | Fill #0

## 2020-01-29 NOTE — Telephone Encounter (Signed)
Called and spoke to wife, she said he is being seen by PCP today.  However she wants him to be seen by Dr. Jannifer Franklin who found his disc problem.  She said his pain is different than it has been.  She is going to call me if his PCP orders any testing prior to appointment 02/20/20.

## 2020-02-12 ENCOUNTER — Other Ambulatory Visit (HOSPITAL_COMMUNITY): Payer: Self-pay | Admitting: Internal Medicine

## 2020-02-12 DIAGNOSIS — M5441 Lumbago with sciatica, right side: Secondary | ICD-10-CM | POA: Diagnosis not present

## 2020-02-12 MED FILL — predniSONE 10 MG TABS: 10 | 6 days supply | Qty: 21 | Fill #0

## 2020-02-13 ENCOUNTER — Other Ambulatory Visit: Payer: Self-pay | Admitting: Internal Medicine

## 2020-02-13 DIAGNOSIS — M5441 Lumbago with sciatica, right side: Secondary | ICD-10-CM

## 2020-02-18 ENCOUNTER — Encounter: Payer: Self-pay | Admitting: Neurology

## 2020-02-18 ENCOUNTER — Ambulatory Visit: Payer: 59 | Admitting: Neurology

## 2020-02-18 VITALS — BP 111/70 | HR 68 | Ht 71.0 in | Wt 290.0 lb

## 2020-02-18 DIAGNOSIS — M545 Low back pain, unspecified: Secondary | ICD-10-CM

## 2020-02-18 DIAGNOSIS — G932 Benign intracranial hypertension: Secondary | ICD-10-CM | POA: Diagnosis not present

## 2020-02-18 NOTE — Progress Notes (Signed)
Reason for visit: Pseudotumor cerebri, back pain  Carl Knox is an 55 y.o. male  History of present illness:  Carl Knox is a 55 year old left-handed black male with a history of obesity and pseudotumor cerebri.  The patient was having syncopal events that occurred with prolonged laughing, this has not recurred.  He has tried to lose some weight, he is followed through ophthalmology.  There has been some gradual improvement of his papilledema.  The patient however over the last month has developed recurrence of back pain that mainly is on the right side affecting the buttocks.  He feels better when he is up on his feet moving around, the pain is worse when he has been inactive for prolonged period time and then starts moving.  He recently went through a pack of prednisone which helped some, Motrin also helped but he was concerned about kidney function.  He has been on some Zanaflex without much benefit.  He was given Ultram for pain through urgent care.  The patient reports no weakness of the extremities.  He has no numbness.  He comes to this office for an evaluation.  Past Medical History:  Diagnosis Date  . Hypothyroidism   . Morbid obesity ([place])   . OSA on CPAP   . Other specified disorders of thyroid   . Papilledema   . Pseudotumor cerebri   . Sickle cell trait ([place])   . Syncope and collapse 02/06/2016    Past Surgical History:  Procedure Laterality Date  . CYST EXCISION  2001   Thyglossal duct  . TONSILLECTOMY  1980    Family History  Problem Relation Age of Onset  . Hypertension Mother   . Heart disease Mother   . Kidney disease Mother   . Hyperlipidemia Mother   . Sleep apnea Mother   . Obesity Mother   . Diabetes Father   . Hypertension Father   . Hyperlipidemia Father   . Heart disease Father   . Kidney disease Father   . Obesity Father     Social history:  reports that he has never smoked. He has never used smokeless tobacco. He reports current alcohol  use. He reports that he does not use drugs.    Allergies  Allergen Reactions  . Valtrex [Valacyclovir Hcl]     Medications:  Prior to Admission medications   Medication Sig Start Date End Date Taking? Authorizing Provider  levothyroxine (SYNTHROID, LEVOTHROID) 75 MCG tablet Take 75 mcg by mouth daily. 10/12/16  Yes [provider]  Multiple Vitamin (MULTIVITAMIN) tablet Take 1 tablet by mouth daily.   Yes [provider]  Topiramate ER (TROKENDI XR) 50 MG CP24 Take 150 mg by mouth at bedtime. 06/05/19  Yes Lomax, Amy, NP  Vitamin D, Ergocalciferol, (DRISDOL) 1.25 MG (50000 UNIT) CAPS capsule Take 1 capsule (50,000 Units total) by mouth every 7 (seven) days. 12/24/19   Dennard Nip D, MD    ROS:  Out of a complete 14 system review of symptoms, the patient complains only of the following symptoms, and all other reviewed systems are negative.  Back pain  Blood pressure 111/70, pulse 68, height 5\' 11"  (1.803 m), weight 290 lb (131.5 kg).  Physical Exam  General: The patient is alert and cooperative at the time of the examination.  The patient is markedly obese.  Neuromuscular: The patient has fairly good range move the low back.  Skin: No significant peripheral edema is noted.   Neurologic Exam  Mental  status: The patient is alert and oriented x 3 at the time of the examination. The patient has apparent normal recent and remote memory, with an apparently normal attention span and concentration ability.   Cranial nerves: Facial symmetry is present. Speech is normal, no aphasia or dysarthria is noted. Extraocular movements are full. Visual fields are full.  Pupils are equal, round, and reactive to light.  Discs are flat bilaterally.  Motor: The patient has good strength in all 4 extremities.  The patient is able to walk on heels and the toes bilaterally.  Sensory examination: Soft touch sensation is symmetric on the face, arms, and legs.  Coordination: The  patient has good finger-nose-finger and heel-to-shin bilaterally.  Gait and station: The patient has a normal gait. Tandem gait is normal. Romberg is negative. No drift is seen.  Reflexes: Deep tendon reflexes are symmetric.   MRI lumbar 12/26/18:  IMPRESSION:   MRI lumbar spine (with and without) demonstrating: - At L1-2: mild disc bulging with no spinal stenosis or foraminal narrowing.   * MRI scan images were reviewed online. I agree with the written report.    Assessment/Plan:  1.  Pseudotumor cerebri  2.  Low back pain  The patient has had recurrence of back pain with pain into the right buttock area.  I suspect that the pain is related to facet arthritis, I do not believe that the patient has radicular symptoms.  The patient will be set up for physical therapy.  If the pain recurs after cessation of the prednisone Dosepak, he will call me and I will give him another short course of prednisone.  He will follow-up here otherwise in 6 months.  Jill Alexanders MD 02/18/2020 2:12 PM  [facility] [address] [place], [place] [address]  Phone [phone] Fax [phone]

## 2020-02-20 ENCOUNTER — Ambulatory Visit: Payer: Self-pay | Admitting: Neurology

## 2020-02-22 DIAGNOSIS — G932 Benign intracranial hypertension: Secondary | ICD-10-CM | POA: Diagnosis not present

## 2020-02-25 ENCOUNTER — Other Ambulatory Visit (HOSPITAL_COMMUNITY): Payer: Self-pay | Admitting: Specialist

## 2020-02-25 DIAGNOSIS — M5459 Other low back pain: Secondary | ICD-10-CM | POA: Diagnosis not present

## 2020-02-25 DIAGNOSIS — H0510 Unspecified chronic inflammatory disorders of orbit: Secondary | ICD-10-CM | POA: Diagnosis not present

## 2020-02-25 DIAGNOSIS — D573 Sickle-cell trait: Secondary | ICD-10-CM | POA: Insufficient documentation

## 2020-02-25 DIAGNOSIS — M545 Low back pain, unspecified: Secondary | ICD-10-CM | POA: Insufficient documentation

## 2020-02-25 DIAGNOSIS — H05119 Granuloma of unspecified orbit: Secondary | ICD-10-CM | POA: Insufficient documentation

## 2020-02-25 MED FILL — GABAPENTIN 300 MG CAPSULE: 300 | 30 days supply | Qty: 30 | Fill #0

## 2020-02-26 ENCOUNTER — Other Ambulatory Visit: Payer: Self-pay

## 2020-02-26 ENCOUNTER — Ambulatory Visit: Payer: BC Managed Care – PPO | Attending: Neurology | Admitting: Physical Therapy

## 2020-02-26 ENCOUNTER — Encounter: Payer: Self-pay | Admitting: Physical Therapy

## 2020-02-26 ENCOUNTER — Other Ambulatory Visit: Payer: Self-pay | Admitting: Specialist

## 2020-02-26 DIAGNOSIS — M545 Low back pain, unspecified: Secondary | ICD-10-CM | POA: Insufficient documentation

## 2020-02-26 DIAGNOSIS — M5459 Other low back pain: Secondary | ICD-10-CM

## 2020-02-27 NOTE — Therapy (Signed)
[place], [place], [address] Phone: [phone]   Fax:  [phone]  Physical Therapy Evaluation  Patient Details  Name: Carl Knox MRN: [id number] Date of Birth: 01-Feb-1965 Referring Provider (PT): Margette Fast, MD   Encounter Date: 02/26/2020   PT End of Session - 02/26/20 1454    Visit Number 1    Number of Visits 10    Date for PT Re-Evaluation 04/08/20    Authorization Type recheck FOTO by visit 6    PT Start Time 1455    PT Stop Time 1546    PT Time Calculation (min) 51 min    Activity Tolerance Patient tolerated treatment well    Behavior During Therapy [facility] for tasks assessed/performed           Past Medical History:  Diagnosis Date  . Hypothyroidism   . Morbid obesity ([place])   . OSA on CPAP   . Other specified disorders of thyroid   . Papilledema   . Pseudotumor cerebri   . Sickle cell trait ([place])   . Syncope and collapse 02/06/2016    Past Surgical History:  Procedure Laterality Date  . CYST EXCISION  2001   Thyglossal duct  . TONSILLECTOMY  1980    There were no vitals filed for this visit.    Subjective Assessment - 02/27/20 1331    Subjective Pt. is a 55 y/o male referred to PT for c/o right-sided LBP. He reports onset of symptoms including right-sided LBP with radiating pain into right buttock region around the week of this past Christmas-no mechanism of injury noted. Pain is worse after prolonged sitting particularly with transfers/sit<>stand after sitting. He works as a Furniture conservator/restorer with prolonged standing at work as well as lifting activities required but no significant exacerbation of symptoms (with standing/walking or lifting motions). He reports has had chronic issues with numbness in his "middle toe" on the right side otherwise no parasthesias noted and no pain distal to buttock region. No bowel/bladder changes or saddle parasthesias noted and no history of cancer or  unexplained weight loss. He is scheduled for lumbar MRI next week. He had a previous episode of LBP in 202 following spinal tap procedure undergone for issues with optic nerve pressure which had resolved and with MRI at the time (-) for neurologic compression. Justus Memory is also worse in AM with difficulty noted with lifting legs for donning socks.    Pertinent History sickle cell trait, right ankle injury, syncope (last episode 2020), obesity, pseudotumor cerebri, see PMH    Limitations Sitting    Diagnostic tests new MRI pending but not performed as of initial evaluation    Patient Stated Goals Get back better    Currently in Pain? Yes    Pain Score 4     Pain Location Back    Pain Orientation Right;Lower    Pain Descriptors / Indicators Shooting    Pain Type Acute pain    Pain Radiating Towards right buttock region    Pain Onset More than a month ago    Pain Frequency Intermittent    Aggravating Factors  prolonged sitting and transfers after sitting    Pain Relieving Factors movement    Effect of Pain on Daily Activities limits positional tolerance              [facility] PT Assessment - 02/27/20 0001      Assessment   Medical Diagnosis Acute right-sided LBP    Referring Provider (  PT) Margette Fast, MD    Onset Date/Surgical Date --   the week of Christmas 2021   Prior Therapy none for back      Precautions   Precautions None      Restrictions   Weight Bearing Restrictions No      Balance Screen   Has the patient fallen in the past 6 months No      Prior Function   Level of Independence Independent with basic ADLs;Independent with community mobility without device      Cognition   Overall Cognitive Status Within Functional Limits for tasks assessed      Observation/Other Assessments   Focus on Therapeutic Outcomes (FOTO)  54% function      Sensation   Light Touch Appears Intact      Deep Tendon Reflexes   DTR Assessment Site Patella;Achilles    Patella DTR 1+     Achilles DTR 2+      AROM   Overall AROM Comments For lumbar AROM no directional preference noted and no symptom reproduction noted with overpressure or assessment of quadrant ROM, hip flexion limited to 90 deg bilat. otherwise hip AROM grossly WFL bilat., hip PROM for flexion limited with firm, capsular endfeel at 90 deg bilat.    Right Hip Flexion 90    Lumbar Flexion 80    Lumbar Extension 30    Lumbar - Right Side Bend 24    Lumbar - Left Side Bend 30    Lumbar - Right Rotation 50%    Lumbar - Left Rotation 60%      Strength   Strength Assessment Site Hip;Knee;Ankle    Right/Left Hip Right;Left    Right Hip Flexion 5/5    Right Hip Extension 4/5    Right Hip External Rotation  5/5    Right Hip Internal Rotation 5/5    Right Hip ABduction 4+/5    Left Hip Flexion 5/5    Left Hip Extension 4/5    Left Hip External Rotation 5/5    Left Hip Internal Rotation 5/5    Left Hip ABduction 5/5    Right/Left Knee Right;Left    Right Knee Flexion 5/5    Right Knee Extension 5/5    Left Knee Flexion 5/5    Left Knee Extension 5/5    Right/Left Ankle Right;Left    Right Ankle Dorsiflexion 4+/5    Right Ankle Inversion 4+/5    Right Ankle Eversion 4+/5    Left Ankle Dorsiflexion 5/5    Left Ankle Inversion 5/5    Left Ankle Eversion 5/5      Flexibility   Soft Tissue Assessment /Muscle Length --   SLR 60 deg bilat. with hamstring tightness     Palpation   Palpation comment Tightness and TTP right glut and piriformis region, hypomobility for lower lumbar PAs with soreness but no reproduction concordant pain      Special Tests   Other special tests SLR (-), FADIR (-), FABER (-), SI compression and distraction (-)                      Objective measurements completed on examination: See above findings.       [facility] PT Treatment/Exercise - 02/27/20 0001      Exercises   Exercises --   HEP handout review                 PT Education - 02/27/20  1346  Education Details HEP, POC, potential symptom etiology/spine anatomy    Person(s) Educated Patient    Methods Explanation;Verbal cues;Demonstration;Handout    Comprehension Verbalized understanding               PT Long Term Goals - 02/27/20 1355      PT LONG TERM GOAL #1   Title Independent with HEP    Baseline needs HEP    Time 6    Period Weeks    Status New    Target Date 04/09/20      PT LONG TERM GOAL #2   Title Improve FOTO outcome measure score to 78% or greater functional status    Baseline 54%    Time 6    Period Weeks    Status New    Target Date 04/08/20      PT LONG TERM GOAL #3   Title Increase hip flexion AROM 5-10 deg bilat. to improve ability to donn shoes    Baseline 90 deg    Time 6    Period Weeks    Status New    Target Date 04/08/20      PT LONG TERM GOAL #4   Title Tolerate seated positions and sit<>stand with LBP symptoms/pain decreased 60% or greater from baseline status    Time 6    Period Weeks    Status New    Target Date 04/08/20      PT LONG TERM GOAL #5   Title Increase bilat. hip extension and right hip abduction strength at least 1/2 MMT grade from baseline status to improve ability for transfers and lifting activities for work    Baseline hip extension 4/5 bilat., right hip abd 4+/5    Time 6    Period Weeks    Status New    Target Date 04/08/20                  Plan - 02/27/20 1347    Clinical Impression Statement Pt. presents with 1 1/2 month history of acute onset LBP with radiating pain into right buttock region. During evaluation he had pain in prone position but otherwise no directional preference noted or concordant pain reproduction with trunk ROM. He did present with right ankle weakness but reports history of ankle injury a few years ago so potentially associated with this vs. radicular weakness. No parasthesias noted other than chronic toe numbness of unclear etiology as noted in subjective which was  present prior to symptom onset. Given no issues with walking or standing or trunk extension no findings or symptoms suggestive of stenosis. Differential diagnosis could include discogenic etiology given pain with sitting vs. gluteal region muscular pain or facet-mediated symptoms given pain with lumbar PAs. Pt. pending MRI next week which should inform diagnosis-plan proceed with trial PT to see if symptoms can be improved with conservative tx. measures.    Personal Factors and Comorbidities Comorbidity 3+    Comorbidities see PMH    Examination-Activity Limitations Dressing;Sit    Stability/Clinical Decision Making Evolving/Moderate complexity    Clinical Decision Making Moderate    Rehab Potential Good    PT Frequency --   1-2x/week   PT Duration 6 weeks    PT Treatment/Interventions ADLs/Self Care Home Management;Cryotherapy;Traction;Electrical Stimulation;Moist Heat;Therapeutic exercise;Neuromuscular re-education;Functional mobility training;Therapeutic activities;Taping;Dry needling;Spinal Manipulations;Manual techniques;Patient/family education    PT Next Visit Plan Review patient FOTO report by visit 3, Check response HEP and review/update as needed, check for centralization response with extension in standing (pain  in prone), manual to right glut region, lumbar CPAs/UPAs, potential trial dry needling right piriformis and glut max, trial LAD right hip, stretch gluts, piriformis, ROM/stretches as tolerated    PT Home Exercise Plan Access code: [place] and Agree with Plan of Care Patient           Patient will benefit from skilled therapeutic intervention in order to improve the following deficits and impairments:  Pain,Impaired flexibility,Decreased strength,Increased muscle spasms,Decreased range of motion,Hypomobility  Visit Diagnosis: Acute right-sided low back pain without sciatica     Problem List Patient Active Problem List   Diagnosis Date Noted  . Vitamin D  deficiency 09/25/2019  . Class 3 severe obesity with serious comorbidity and body mass index (BMI) of 40.0 to 44.9 in adult ([place]) 09/25/2019  . Papilledema 07/11/2018  . Obstructive sleep apnea on CPAP 10/12/2016  . Pseudotumor cerebri 07/09/2016  . History of obstructive sleep apnea 07/09/2016  . Syncope and collapse 02/06/2016    Beaulah Dinning, PT, DPT 02/27/20 2:01 PM  [facility] [address] [place], [place], [address] Phone: [phone]   Fax:  [phone]  Name: Rj Pedrosa MRN: [id number] Date of Birth: 12-17-1965

## 2020-02-28 ENCOUNTER — Telehealth: Payer: Self-pay | Admitting: Neurology

## 2020-02-28 ENCOUNTER — Other Ambulatory Visit: Payer: Self-pay

## 2020-02-28 ENCOUNTER — Ambulatory Visit
Admission: RE | Admit: 2020-02-28 | Discharge: 2020-02-28 | Disposition: A | Discharge status: Home / Self Care | Payer: 59 | Source: Ambulatory Visit | Attending: Specialist | Admitting: Specialist

## 2020-02-28 DIAGNOSIS — M5127 Other intervertebral disc displacement, lumbosacral region: Secondary | ICD-10-CM | POA: Diagnosis not present

## 2020-02-28 DIAGNOSIS — M5459 Other low back pain: Secondary | ICD-10-CM

## 2020-02-28 NOTE — Telephone Encounter (Signed)
Returned call.  Spoke to patient's wife.  Had MRI done this morning.  Seen by Ortho on Monday 2/7 who suggested he may have pinched nerve.  Wife is concerned his ICP is high and may need another lumbar puncture.  Seen by opthalmologist on  2/4 and was told the pressure on his optic nerve was elevated at 23 (she thinks).    Aware Dr. Jannifer Franklin is out of the office as well as will need MRI finalized.  Wife is questioning if it is all related to his pseudotumor?

## 2020-02-28 NOTE — Telephone Encounter (Signed)
I called and talk with the wife.  The patient has had an ophthalmologic evaluation that suggested some worsening of the papilledema.  More significantly, the patient had a near syncopal event associated with laughing, this could indicate an increased intracranial pressure.  I have recommended a lumbar puncture, if the patient wishes to have this, the wife will call us back and get this done.  The patient is having a lot of back pain right now and some discomfort down to the right buttock and upper thigh.  There is some potential for right L5 nerve root impingement on a recent MRI of the lumbar spine.  The patient is being followed by Dr. Tonita Cong for this.

## 2020-02-28 NOTE — Telephone Encounter (Signed)
Pt's wife Carlyon Shadow called wanting to speak to the RN regarding the pt's optic nerve pressure the pt has been having. She states pt had an episode the other day due to the pressure and she would like to discuss. Please advise.

## 2020-02-29 ENCOUNTER — Other Ambulatory Visit (HOSPITAL_COMMUNITY): Payer: Self-pay | Admitting: Internal Medicine

## 2020-02-29 DIAGNOSIS — Z1211 Encounter for screening for malignant neoplasm of colon: Secondary | ICD-10-CM | POA: Diagnosis not present

## 2020-02-29 DIAGNOSIS — Z9889 Other specified postprocedural states: Secondary | ICD-10-CM | POA: Diagnosis not present

## 2020-02-29 DIAGNOSIS — Z0000 Encounter for general adult medical examination without abnormal findings: Secondary | ICD-10-CM | POA: Diagnosis not present

## 2020-02-29 DIAGNOSIS — G932 Benign intracranial hypertension: Secondary | ICD-10-CM | POA: Diagnosis not present

## 2020-02-29 DIAGNOSIS — E230 Hypopituitarism: Secondary | ICD-10-CM | POA: Diagnosis not present

## 2020-02-29 DIAGNOSIS — M5441 Lumbago with sciatica, right side: Secondary | ICD-10-CM | POA: Diagnosis not present

## 2020-02-29 DIAGNOSIS — Z13220 Encounter for screening for lipoid disorders: Secondary | ICD-10-CM | POA: Diagnosis not present

## 2020-02-29 DIAGNOSIS — E039 Hypothyroidism, unspecified: Secondary | ICD-10-CM | POA: Diagnosis not present

## 2020-02-29 DIAGNOSIS — Z125 Encounter for screening for malignant neoplasm of prostate: Secondary | ICD-10-CM | POA: Diagnosis not present

## 2020-02-29 MED FILL — LEVOTHYROXINE 75 MCG TABLET: 75 | 90 days supply | Qty: 90 | Fill #0

## 2020-03-03 ENCOUNTER — Telehealth: Payer: Self-pay | Admitting: Neurology

## 2020-03-03 NOTE — Telephone Encounter (Signed)
I received a note from Dr. Jola Schmidt, examination done on 22 February 2020.  No papilledema was seen.  This seems to be contradictory in terms of what the patient was told or understands, but he did have another event of near syncope with laughing.  Doing other lumbar puncture is reasonable therefore.

## 2020-03-04 ENCOUNTER — Other Ambulatory Visit: Payer: Self-pay

## 2020-03-04 ENCOUNTER — Other Ambulatory Visit: Payer: BC Managed Care – PPO

## 2020-03-05 ENCOUNTER — Other Ambulatory Visit (HOSPITAL_COMMUNITY): Payer: Self-pay | Admitting: Specialist

## 2020-03-05 DIAGNOSIS — D573 Sickle-cell trait: Secondary | ICD-10-CM | POA: Diagnosis not present

## 2020-03-05 DIAGNOSIS — M5459 Other low back pain: Secondary | ICD-10-CM | POA: Diagnosis not present

## 2020-03-05 MED ORDER — GABAPENTIN 300 MG PO CAPS: 300.0000 mg | ORAL_CAPSULE | Freq: Three times a day (TID) | ORAL | 11 refills | Status: DC

## 2020-03-05 NOTE — Telephone Encounter (Signed)
I called and talk with the wife.  She question whether the elevation in spinal fluid pressure could have something do with his back pain, indicated that this probably is unrelated.  We'll get the lumbar puncture to determine whether the spinal fluid pressure is elevated.

## 2020-03-05 NOTE — Telephone Encounter (Signed)
Pt's wife (on [place]) is asking for a call from Dr Jannifer Franklin to discuss pursuing the lumbar puncture

## 2020-03-05 NOTE — Addendum Note (Signed)
Addended by: Kathrynn Ducking on: 03/05/2020 03:47 PM   Modules accepted: Orders

## 2020-03-06 ENCOUNTER — Ambulatory Visit: Payer: 59 | Admitting: Neurology

## 2020-03-10 ENCOUNTER — Ambulatory Visit (INDEPENDENT_AMBULATORY_CARE_PROVIDER_SITE_OTHER): Payer: 59 | Admitting: Family Medicine

## 2020-03-10 ENCOUNTER — Telehealth: Payer: Self-pay | Admitting: Neurology

## 2020-03-10 DIAGNOSIS — G932 Benign intracranial hypertension: Secondary | ICD-10-CM

## 2020-03-10 NOTE — Telephone Encounter (Signed)
Patient's Wife calling for lumbar puncture to be scheduled . I  Need a order placed please and I can get sent to GI .  Thanks so much [place] .   Dr. Jannifer Franklin out this week .

## 2020-03-10 NOTE — Telephone Encounter (Signed)
Noted . Thanks Carl Knox Let me know if you need anything .

## 2020-03-10 NOTE — Telephone Encounter (Signed)
Carl Chen, do you know what Dr. Jannifer Franklin wanted to check on the lumbar puncture? It sounds like an opening pressure and closing pressure would be requested but I do not see any evidence of what test he would like to do on the lumbar puncture, since patient also has back pain. Since it does not seem to be an urgent request, please advise patient that we may have to wait till Dr. Jannifer Franklin is back in the office next week.  According to the phone note from last week, patient did not have any papilledema when he saw Dr. Valetta Close.

## 2020-03-11 NOTE — Addendum Note (Signed)
Addended by: Kathrynn Ducking on: 03/11/2020 08:07 AM   Modules accepted: Orders

## 2020-03-11 NOTE — Telephone Encounter (Signed)
I thought that I already put in the order for the lumbar puncture, if not, I will put it in now.  The patient has had recurrence of episodes of near syncope associated with laughing, this was his original presenting issue, we will recheck the opening pressure.

## 2020-03-12 ENCOUNTER — Other Ambulatory Visit: Payer: Self-pay

## 2020-03-12 ENCOUNTER — Ambulatory Visit
Admission: RE | Admit: 2020-03-12 | Discharge: 2020-03-12 | Disposition: A | Discharge status: Home / Self Care | Payer: 59 | Source: Ambulatory Visit | Attending: Neurology | Admitting: Neurology

## 2020-03-12 ENCOUNTER — Telehealth: Payer: Self-pay | Admitting: Neurology

## 2020-03-12 DIAGNOSIS — G932 Benign intracranial hypertension: Secondary | ICD-10-CM

## 2020-03-12 DIAGNOSIS — R55 Syncope and collapse: Secondary | ICD-10-CM | POA: Diagnosis not present

## 2020-03-12 DIAGNOSIS — H4710 Unspecified papilledema: Secondary | ICD-10-CM

## 2020-03-12 LAB — PROTEIN, CSF: Total Protein, CSF: 52 mg/dL — ABNORMAL HIGH (ref 15–45)

## 2020-03-12 LAB — CSF CELL COUNT WITH DIFFERENTIAL
RBC Count, CSF: 1 cells/uL — ABNORMAL HIGH
WBC, CSF: 1 cells/uL (ref 0–5)

## 2020-03-12 LAB — GLUCOSE, CSF: Glucose, CSF: 56 mg/dL (ref 40–80)

## 2020-03-12 NOTE — Telephone Encounter (Signed)
Pt.'s wife Carlyon Shadow is on [place]. She states husband's lumbar puncture has been done & she wants to give an update. Please advise.

## 2020-03-12 NOTE — Telephone Encounter (Signed)
I called the patient, talk with the wife.  Lumbar puncture shows a opening pressure 28 cm, this is not extremely elevated.  The patient initially was at 50 cm of water.  The patient has had another episode of near syncope, but he has sweats and sensation of near syncope with the event, he may be having vasovagal events.  The wife is to check blood pressures if this happens again.  I would not change his dose of Topamax now.

## 2020-03-12 NOTE — Discharge Instructions (Signed)

## 2020-03-13 ENCOUNTER — Ambulatory Visit: Payer: BC Managed Care – PPO | Admitting: Physical Therapy

## 2020-03-13 MED FILL — GABAPENTIN 300 MG CAPSULE: 300 | 10 days supply | Qty: 30 | Fill #0

## 2020-03-13 NOTE — Telephone Encounter (Signed)
Called Darlene back and she stated she had already followed up with Dr. Jannifer Franklin.

## 2020-03-14 MED FILL — TROKENDI XR 50 MG CAPSULE: 50 | 90 days supply | Qty: 270 | Fill #3

## 2020-03-14 NOTE — Telephone Encounter (Signed)
Pt.'s wife Carlyon Shadow states that since husband's lumbar puncture this week that he has had a mild headache last night into today. Pt.'s back has been achy & would like to speak with RN about this.

## 2020-03-16 ENCOUNTER — Telehealth: Payer: Self-pay | Admitting: Neurology

## 2020-03-16 DIAGNOSIS — R55 Syncope and collapse: Secondary | ICD-10-CM

## 2020-03-16 DIAGNOSIS — G971 Other reaction to spinal and lumbar puncture: Secondary | ICD-10-CM

## 2020-03-16 NOTE — Telephone Encounter (Signed)
Sounds like patient is having a spinal headache after LP last Wednesday. I told them to lay flat, fluids, tylenol, caffeine and that Dr. Jannifer Franklin' team would likely order a blood patch Monday morning. thanks

## 2020-03-17 ENCOUNTER — Other Ambulatory Visit: Payer: Self-pay | Admitting: Neurology

## 2020-03-17 ENCOUNTER — Other Ambulatory Visit: Payer: Self-pay

## 2020-03-17 ENCOUNTER — Ambulatory Visit
Admission: RE | Admit: 2020-03-17 | Discharge: 2020-03-17 | Disposition: A | Discharge status: Home / Self Care | Payer: 59 | Source: Ambulatory Visit | Attending: Neurology | Admitting: Neurology

## 2020-03-17 DIAGNOSIS — R51 Headache with orthostatic component, not elsewhere classified: Secondary | ICD-10-CM

## 2020-03-17 MED ORDER — IOPAMIDOL (ISOVUE-M 200) INJECTION 41%
1.0000 mL | Freq: Once | INTRAMUSCULAR | Status: AC
  Administered 2020-03-17: 1 mL via EPIDURAL

## 2020-03-17 NOTE — Telephone Encounter (Signed)
I called the patient left a message.  If the patient is still having troubles with spinal headaches, they are to call our office today and let us know, I will get a blood patch set up.

## 2020-03-17 NOTE — Telephone Encounter (Signed)
Pt.'s wife Carlyon Shadow states that husband does want the blood patch. She states he is still having a severe headache & is unable to work today because his head is hurting so badly.

## 2020-03-17 NOTE — Progress Notes (Signed)
20cc of blood drawn from pts RAC for blood patch, by A. Ilene Qua, RN. 1 successful attempt, pt tolerated well. Gauze and tape applied after.

## 2020-03-17 NOTE — Telephone Encounter (Signed)
I will put the order in for the blood patch.

## 2020-03-17 NOTE — Addendum Note (Signed)
Addended by: Kathrynn Ducking on: 03/17/2020 09:15 AM   Modules accepted: Orders

## 2020-03-17 NOTE — Discharge Instructions (Signed)

## 2020-03-18 ENCOUNTER — Telehealth: Payer: Self-pay

## 2020-03-18 ENCOUNTER — Other Ambulatory Visit: Payer: Self-pay | Admitting: Neurology

## 2020-03-18 MED ORDER — METHOCARBAMOL 500 MG PO TABS: 500.0000 mg | ORAL_TABLET | Freq: Three times a day (TID) | ORAL | 0 refills | Status: DC

## 2020-03-18 MED ORDER — DEXAMETHASONE 2 MG PO TABS: ORAL_TABLET | ORAL | 0 refills | Status: DC

## 2020-03-18 MED FILL — DEXAMETHASONE 2 MG TABLET: 2 | 3 days supply | Qty: 6 | Fill #0

## 2020-03-18 MED FILL — METHOCARBAMOL 500 MG TABS: 500 | 5 days supply | Qty: 15 | Fill #0

## 2020-03-18 NOTE — Telephone Encounter (Signed)
Pts wife, Carl Knox, called [facility] stating that her husband, Carl Knox, is having severe back pain from the blood patch he had done at our facility yesterday. Pt/cg reports relief in headache from the blood patch but now patient is having back pain at blood patch site. Pt/cg reports no other symptoms at this time. Pt/cg states the site looks clean and dry, no signs of infection, no bruising, etc... Dr. Laurence Ferrari, our radiologist today, was notified of this, and recommended the patient ice the site 20 minutes on and 20 minutes off as needed. If the pain continues throughout the night with no improvement tomorrow, for him to be seen at his PCP or referring physicians office. Pt/cg was explained this and they verbalized understanding. The pt/cg also has our direct number with any further questions and/or concerns.

## 2020-03-18 NOTE — Telephone Encounter (Signed)
Pt's wife, Zyiere Rosemond (on [place]) called, he has the blood patch. Now he is having severe pain where the blood patch was done. He still in the bed because of the pain. Would like a call from the nurse.

## 2020-03-18 NOTE — Telephone Encounter (Signed)
I called and talk with the wife.  The patient has had significant increase in low back pain since the blood patch, he has no pain going down the leg on either side.  He feels best lying down, it hurts to sit or stand.  I will send in a 3-day course of Decadron, and several days worth of Robaxin.  They will contact me if he is not getting better.

## 2020-03-18 NOTE — Addendum Note (Signed)
Addended by: Kathrynn Ducking on: 03/18/2020 05:20 PM   Modules accepted: Orders

## 2020-03-19 NOTE — Telephone Encounter (Signed)
I called the wife.  The patient had another episode of near syncope following a laughing episode.  The patient became diaphoretic, no reported chest pain or chest pressure.  The patient became dazed.  Heart rate went up to almost 150.  The episode sounds like a vasovagal event, but I will repeat the cardiac monitor study to make sure that he is not flipping into atrial fibrillation.  He was on steroids at the time of the event which can increase the risk for atrial fibrillation.  Fortunately, his back is feeling better.  If the patient has another event, they are to get his head down as quickly as possible.  The patient was in the sitting position when this event occurred.

## 2020-03-19 NOTE — Telephone Encounter (Signed)
Pt.'s wife Carlyon Shadow called in to state that husband had another episode last night around 10:30pm were he was laughing, then became dizzy & started sweating. She states episode lasted about 30 mins. & before this BP was 109/66 & during the episode pulse was 148. She states husband was out of it during the 30 mins. & afterwards his BP was 122/72 p.61. Wife states that his back is better & he has taken medications. Please advise.

## 2020-03-19 NOTE — Addendum Note (Signed)
Addended by: Kathrynn Ducking on: 03/19/2020 05:43 PM   Modules accepted: Orders

## 2020-03-20 ENCOUNTER — Ambulatory Visit: Payer: 59 | Admitting: Physical Therapy

## 2020-03-20 ENCOUNTER — Telehealth: Payer: Self-pay | Admitting: Neurology

## 2020-03-20 NOTE — Addendum Note (Signed)
Addended by: Kathrynn Ducking on: 03/20/2020 12:55 PM   Modules accepted: Orders

## 2020-03-20 NOTE — Telephone Encounter (Signed)
I called and talk with the wife.  The patient has had 3 events of feeling dizzy when he stands up, he'll become diaphoretic and become spaced out.  He does better when he lies down.  He is not having a lot of back pain currently, he is not to take his last dose of Decadron.  Given the flurry of events, I will also check a 2D echocardiogram and compare to the one that was done 4 years ago to make sure there has not been a change in cardiac function.  The patient sounds like he is having vasovagal events, I am not sure exactly why they are so frequent.  I have asked the wife to check orthostatic blood pressures and pulses sitting, lying down, and standing.

## 2020-03-20 NOTE — Telephone Encounter (Signed)
error 

## 2020-03-20 NOTE — Telephone Encounter (Signed)
Pt's wife called stating that since she last spoke to the provider yesterday the pt had 3 more episodes. Pt's BP was 110/61 and P was 52. Wife would like to discuss with provider.

## 2020-03-21 DIAGNOSIS — H40053 Ocular hypertension, bilateral: Secondary | ICD-10-CM | POA: Diagnosis not present

## 2020-03-27 ENCOUNTER — Ambulatory Visit: Payer: 59 | Admitting: Physical Therapy

## 2020-03-27 DIAGNOSIS — M5459 Other low back pain: Secondary | ICD-10-CM | POA: Diagnosis not present

## 2020-04-01 ENCOUNTER — Other Ambulatory Visit (HOSPITAL_COMMUNITY): Payer: Self-pay | Admitting: Specialist

## 2020-04-03 ENCOUNTER — Ambulatory Visit: Payer: 59 | Admitting: Physical Therapy

## 2020-04-03 ENCOUNTER — Other Ambulatory Visit: Payer: Self-pay

## 2020-04-03 ENCOUNTER — Ambulatory Visit (HOSPITAL_COMMUNITY): Payer: 59 | Attending: Cardiology

## 2020-04-03 ENCOUNTER — Telehealth: Payer: Self-pay | Admitting: Neurology

## 2020-04-03 DIAGNOSIS — M5416 Radiculopathy, lumbar region: Secondary | ICD-10-CM | POA: Diagnosis not present

## 2020-04-03 DIAGNOSIS — R55 Syncope and collapse: Secondary | ICD-10-CM | POA: Diagnosis not present

## 2020-04-03 LAB — ECHOCARDIOGRAM COMPLETE
Area-P 1/2: 3.77 cm2
S' Lateral: 3.1 cm

## 2020-04-03 NOTE — Telephone Encounter (Signed)
  I called the patient.  The 2D echocardiogram was unremarkable, fairly normal heart function.  A cardiac monitor study has been ordered.  2D echo 04/03/20:  IMPRESSIONS    1. Left ventricular ejection fraction by 3D volume is 65 %. The left ventricle has normal function. The left ventricle has no regional wall motion abnormalities. Left ventricular diastolic parameters were normal. The average left ventricular global  longitudinal strain is -24.9 %. The global longitudinal strain is normal.  2. Right ventricular systolic function is normal. The right ventricular size is normal. There is normal pulmonary artery systolic pressure. The estimated right ventricular systolic pressure is 32.9 mmHg.  3. The mitral valve is normal in structure. Trivial mitral valve regurgitation. No evidence of mitral stenosis.  4. The aortic valve is tricuspid. Aortic valve regurgitation is not visualized. No aortic stenosis is present.  5. Aortic dilatation noted. There is borderline dilatation of the aortic root, measuring 40 mm.  6. The inferior vena cava is normal in size with greater than 50% respiratory variability, suggesting right atrial pressure of 3 mmHg.  Conclusion(s)/Recommendation(s): Normal biventricular function without hemodynamically significant valvular heart disease.

## 2020-04-06 ENCOUNTER — Ambulatory Visit (INDEPENDENT_AMBULATORY_CARE_PROVIDER_SITE_OTHER): Payer: 59

## 2020-04-06 DIAGNOSIS — R55 Syncope and collapse: Secondary | ICD-10-CM | POA: Diagnosis not present

## 2020-04-09 ENCOUNTER — Ambulatory Visit (HOSPITAL_BASED_OUTPATIENT_CLINIC_OR_DEPARTMENT_OTHER): Payer: BC Managed Care – PPO

## 2020-04-15 ENCOUNTER — Ambulatory Visit: Payer: 59 | Attending: Neurology | Admitting: Physical Therapy

## 2020-04-15 ENCOUNTER — Inpatient Hospital Stay: Payer: 59

## 2020-04-15 ENCOUNTER — Encounter: Payer: Self-pay | Admitting: Hematology & Oncology

## 2020-04-15 ENCOUNTER — Encounter: Payer: Self-pay | Admitting: Physical Therapy

## 2020-04-15 ENCOUNTER — Inpatient Hospital Stay (HOSPITAL_BASED_OUTPATIENT_CLINIC_OR_DEPARTMENT_OTHER): Payer: 59 | Admitting: Hematology & Oncology

## 2020-04-15 ENCOUNTER — Other Ambulatory Visit: Payer: Self-pay

## 2020-04-15 ENCOUNTER — Other Ambulatory Visit: Payer: Self-pay | Admitting: Hematology & Oncology

## 2020-04-15 VITALS — BP 108/77 | HR 58 | Temp 98.3°F | Resp 20 | Ht 71.0 in | Wt 293.8 lb

## 2020-04-15 DIAGNOSIS — M545 Low back pain, unspecified: Secondary | ICD-10-CM

## 2020-04-15 DIAGNOSIS — Z8249 Family history of ischemic heart disease and other diseases of the circulatory system: Secondary | ICD-10-CM | POA: Diagnosis not present

## 2020-04-15 DIAGNOSIS — Z833 Family history of diabetes mellitus: Secondary | ICD-10-CM | POA: Diagnosis not present

## 2020-04-15 DIAGNOSIS — D573 Sickle-cell trait: Secondary | ICD-10-CM

## 2020-04-15 DIAGNOSIS — E039 Hypothyroidism, unspecified: Secondary | ICD-10-CM | POA: Insufficient documentation

## 2020-04-15 DIAGNOSIS — Z79899 Other long term (current) drug therapy: Secondary | ICD-10-CM | POA: Diagnosis not present

## 2020-04-15 DIAGNOSIS — G932 Benign intracranial hypertension: Secondary | ICD-10-CM | POA: Insufficient documentation

## 2020-04-15 DIAGNOSIS — Z8349 Family history of other endocrine, nutritional and metabolic diseases: Secondary | ICD-10-CM

## 2020-04-15 DIAGNOSIS — D509 Iron deficiency anemia, unspecified: Secondary | ICD-10-CM | POA: Insufficient documentation

## 2020-04-15 LAB — CBC WITH DIFFERENTIAL (CANCER CENTER ONLY)
Abs Immature Granulocytes: 0.03 10*3/uL (ref 0.00–0.07)
Basophils Absolute: 0 10*3/uL (ref 0.0–0.1)
Basophils Relative: 1 %
Eosinophils Absolute: 0.2 10*3/uL (ref 0.0–0.5)
Eosinophils Relative: 4 %
HCT: 41.8 % (ref 39.0–52.0)
Hemoglobin: 14.2 g/dL (ref 13.0–17.0)
Immature Granulocytes: 1 %
Lymphocytes Relative: 26 %
Lymphs Abs: 1.2 10*3/uL (ref 0.7–4.0)
MCH: 26.3 pg (ref 26.0–34.0)
MCHC: 34 g/dL (ref 30.0–36.0)
MCV: 77.6 fL — ABNORMAL LOW (ref 80.0–100.0)
Monocytes Absolute: 0.5 10*3/uL (ref 0.1–1.0)
Monocytes Relative: 11 %
Neutro Abs: 2.6 10*3/uL (ref 1.7–7.7)
Neutrophils Relative %: 57 %
Platelet Count: 222 10*3/uL (ref 150–400)
RBC: 5.39 MIL/uL (ref 4.22–5.81)
RDW: 13.9 % (ref 11.5–15.5)
WBC Count: 4.5 10*3/uL (ref 4.0–10.5)
nRBC: 0 % (ref 0.0–0.2)

## 2020-04-15 LAB — RETICULOCYTES
Immature Retic Fract: 11 % (ref 2.3–15.9)
RBC.: 5.43 MIL/uL (ref 4.22–5.81)
Retic Count, Absolute: 60.8 10*3/uL (ref 19.0–186.0)
Retic Ct Pct: 1.1 % (ref 0.4–3.1)

## 2020-04-15 LAB — SAVE SMEAR(SSMR), FOR PROVIDER SLIDE REVIEW

## 2020-04-15 MED ORDER — FOLIC ACID 1 MG PO TABS: 1.0000 mg | ORAL_TABLET | Freq: Every day | ORAL | 9 refills | Status: DC

## 2020-04-15 MED FILL — FOLIC ACID 1 MG TABS: 1 | 90 days supply | Qty: 90 | Fill #0

## 2020-04-15 NOTE — Progress Notes (Signed)
Referral MD  Reason for Referral: Microcytic anemia -- Sickle cell trait  Chief Complaint  Patient presents with  . New Patient (Initial Visit)    Referred for small red blood cells.  : I was told that I had small red blood cells.  HPI: Carl Knox is a very nice 55 year old African-American male.  He actually is a father of a couple other patients of mine.  His main problem has been pseudotumor cerebri.  He has had this for about 15 years.  He has occasional LPs to take off pressure.  Recently, he began to have a lot of lower back pain.  He does not do a lot of physically demanding work.  A MRI that was done I think back in February.  He does have quite a bit of disc bulging in the lower back.  There is no fractures.  He has spondylosis.  He says whenever he gets a lumbar puncture, his back pain feels better.  For some reason, his doctor is worried about his microcytic red cells.  He is not anemic.  He has sickle cell trait.  She wanted to make sure that there is no association.  He is not on folic acid.  I think 1 mg daily folic acid would be helpful.  He has had no issues with bleeding.  He has had no obvious crises.  He has had no rashes.  He does not smoke.  He really does not drink.  He has not had any past surgery.  His mother has sickle cell trait.  We also see her.  There has been no problems with Covid.  He has had no nausea or vomiting.  He has not a vegetarian.  There is been no bleeding.  He is up-to-date with his colonoscopies.  Overall, I would say his performance status is by ECOG 1.   Past Medical History:  Diagnosis Date  . Hypothyroidism   . Morbid obesity ([place])   . OSA on CPAP   . Other specified disorders of thyroid   . Papilledema   . Pseudotumor cerebri   . Sickle cell trait ([place])   . Syncope and collapse 02/06/2016  :  Past Surgical History:  Procedure Laterality Date  . CYST EXCISION  2001   Thyglossal duct  . TONSILLECTOMY  1980   :   Current Outpatient Medications:  .  gabapentin (NEURONTIN) 300 MG capsule, Take 1 capsule (300 mg total) by mouth 3 (three) times daily. (Patient taking differently: Take 300 mg by mouth 2 (two) times daily.), Disp: 90 capsule, Rfl: 11 .  levothyroxine (SYNTHROID, LEVOTHROID) 75 MCG tablet, Take 75 mcg by mouth daily., Disp: , Rfl:  .  Multiple Vitamin (MULTIVITAMIN) tablet, Take 1 tablet by mouth daily., Disp: , Rfl:  .  Topiramate ER (TROKENDI XR) 50 MG CP24, Take 150 mg by mouth at bedtime., Disp: 270 capsule, Rfl: 3 .  methocarbamol (ROBAXIN) 500 MG tablet, Take 1 tablet (500 mg total) by mouth 3 (three) times daily. (Patient not taking: Reported on 04/15/2020), Disp: 15 tablet, Rfl: 0 .  Vitamin D, Ergocalciferol, (DRISDOL) 1.25 MG (50000 UNIT) CAPS capsule, Take 1 capsule (50,000 Units total) by mouth every 7 (seven) days. (Patient not taking: No sig reported), Disp: 12 capsule, Rfl: 0:  :  Allergies  Allergen Reactions  . Valtrex [Valacyclovir Hcl]   :  Family History  Problem Relation Age of Onset  . Hypertension Mother   . Heart disease Mother   .  Kidney disease Mother   . Hyperlipidemia Mother   . Sleep apnea Mother   . Obesity Mother   . Diabetes Father   . Hypertension Father   . Hyperlipidemia Father   . Heart disease Father   . Kidney disease Father   . Obesity Father   :  Social History   Socioeconomic History  . Marital status: Married    Spouse name: Darlene  . Number of children: 2  . Years of education: Some grad school  . Highest education level: Not on file  Occupational History  . Occupation: Heavy Glass blower/designer    Comment: full time  Tobacco Use  . Smoking status: Never Smoker  . Smokeless tobacco: Never Used  Substance and Sexual Activity  . Alcohol use: Yes    Alcohol/week: 0.0 standard drinks    Comment: Occasional  . Drug use: No  . Sexual activity: Not on file    Comment: Married  Other Topics Concern  . Not on file  Social  History Narrative   Lives at home w/ his wife   Left-handed   Caffeine: 2 cups daily   Social Determinants of Health   Financial Resource Strain: Not on file  Food Insecurity: Not on file  Transportation Needs: Not on file  Physical Activity: Not on file  Stress: Not on file  Social Connections: Not on file  Intimate Partner Violence: Not on file  :  Review of Systems  Constitutional: Negative.   HENT: Negative.   Eyes: Negative.   Respiratory: Negative.   Cardiovascular: Negative.   Gastrointestinal: Negative.   Genitourinary: Negative.   Musculoskeletal: Positive for back pain.  Skin: Negative.   Neurological: Positive for headaches.  Endo/Heme/Allergies: Negative.   Psychiatric/Behavioral: Negative.      Exam:   Physical Exam Vitals reviewed.  HENT:     Head: Normocephalic and atraumatic.  Eyes:     Pupils: Pupils are equal, round, and reactive to light.  Cardiovascular:     Rate and Rhythm: Normal rate and regular rhythm.     Heart sounds: Normal heart sounds.  Pulmonary:     Effort: Pulmonary effort is normal.     Breath sounds: Normal breath sounds.  Abdominal:     General: Bowel sounds are normal.     Palpations: Abdomen is soft.  Musculoskeletal:        General: No tenderness or deformity. Normal range of motion.     Cervical back: Normal range of motion.  Lymphadenopathy:     Cervical: No cervical adenopathy.  Skin:    General: Skin is warm and dry.     Findings: No erythema or rash.  Neurological:     Mental Status: He is alert and oriented to person, place, and time.  Psychiatric:        Behavior: Behavior normal.        Thought Content: Thought content normal.        Judgment: Judgment normal.     @IPVITALS @   Recent Labs    04/15/20 1106  WBC 4.5  HGB 14.2  HCT 41.8  PLT 222   No results for input(s): NA, K, CL, CO2, GLUCOSE, BUN, CREATININE, CALCIUM in the last 72 hours.  Blood smear review: Slightly microcytic red blood  cells.  I see no nucleated red blood cells.  He has a couple target cells.  There is no anisocytosis were poikilocytosis.  I see no rouleaux formation.  He has no schistocytes or spherocytes.  White blood cells appear normal in morphology maturation.  He has no hypersegmented polys.  Platelets are adequate in number and size.  Pathology: None    Assessment and Plan: Ms. Belay is a very nice 55 year old African-American male.  He has microcytic anemia.  From the blood smear by the history, he has sickle cell trait.  Again, I would expect that he would have some microcytic red blood cells.  I do not see any association with respect to him having this microcytosis with his red blood cells and with the back issues or with the pseudotumor cerebri.  I do think that he does need folic acid.  I think this would be very reasonable for him to get on.  I will call this in for him.  I just do not see a problem that we have to deal with.  I does not think we are adding to his medical care.  His main problem clearly is the back issues.  He sees Dr. Erasmo Score of orthopedic surgery.  He sees Dr. Floyde Parkins of neurology for the pseudotumor.  If there is any problems down the road, I will be more than happy to see him.  I told him that he needs to stay well hydrated.  I suppose he could have some kidney issues from the sickle cell trait although this would be unusual.  It was nice to see him.  I basically seeing most of his family.

## 2020-04-15 NOTE — Therapy (Addendum)
[place], [place], [address] Phone: [phone]   Fax:  [phone]  Physical Therapy Treatment/Recertification / discharge  Patient Details  Name: Carl Knox MRN: [id number] Date of Birth: November 01, 1965 Referring Provider (PT): Margette Fast, MD   Encounter Date: 04/15/2020   PT End of Session - 04/15/20 2143    Visit Number 2    Number of Visits 10    Date for PT Re-Evaluation 05/27/20    PT Start Time 1716    PT Stop Time 1808    PT Time Calculation (min) 52 min    Activity Tolerance Patient tolerated treatment well    Behavior During Therapy [facility] for tasks assessed/performed           Past Medical History:  Diagnosis Date  . Hypothyroidism   . Morbid obesity ([place])   . OSA on CPAP   . Other specified disorders of thyroid   . Papilledema   . Pseudotumor cerebri   . Sickle cell trait ([place])   . Syncope and collapse 02/06/2016    Past Surgical History:  Procedure Laterality Date  . CYST EXCISION  2001   Thyglossal duct  . TONSILLECTOMY  1980    There were no vitals filed for this visit.   Subjective Assessment - 04/15/20 2124    Subjective Pt. returns, not seen since initial evaluation. He had lumbar MRI 02/28/20 which showed right-sided disc protrusion at L4-5 and subsequently had injection as well as lumbar puncture/spinal tap to alleviate pressure from pseudotumor cerebri. He reports this helped LBP so CSF pressure suspected as contributing factor to LBP but symptoms have since returned. He reports recommended by MD to continue PT and try dry needling. Current symptoms local to right lower lumbar region/no RLE radiating symptoms noted. He is also currently wearing heart monitor per recent orders from cardiologist.    Pertinent History sickle cell trait, right ankle injury, syncope (last episode 2020), obesity, pseudotumor cerebri, see PMH    Diagnostic tests MRI    Currently in Pain? Yes     Pain Score 2     Pain Location Back    Pain Orientation Right;Lower    Pain Type Chronic pain    Pain Onset More than a month ago    Pain Frequency Intermittent    Aggravating Factors  bending, prolonged sitting    Pain Relieving Factors movement    Effect of Pain on Daily Activities limits positional tolerance              [facility] PT Assessment - 04/15/20 0001      Assessment   Medical Diagnosis Acute right-sided LBP    Referring Provider (PT) Margette Fast, MD      Observation/Other Assessments   Focus on Therapeutic Outcomes (FOTO)  70% function      AROM   Right Hip Flexion 100    Lumbar Flexion 80    Lumbar Extension 30    Lumbar - Right Side Bend 30    Lumbar - Left Side Bend 30    Lumbar - Right Rotation 50%    Lumbar - Left Rotation 50%      Strength   Right Hip Flexion 5/5    Right Hip Extension 4/5    Right Hip External Rotation  5/5    Right Hip Internal Rotation 5/5    Right Hip ABduction 4+/5    Left Hip Flexion 5/5    Left Hip Extension 4/5  Left Hip External Rotation 5/5    Left Hip Internal Rotation 5/5    Left Hip ABduction 5/5    Right Knee Flexion 5/5    Right Knee Extension 5/5    Left Knee Flexion 5/5    Left Knee Extension 5/5    Right Ankle Dorsiflexion 4+/5    Right Ankle Inversion 4+/5    Right Ankle Eversion 4+/5    Left Ankle Dorsiflexion 5/5    Left Ankle Inversion 5/5    Left Ankle Eversion 5/5      Special Tests   Other special tests (+) SLR on right                         [facility] Adult PT Treatment/Exercise - 04/15/20 0001      Exercises   Exercises Lumbar      Lumbar Exercises: Stretches   Piriformis Stretch Right;3 reps;30 seconds    Other Lumbar Stretch Exercise extension in standing with therapist support holding mobilization belt 2x10    Other Lumbar Stretch Exercise seated and supine right sciatic nerve floss with knee extension/ankle DF and knee flexion/ankle PF x 20 ea.      Lumbar Exercises:  Supine   Pelvic Tilt 15 reps    Clam 20 reps    Clam Limitations blue band    Bridge 20 reps      Manual Therapy   Manual Therapy Joint mobilization;Soft tissue mobilization    Joint Mobilization LAD right hip grade I-III oscillations    Soft tissue mobilization STM right lumbar paraspinals in left sidelying            Trigger Point Dry Needling - 04/15/20 0001    Consent Given? Yes    Education Handout Provided Yes    Muscles Treated Back/Hip Erector spinae;Lumbar multifidi    Dry Needling Comments Needling in left sidelying to right lumbar longissimus and multifidi at L4-5 region with 30 gauge 50 mm needles                PT Education - 04/15/20 2142    Education Details HEP, POC, dry needling    Person(s) Educated Patient    Methods Explanation;Demonstration;Verbal cues;Handout    Comprehension Verbalized understanding;Returned demonstration               PT Long Term Goals - 04/15/20 2150      PT LONG TERM GOAL #1   Title Independent with HEP    Baseline updated today    Time 6    Period Weeks    Status On-going    Target Date 05/27/20      PT LONG TERM GOAL #2   Title Improve FOTO outcome measure score to 78% or greater functional status    Baseline 70%    Time 6    Period Weeks    Status On-going    Target Date 05/27/20      PT LONG TERM GOAL #3   Title Increase hip flexion AROM 5-10 deg bilat. to improve ability to donn shoes    Baseline 100    Time 6    Status Achieved      PT LONG TERM GOAL #4   Title Tolerate seated positions and sit<>stand with LBP symptoms/pain decreased 60% or greater from baseline status    Baseline still having pain     Time 6    Period Weeks    Status On-going    Target  Date 05/27/20      PT LONG TERM GOAL #5   Title Increase bilat. hip extension and right hip abduction strength at least 1/2 MMT grade from baseline status to improve ability for transfers and lifting activities for work    Baseline hip  extension 4/5 bilat., right hip abd 4+/5    Time 6    Period Weeks    Status On-going    Target Date 05/27/20                 Plan - 04/15/20 2144    Clinical Impression Statement Pt. returns after absence from therapy as noted in subjective with MRI findings as noted in subjective as well as reported temporary symptom relief following spinal tap. Previous radiating symptoms to hip/buttock region now centralized but pt. still demonstrates right LE neural tendion. Mild improvement in FOTO score from baseline otherwise objective status for ROM and strength similar to that at initial eval. Plan resume/continue therapy to work on relieving LBP and addressing associated functional limitations.    Personal Factors and Comorbidities Comorbidity 3+    Comorbidities see PMH    Examination-Activity Limitations Dressing;Sit    Stability/Clinical Decision Making Evolving/Moderate complexity    Clinical Decision Making Moderate    Rehab Potential Good    PT Frequency --   1-2x/week   PT Duration 6 weeks    PT Treatment/Interventions ADLs/Self Care Home Management;Cryotherapy;Traction;Electrical Stimulation;Moist Heat;Therapeutic exercise;Neuromuscular re-education;Functional mobility training;Therapeutic activities;Taping;Dry needling;Spinal Manipulations;Manual techniques;Patient/family education    PT Next Visit Plan continue dry needling as found beneficial, manual/LAD right hip and consider trial mechanical traction, continue extension bias ROM and lumbar/core strengthening progression but limited ability prone positioning while wearing heart monitor    PT Home Exercise Plan Access code: [email or web address]    Consulted and Agree with Plan of Care Patient           Patient will benefit from skilled therapeutic intervention in order to improve the following deficits and impairments:  Pain,Impaired flexibility,Decreased strength,Increased muscle spasms,Decreased range of motion,Hypomobility  Visit  Diagnosis: Acute right-sided low back pain without sciatica     Problem List Patient Active Problem List   Diagnosis Date Noted  . Vitamin D deficiency 09/25/2019  . Class 3 severe obesity with serious comorbidity and body mass index (BMI) of 40.0 to 44.9 in adult ([place]) 09/25/2019  . Papilledema 07/11/2018  . Obstructive sleep apnea on CPAP 10/12/2016  . Pseudotumor cerebri 07/09/2016  . History of obstructive sleep apnea 07/09/2016  . Syncope and collapse 02/06/2016    Beaulah Dinning, PT, DPT 04/15/20 9:53 PM  [facility] [address] [place], [place], [address] Phone: [phone]   Fax:  [phone]  Name: Carl Knox MRN: [id number] Date of Birth: 01-09-1966     PHYSICAL THERAPY DISCHARGE SUMMARY  Visits from Start of Care: 2  Current functional level related to goals / functional outcomes: See goals   Remaining deficits: Current status unknown   Education / Equipment: HEP  Plan: Patient agrees to discharge.  Patient goals were not met. Patient is being discharged due to not returning since the last visit.  ?????     Kristoffer Leamon PT, DPT, LAT, ATC  05/27/20  2:55 PM

## 2020-04-15 NOTE — Patient Instructions (Signed)

## 2020-04-16 LAB — IRON AND TIBC
Iron: 74 ug/dL (ref 42–163)
Saturation Ratios: 24 % (ref 20–55)
TIBC: 306 ug/dL (ref 202–409)
UIBC: 232 ug/dL (ref 117–376)

## 2020-04-16 LAB — FERRITIN: Ferritin: 300 ng/mL (ref 24–336)

## 2020-04-16 NOTE — Addendum Note (Signed)
Addended by: Herma Carson on: 04/16/2020 02:37 PM   Modules accepted: Orders

## 2020-04-17 LAB — HGB SOLUBILITY: Hgb Solubility: POSITIVE — AB

## 2020-04-17 LAB — HGB FRACTIONATION CASCADE
Hgb A2: 2.9 % (ref 1.8–3.2)
Hgb A: 61.5 % — ABNORMAL LOW (ref 96.4–98.8)
Hgb F: 0 % (ref 0.0–2.0)
Hgb S: 35.6 % — ABNORMAL HIGH

## 2020-04-24 ENCOUNTER — Encounter: Payer: 59 | Admitting: Physical Therapy

## 2020-04-24 ENCOUNTER — Ambulatory Visit: Payer: 59 | Admitting: Physical Therapy

## 2020-04-24 DIAGNOSIS — D573 Sickle-cell trait: Secondary | ICD-10-CM | POA: Diagnosis not present

## 2020-04-24 DIAGNOSIS — H0510 Unspecified chronic inflammatory disorders of orbit: Secondary | ICD-10-CM | POA: Diagnosis not present

## 2020-04-24 DIAGNOSIS — M5459 Other low back pain: Secondary | ICD-10-CM | POA: Diagnosis not present

## 2020-05-09 ENCOUNTER — Telehealth: Payer: Self-pay | Admitting: Neurology

## 2020-05-09 ENCOUNTER — Other Ambulatory Visit (HOSPITAL_COMMUNITY): Payer: Self-pay

## 2020-05-09 MED ORDER — TROKENDI XR 100 MG PO CP24
200.0000 mg | ORAL_CAPSULE | Freq: Every day | ORAL | 3 refills | Status: DC
  Filled 2020-05-09: qty 60, 30d supply, fill #0
  Filled 2020-07-04: qty 60, 30d supply, fill #1
  Filled 2020-08-04: qty 60, 30d supply, fill #2
  Filled 2020-09-04: qty 60, 30d supply, fill #3

## 2020-05-09 NOTE — Telephone Encounter (Signed)
I will talk with the wife.  The patient is still having a lot of low back pain.  The back pain got better after the lumbar puncture and got worse after the blood patch.  Opening pressure was 28 cm of water, not extremely elevated.   I am not clear that increased intracranial pressure would worsen back pain.  Having said this, we will go up on the Topamax to 200 mg daily.  The patient tends to run fairly low systolic blood pressures in the 100-1 10 range, no orthostasis noted.  The patient did not have a syncopal event while being monitored.

## 2020-05-09 NOTE — Telephone Encounter (Signed)
  I called the patient, left a message.  Cardiac monitor study was unremarkable.  They are to let me know if the episodes are still recurring.  I am not clear whether or not he had an episode while being monitored.  Cardiac monitor study 05/08/20:  Narrative & Impression Sinus bradycardia, sinus rhythm    Average HR 60s   No arrhythmias detected

## 2020-05-13 DIAGNOSIS — M545 Low back pain, unspecified: Secondary | ICD-10-CM | POA: Diagnosis not present

## 2020-05-20 DIAGNOSIS — M545 Low back pain, unspecified: Secondary | ICD-10-CM | POA: Diagnosis not present

## 2020-05-27 ENCOUNTER — Ambulatory Visit: Payer: 59 | Admitting: Physical Therapy

## 2020-05-27 ENCOUNTER — Telehealth: Payer: Self-pay | Admitting: Physical Therapy

## 2020-05-27 DIAGNOSIS — M545 Low back pain, unspecified: Secondary | ICD-10-CM | POA: Diagnosis not present

## 2020-05-27 NOTE — Telephone Encounter (Signed)
LVM regarding patients attendance. He was initially evaluated on 2/8 and cancelled all appointments. Then was re-evaluated on 3/29, and had cancelled visits including the visit he had scheduled today at 4:30 . I discussed in a 3 month period he had been seen for 2 visits and cancelled 7 appointments, with reasons being mostly appointment conflicts. I noted that based on his attendance it appears that physical therapy isn't appropriate at this time, and that we plan to discharge him from PT today. If he has any questions he is welcome to call the clinic and I provided the clinic number.  Hanaan Gancarz PT, DPT, LAT, ATC  05/27/20  2:54 PM

## 2020-05-30 ENCOUNTER — Telehealth: Payer: Self-pay | Admitting: Physical Therapy

## 2020-05-30 NOTE — Telephone Encounter (Signed)
Returned [name] (patient's wife). She talked about the previous message that was left regarding [name]'s recent discharge. Time was taken to review the attendance policy noting about the high cancellations and the frequency in which he had attended his visits with the evaluation on 02/26/2020, and had multiple cancellations with the reason in Epic as appt conflict, and was re-certified on 4/69/6295. His last scheduled appointment was with me on 05/27/2020 which was cancelled with the reason being an appt conflict.   [name] mentioned the clinic policy, and that there needed to be some extenuating circumstances. I dicussed that the policy is meant to keep everyone (patients and clinicians) on the same page and make it fair across the system. She noted challenges with patient access and keeping care local which I referred to the policy as our way of working on promoting  patient access as heavy cancellations/ no shows takes appointments from other patient's limiting their care as well. We reach out to the patent's on frequent no-shows and heavy cancellations and attempted to discuss our policy. I attempted calling both her number which went to voicemail and was unable to leave a message due to it being full, and I attempted calling their home phone which I left a message that was document on 05/27/2020.    I discussed I am happy to see her husband and extend care but given the time since he was last seen in the clinic we would need to do a re-evaluation and assess what his current status is since his last attended visit was on 04/15/2020.  I got them scheduled for a Re-evaluation on 05/31/2020 at 11:15.   I referenced one of the best ways to get a hold of me was to  send a message via mychart, or directly would be either to call the clinic and ask for me.  [name] PT, DPT, LAT, ATC  05/30/20  4:14 PM

## 2020-05-31 ENCOUNTER — Ambulatory Visit: Payer: 59 | Attending: Neurology | Admitting: Physical Therapy

## 2020-05-31 ENCOUNTER — Encounter: Payer: Self-pay | Admitting: Physical Therapy

## 2020-05-31 ENCOUNTER — Other Ambulatory Visit: Payer: Self-pay

## 2020-05-31 DIAGNOSIS — M545 Low back pain, unspecified: Secondary | ICD-10-CM | POA: Diagnosis not present

## 2020-05-31 NOTE — Therapy (Signed)
[place], [place], [address] Phone: [phone]   Fax:  [phone]  Physical Therapy Treatment / Re-evaluation  Patient Details  Name: Carl Knox MRN: [id number] Date of Birth: 1965/10/06 Referring Provider (PT): Margette Fast, MD   Encounter Date: 05/31/2020   PT End of Session - 05/31/20 1115    Visit Number 3    Number of Visits 10    Date for PT Re-Evaluation 07/26/20    Authorization Type recheck FOTO by visit 6 and 10th visit    PT Start Time 1115    PT Stop Time 1200    PT Time Calculation (min) 45 min    Activity Tolerance Patient tolerated treatment well    Behavior During Therapy [facility] for tasks assessed/performed           Past Medical History:  Diagnosis Date  . Hypothyroidism   . Morbid obesity ([place])   . OSA on CPAP   . Other specified disorders of thyroid   . Papilledema   . Pseudotumor cerebri   . Sickle cell trait ([place])   . Syncope and collapse 02/06/2016    Past Surgical History:  Procedure Laterality Date  . CYST EXCISION  2001   Thyglossal duct  . TONSILLECTOMY  1980    There were no vitals filed for this visit.   Subjective Assessment - 05/31/20 1116    Subjective pt arrives to pt today after his last attended on 3/29. pt reports he is unsure if the last session DN/ treatment helped due to the frequent changes in his medication. He reports the pain in the back continues to effect depending on what he does during the day. He reports continued lower back pin with referral to the glute region. He reported he has been doing PT at [facility] physical therapy for his low back and notes he was having low back pain since his last visit there which was on Tuesday at 5/10, and report he is still actively on their caseload.    Pertinent History sickle cell trait, right ankle injury, syncope (last episode 2020), obesity, pseudotumor cerebri, see PMH    Diagnostic tests MRI 02/28/2020  IMPRESSION:  Lumbar spondylosis as outlined and having progressed at several  levels since the prior MRI of 12/26/2018. Findings are most notably  as follows.     At L4-L5, there is a minimal disc bulge and endplate spurring. New  superimposed small right subarticular disc protrusion. Mild facet  arthrosis. The disc protrusion contributes to mild right  subarticular narrowing, contacting the descending right L5 nerve  root. Correlate for right L5 radiculopathy. Mild bilateral neural  foraminal narrowing is unchanged.     At L1-L2, there is a disc bulge. Superimposed broad-based left  center to left foraminal disc protrusion, slightly progressed. Mild  relative left subarticular and central canal narrowing. Mild left  inferior neural foraminal narrowing.     New L3-L4 small left foraminal/extraforaminal disc protrusion  contributing to mild left neural foraminal narrowing.     Additional sites of mild neural foraminal narrowing as described.     Also of note, there are trace bilateral facet joint effusions at  L2-L3, L3-L4 and L4-L5.    Patient Stated Goals Get back better    Currently in Pain? Yes    Pain Score 1    at worst 7/10   Pain Location Back    Pain Orientation Right;Lower    Pain Descriptors / Indicators Aching;Sore;Shooting  Pain Type Chronic pain    Pain Radiating Towards reported referral down to the R calf    Pain Onset More than a month ago    Pain Frequency Constant    Aggravating Factors  prolonged sitting, soreness end of the day    Pain Relieving Factors medication    Effect of Pain on Daily Activities limited positional tolerance              [facility] PT Assessment - 05/31/20 0001      Assessment   Medical Diagnosis Acute right-sided LBP    Referring Provider (PT) Margette Fast, MD      Precautions   Precautions None      Restrictions   Weight Bearing Restrictions No      Balance Screen   Has the patient fallen in the past 6 months No      Prior Function    Level of Independence Independent with basic ADLs;Independent with community mobility without device      Cognition   Overall Cognitive Status Within Functional Limits for tasks assessed      Observation/Other Assessments   Focus on Therapeutic Outcomes (FOTO)  77% function      Sensation   Light Touch Appears Intact      AROM   Lumbar Flexion 80    Lumbar Extension 34    Lumbar - Right Side Bend 20    Lumbar - Left Side Bend 20      Strength   Right Hip Flexion 5/5    Right Hip Extension 5/5    Right Hip External Rotation  5/5    Right Hip Internal Rotation 5/5    Right Hip ABduction 4+/5    Left Hip Flexion 5/5    Left Hip Extension 5/5    Left Hip External Rotation 5/5    Left Hip Internal Rotation 5/5    Left Hip ABduction 4+/5    Right Ankle Dorsiflexion 5/5    Right Ankle Inversion 5/5    Right Ankle Eversion 5/5                         [facility] Adult PT Treatment/Exercise - 05/31/20 0001      Lumbar Exercises: Stretches   Piriformis Stretch 2 reps;30 seconds;Right   seated     Manual Therapy   Manual therapy comments skilled palpation and monitoring of pt throughout PTDN    Soft tissue mobilization IASTM along R lumber parapsinals            Trigger Point Dry Needling - 05/31/20 0001    Consent Given? Yes    Education Handout Provided Previously provided    Muscles Treated Back/Hip Erector spinae;Lumbar multifidi    Lumbar multifidi Response Twitch response elicited;Palpable increased muscle length   R in prone L3, L4 and L5               PT Education - 05/31/20 1134    Education Details Reviewed HEP and goals, FOTO reassessment. Importance of consistency of exericse and attendance to PT to [facility] beneifts of treatment.  discussed benefit of DN but it is to be combined with exercise to maximize relief of the low back.  Discussed that if he is being seen for the same condition/ referral at two different clinics that are in different  systems insurance may not cover both.    Person(s) Educated Patient    Methods Explanation;Verbal cues    Comprehension  Verbal cues required               PT Long Term Goals - 05/31/20 1131      PT LONG TERM GOAL #1   Title Independent with HEP    Period Weeks    Status On-going    Target Date 07/12/20      PT LONG TERM GOAL #2   Title Improve FOTO outcome measure score to 78% or greater functional status    Period Weeks    Status Partially Met    Target Date 07/12/20      PT LONG TERM GOAL #3   Title Increase hip flexion AROM 5-10 deg bilat. to improve ability to donn shoes    Period Weeks    Status Achieved    Target Date 07/12/20      PT LONG TERM GOAL #4   Title Tolerate seated positions and sit<>stand with LBP symptoms/pain decreased 60% or greater from baseline status    Period Weeks    Status Achieved      PT LONG TERM GOAL #5   Title Increase bilat. hip extension and right hip abduction strength at least 1/2 MMT grade from baseline status to improve ability for transfers and lifting activities for work    Period Weeks    Target Date 07/12/20                 Plan - 05/31/20 1206    Clinical Impression Statement pt returns to PT at [address] since his last attended visit on 04/15/2020. He demonstrates functional trunk mobility and lower extremity strength with no reproduction of concordant symptoms noted during assessment. He demonstrates tenderness located mostly along the R low lumbar paraspinals pointing to the fortins area as well. reviewed TPDN and performed on the R lumbar multifidi followed with IASTM techniques and pififormis stretching. He reported reduced stiffness inthe back following treatment in sitting. He would benefit from physical therapy to reduce low bcak pain, promote efficient posture and lifting mechanics, increase endurance as it relates to his ADLs and maximize his function by addressing the deficits listed.    PT Frequency 1x /  week    PT Duration 8 weeks    PT Treatment/Interventions ADLs/Self Care Home Management;Cryotherapy;Traction;Electrical Stimulation;Moist Heat;Therapeutic exercise;Neuromuscular re-education;Functional mobility training;Therapeutic activities;Taping;Dry needling;Spinal Manipulations;Manual techniques;Patient/family education    PT Next Visit Plan continue dry needling as found beneficial, manual/LAD right hip and consider trial mechanical traction, continue extension bias ROM and lumbar/core strengthening progression but limited ability prone positioning while wearing heart monitor    Consulted and Agree with Plan of Care Patient           Patient will benefit from skilled therapeutic intervention in order to improve the following deficits and impairments:  Pain,Impaired flexibility,Decreased strength,Increased muscle spasms,Decreased range of motion,Hypomobility  Visit Diagnosis: Acute right-sided low back pain without sciatica     Problem List Patient Active Problem List   Diagnosis Date Noted  . Vitamin D deficiency 09/25/2019  . Class 3 severe obesity with serious comorbidity and body mass index (BMI) of 40.0 to 44.9 in adult ([place]) 09/25/2019  . Papilledema 07/11/2018  . Obstructive sleep apnea on CPAP 10/12/2016  . Pseudotumor cerebri 07/09/2016  . History of obstructive sleep apnea 07/09/2016  . Syncope and collapse 02/06/2016   Starr Lake PT, DPT, LAT, ATC  05/31/20  12:32 PM      [facility] [address] [place], [place], [address] Phone:  [phone]   Fax:  [phone]  Name: Carl Knox MRN: [id number] Date of Birth: [birth year]

## 2020-06-02 ENCOUNTER — Telehealth: Payer: Self-pay | Admitting: Neurology

## 2020-06-02 ENCOUNTER — Other Ambulatory Visit (HOSPITAL_COMMUNITY): Payer: Self-pay

## 2020-06-02 ENCOUNTER — Other Ambulatory Visit: Payer: Self-pay | Admitting: Neurology

## 2020-06-02 MED FILL — Levothyroxine Sodium Tab 75 MCG: ORAL | 90 days supply | Qty: 90 | Fill #0 | Status: AC

## 2020-06-02 NOTE — Telephone Encounter (Signed)
I called both numbers.  I was unable to reach anyone, left messages.  I will call back later.

## 2020-06-02 NOTE — Telephone Encounter (Signed)
Pt's daughter, Carl Knox (on [place]) called, having increase pain right back radiating down right leg.  Have increased  Topiramate ER (TROKENDI XR) 100 MG CP24 to 200 mg. Wandering if I can increase any higher. He is continuing to go to PT, but having more pain after PT.

## 2020-06-02 NOTE — Telephone Encounter (Signed)
The wife called back.  The patient is having ongoing pain down the right leg.  He had an epidural injection previously that worked only for few hours but alleviated his pain down the right leg.  They will be seeing Dr. Maxie Better tomorrow, would recommend getting another epidural injection done through Dr. Nelva Bush.  If this is not effective, we can go up on the Topamax.

## 2020-06-03 ENCOUNTER — Other Ambulatory Visit (HOSPITAL_COMMUNITY): Payer: Self-pay

## 2020-06-03 DIAGNOSIS — H0510 Unspecified chronic inflammatory disorders of orbit: Secondary | ICD-10-CM | POA: Diagnosis not present

## 2020-06-03 DIAGNOSIS — D573 Sickle-cell trait: Secondary | ICD-10-CM | POA: Diagnosis not present

## 2020-06-03 DIAGNOSIS — M545 Low back pain, unspecified: Secondary | ICD-10-CM | POA: Diagnosis not present

## 2020-06-04 DIAGNOSIS — M545 Low back pain, unspecified: Secondary | ICD-10-CM | POA: Diagnosis not present

## 2020-06-09 DIAGNOSIS — M5416 Radiculopathy, lumbar region: Secondary | ICD-10-CM | POA: Insufficient documentation

## 2020-06-10 DIAGNOSIS — M5416 Radiculopathy, lumbar region: Secondary | ICD-10-CM | POA: Diagnosis not present

## 2020-06-11 ENCOUNTER — Other Ambulatory Visit: Payer: Self-pay

## 2020-06-11 ENCOUNTER — Encounter: Payer: Self-pay | Admitting: Dietician

## 2020-06-11 ENCOUNTER — Encounter: Payer: 59 | Attending: Internal Medicine | Admitting: Dietician

## 2020-06-11 ENCOUNTER — Other Ambulatory Visit (HOSPITAL_COMMUNITY): Payer: Self-pay

## 2020-06-11 DIAGNOSIS — Z6841 Body Mass Index (BMI) 40.0 and over, adult: Secondary | ICD-10-CM | POA: Insufficient documentation

## 2020-06-11 NOTE — Progress Notes (Signed)
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 Pt states they are very busy all day, work is moderately physical. Pt does not want to eat while at work to avoid contact with others due to [place] concerns. Pt reports multiple outbreaks at work since [place] started. Pt is task orieneted, works well with a regiment. Pt is battling lower back pain that occurred spontaneously around Christmas. Pt states it can inhibit physical activity at times. Pain is well managed through medication and physical therapy. Pt is interested in losing weight to alleviate back pain.  Pt was previously going to a weight loss clinic for about a year to accompany their daughter and support her. Pt previously went on a high protein, low carb diet diet and lost some weight (25-35 lbs in 2 months) before going to the weight loss clinic. Pt states weight loss began to plateua after going to the weight loss clinic. Pt reports being around their current weight for about 30 years. Pt sleeps in two separate occasions each day, 2-3 hours after work and then 5-6 hours later in the evening.   Estimated daily fluid intake: 64 oz Supplements: Multivtiamin Sleep: Sleeps well, two separate bouts of sleep Stress / self-care: 3/10, short terms  Current average weekly physical activity: ADL, row machine   24-Hr Dietary Recall First Meal: Premier protein shakes Snack: none Second Meal: 12 piece  [employer] nuggets, fries Snack: none Third Meal: Cheeseburger, mustard, ketchup. Snack: none Beverages: Diet green tea   NUTRITION DIAGNOSIS  NB-1.1 Food and nutrition-related knowledge deficit As related to obesity.  As evidenced by BMI og 40.53 kg/m2, and no prior nutrition education.   NUTRITION INTERVENTION  Nutrition education (E-1) on the following topics:  Educated patient on the importance of achieving balanced nutrition throughout the day. Recommended lunch and dinner be 1/2 non-starchy vegetables, 1/4 starches, and 1/4 protein. Recommended breakfast be a balance of starch and protein with a piece of fruit. Discussed with patient the importance of working towards consistently balanced nutrition on weight loss. Advised patient to work towards eating meals consistently spaced apart during the day.   Handouts Provided Include   N/A  Learning Style & Readiness for Change Teaching method utilized: Visual & Auditory  Demonstrated degree of understanding via: Teach Back  Barriers to learning/adherence to lifestyle change: Work Public librarian Established by [employer]  Work up to using your row machine for 30 minutes every other day before work!  Take your choice of fruit to work to have with your premier protein shake.  Consider trying a wrap for a work lunch. Talk to your wife and daughter about different options for flavors you might like.  Enjoy trying the plant based food items that your daughter has prepared!   MONITORING & EVALUATION Dietary intake, weekly physical activity, and weight in 2 weeks.  Next Steps  Patient is to follow up with RD.

## 2020-06-11 NOTE — Patient Instructions (Addendum)
Work up to using your row machine for 30 minutes every other day before work!   Take your choice of fruit to work to have with your premier protein shake.  Consider trying a wrap for a work lunch. Talk to your wife and daughter about different options for flavors you might like.  Enjoy trying the plant based food items that your daughter has prepared!

## 2020-06-21 ENCOUNTER — Other Ambulatory Visit: Payer: Self-pay

## 2020-06-21 ENCOUNTER — Encounter: Payer: Self-pay | Admitting: Physical Therapy

## 2020-06-21 ENCOUNTER — Ambulatory Visit: Payer: 59 | Attending: Neurology | Admitting: Physical Therapy

## 2020-06-21 DIAGNOSIS — M545 Low back pain, unspecified: Secondary | ICD-10-CM | POA: Diagnosis not present

## 2020-06-21 NOTE — Therapy (Signed)
[place], [place], [address] Phone: [phone]   Fax:  [phone]  Physical Therapy Treatment  Patient Details  Name: Carl Knox MRN: [id number] Date of Birth: November 18, 1965 Referring Provider (PT): Margette Fast, MD   Encounter Date: 06/21/2020   PT End of Session - 06/21/20 0813    Visit Number 4    Number of Visits 10    Date for PT Re-Evaluation 07/26/20    Authorization Type recheck FOTO by visit 6 and 10th visit    PT Start Time 0811    PT Stop Time 0859    PT Time Calculation (min) 48 min    Activity Tolerance Patient tolerated treatment well    Behavior During Therapy [facility] for tasks assessed/performed           Past Medical History:  Diagnosis Date  . Hypothyroidism   . Morbid obesity ([place])   . OSA on CPAP   . Other specified disorders of thyroid   . Papilledema   . Pseudotumor cerebri   . Sickle cell trait ([place])   . Syncope and collapse 02/06/2016    Past Surgical History:  Procedure Laterality Date  . CYST EXCISION  2001   Thyglossal duct  . TONSILLECTOMY  1980    There were no vitals filed for this visit.   Subjective Assessment - 06/21/20 0813    Subjective " The Dn helped last time, and I didn't have much soreness after. I am stiff this morning and pain is rated 2/10."    Diagnostic tests MRI 02/28/2020 IMPRESSION:  Lumbar spondylosis as outlined and having progressed at several  levels since the prior MRI of 12/26/2018. Findings are most notably  as follows.     At L4-L5, there is a minimal disc bulge and endplate spurring. New  superimposed small right subarticular disc protrusion. Mild facet  arthrosis. The disc protrusion contributes to mild right  subarticular narrowing, contacting the descending right L5 nerve  root. Correlate for right L5 radiculopathy. Mild bilateral neural  foraminal narrowing is unchanged.     At L1-L2, there is a disc bulge. Superimposed broad-based left   center to left foraminal disc protrusion, slightly progressed. Mild  relative left subarticular and central canal narrowing. Mild left  inferior neural foraminal narrowing.     New L3-L4 small left foraminal/extraforaminal disc protrusion  contributing to mild left neural foraminal narrowing.     Additional sites of mild neural foraminal narrowing as described.     Also of note, there are trace bilateral facet joint effusions at  L2-L3, L3-L4 and L4-L5.    Currently in Pain? Yes    Pain Score 2     Pain Type Chronic pain    Pain Onset More than a month ago    Pain Frequency Intermittent    Aggravating Factors  getting up in the morning.              [facility] PT Assessment - 06/21/20 0001      Assessment   Medical Diagnosis Acute right-sided LBP    Referring Provider (PT) Margette Fast, MD                         [facility] Adult PT Treatment/Exercise - 06/21/20 0001      Lumbar Exercises: Stretches   Lower Trunk Rotation --   1 x 20   Piriformis Stretch 2 reps;Right;30 seconds      Lumbar  Exercises: Aerobic   Nustep L7 x 5 min UE/LE      Lumbar Exercises: Standing   Other Standing Lumbar Exercises palloff press 1 x 10 bil with 10#, 1 x 10 bil with 17#      Lumbar Exercises: Supine   Bent Knee Raise 20 reps   maintaining PPT and keeping core tight x 1 set   Dead Bug 5 reps   10 sec hold     Lumbar Exercises: Prone   Other Prone Lumbar Exercises prone press up 1 x 15, prone press up with belly sag 1 x 15      Manual Therapy   Manual Therapy Joint mobilization    Manual therapy comments skilled palpation and monitoring of pt throughout PTDN    Joint Mobilization PA grade IV L1-L5    Soft tissue mobilization IASTM along R lumber parapsinals            Trigger Point Dry Needling - 06/21/20 0001    Consent Given? Yes    Education Handout Provided Previously provided    Lumbar multifidi Response Twitch response elicited;Palpable increased muscle length   R only  L1- L4               PT Education - 06/21/20 0858    Education Details Reviewed HEP and updated today to include dead bug    Person(s) Educated Patient    Methods Explanation;Verbal cues;Handout    Comprehension Verbalized understanding;Verbal cues required               PT Long Term Goals - 05/31/20 1131      PT LONG TERM GOAL #1   Title Independent with HEP    Period Weeks    Status On-going    Target Date 07/12/20      PT LONG TERM GOAL #2   Title Improve FOTO outcome measure score to 78% or greater functional status    Period Weeks    Status Partially Met    Target Date 07/12/20      PT LONG TERM GOAL #3   Title Increase hip flexion AROM 5-10 deg bilat. to improve ability to donn shoes    Period Weeks    Status Achieved    Target Date 07/12/20      PT LONG TERM GOAL #4   Title Tolerate seated positions and sit<>stand with LBP symptoms/pain decreased 60% or greater from baseline status    Period Weeks    Status Achieved      PT LONG TERM GOAL #5   Title Increase bilat. hip extension and right hip abduction strength at least 1/2 MMT grade from baseline status to improve ability for transfers and lifting activities for work    Period Weeks    Target Date 07/12/20                 Plan - 06/21/20 0826    Clinical Impression Statement pt reports only 2/10 pain today noted its related to being more stiff in the AM and notes it typically improves with activity throughout the day. continued TPDN focusing onthe R lumbar multifiid followed with IASTM techniques and mobs. continued working on extension bias progressing prone press up, He did well with exercise today focusing primarily on core activation.  He reported no pain following session, If he continues to do well then anticipate potential discharge in the next 1-2 visits.    PT Treatment/Interventions ADLs/Self Care Home Management;Cryotherapy;Traction;Electrical Stimulation;Moist Heat;Therapeutic  exercise;Neuromuscular re-education;Functional  mobility training;Therapeutic activities;Taping;Dry needling;Spinal Manipulations;Manual techniques;Patient/family education    PT Next Visit Plan continue dry needling as found beneficial,  continue extension bias ROM and lumbar/core strengthening, core strengthening    PT Home Exercise Plan Access code: [email or web address]    Consulted and Agree with Plan of Care Patient           Patient will benefit from skilled therapeutic intervention in order to improve the following deficits and impairments:  Pain,Impaired flexibility,Decreased strength,Increased muscle spasms,Decreased range of motion,Hypomobility  Visit Diagnosis: Acute right-sided low back pain without sciatica     Problem List Patient Active Problem List   Diagnosis Date Noted  . Vitamin D deficiency 09/25/2019  . Class 3 severe obesity with serious comorbidity and body mass index (BMI) of 40.0 to 44.9 in adult ([place]) 09/25/2019  . Papilledema 07/11/2018  . Obstructive sleep apnea on CPAP 10/12/2016  . Pseudotumor cerebri 07/09/2016  . History of obstructive sleep apnea 07/09/2016  . Syncope and collapse 02/06/2016   Starr Lake PT, DPT, LAT, ATC  06/21/20  9:00 AM      [facility] [address] [place], [place], [address] Phone: [phone]   Fax:  [phone]  Name: Jabri Blancett MRN: [id number] Date of Birth: 1965/04/02

## 2020-06-24 ENCOUNTER — Encounter: Payer: 59 | Attending: Internal Medicine | Admitting: Dietician

## 2020-06-24 ENCOUNTER — Encounter: Payer: Self-pay | Admitting: Dietician

## 2020-06-24 ENCOUNTER — Other Ambulatory Visit: Payer: Self-pay

## 2020-06-24 DIAGNOSIS — E669 Obesity, unspecified: Secondary | ICD-10-CM | POA: Insufficient documentation

## 2020-06-24 DIAGNOSIS — Z6841 Body Mass Index (BMI) 40.0 and over, adult: Secondary | ICD-10-CM | POA: Insufficient documentation

## 2020-06-24 NOTE — Progress Notes (Signed)
Medical Nutrition Therapy  Appointment Start time:  1700  Appointment End time:  1730  Primary concerns today: Weight loss  Referral diagnosis: N/A Preferred learning style: No preference indicated Learning readiness: Ready   NUTRITION ASSESSMENT   Anthropometrics  Ht: 5'11" Wt: 289.9 lbs Body mass index is 40.43 kg/m.   Clinical Medical Hx: OSA Medications: Synthroid, NEW: Gabapentin Labs: N/A Notable Signs/Symptoms: Central adiposity  Lifestyle & Dietary Hx Employee Wellness Visit 2 of 3 Spouse of Employee EID: [id number]   Pt reports taking a protein bar to work to eat, taking 2 bottles of water to work. Pt has been eating 3 times a day, twice while at home and a protein supplement at work during the week. On off days, pt eats 3 regular meals a day.  No GI issues from dietary changes, pt is concerned about having GI problems while working. Pt reports going to the chiropractor for a back adjustment that ended up causing severe back pain and numbness in their legs and feet that kept them out of work for a week. Pt started gabapentin yesterday, noticed improvements in pain and feeling in toes. Pt will be getting another steroid shot in a week. Pt reports only time that they get full relief from the pain is when laying down on their back to sleep. Pt daughter is moving back in and will be preparing a lot of plant based health foods.   Estimated daily fluid intake: 64 oz Supplements: Multivtiamin Sleep: Sleeps well, two separate bouts of sleep Stress / self-care: 3/10, short terms  Current average weekly physical activity: ADL, row machine   24-Hr Dietary Recall First Meal: 2 eggs, 2 pieces of toast, yogurt parfait w strawberries and blueberries, 2 cups of coffee Snack: none Second Meal: Fried fish, green beans, small piece of cake Snack: none Third Meal:  Snack: none Beverages: coffee, water   NUTRITION DIAGNOSIS  NB-1.1 Food and nutrition-related knowledge deficit As  related to obesity.  As evidenced by BMI og 40.53 kg/m2, and no prior nutrition education.   NUTRITION INTERVENTION  Nutrition education (E-1) on the following topics:  Educated patient on the importance of achieving balanced nutrition throughout the day. Recommended lunch and dinner be 1/2 non-starchy vegetables, 1/4 starches, and 1/4 protein. Recommended breakfast be a balance of starch and protein with a piece of fruit. Discussed with patient the importance of working towards consistently balanced nutrition on weight loss. Advised patient to work towards eating meals consistently spaced apart during the day.   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  Keep on taking your protein bar or Fairlife drink to work.  Be adventurous trying the new food that your daughter will be preparing.  Try new dietary changes on your days off to assess your gastrointestinal tolerance.   MONITORING & EVALUATION Dietary intake, weekly physical activity, and weight in 1 week.  Next Steps  Patient is to follow up with RD.

## 2020-06-24 NOTE — Patient Instructions (Addendum)
Choose [place] Carb Balance or [place] Carb Counter tortillas for low calorie, high fiber when you make your wraps or burritos.  Keep on taking your protein bar or Fairlife drink to work.  Be adventurous trying the new food that your daughter will be preparing.  Try new dietary changes on your days off to assess your gastrointestinal tolerance.

## 2020-06-28 ENCOUNTER — Other Ambulatory Visit (HOSPITAL_COMMUNITY): Payer: Self-pay

## 2020-06-28 ENCOUNTER — Other Ambulatory Visit: Payer: Self-pay

## 2020-06-28 ENCOUNTER — Ambulatory Visit: Payer: 59 | Admitting: Physical Therapy

## 2020-06-28 ENCOUNTER — Encounter: Payer: Self-pay | Admitting: Physical Therapy

## 2020-06-28 DIAGNOSIS — M545 Low back pain, unspecified: Secondary | ICD-10-CM | POA: Diagnosis not present

## 2020-06-28 MED FILL — Gabapentin Cap 300 MG: ORAL | 20 days supply | Qty: 60 | Fill #0 | Status: AC

## 2020-06-28 NOTE — Therapy (Signed)
[place], [place], [address] Phone: [phone]   Fax:  [phone]  Physical Therapy Treatment  Patient Details  Name: Carl Knox MRN: [id number] Date of Birth: [birth year] Referring Provider (PT): Margette Fast, MD   Encounter Date: 06/28/2020   PT End of Session - 06/28/20 0815     Visit Number 5    Number of Visits 10    Date for PT Re-Evaluation 07/26/20    Authorization Type recheck FOTO by visit 6 and 10th visit    PT Start Time 0815    PT Stop Time 0900    PT Time Calculation (min) 45 min    Activity Tolerance Patient tolerated treatment well    Behavior During Therapy [facility] for tasks assessed/performed             Past Medical History:  Diagnosis Date   Hypothyroidism    Morbid obesity ([place])    OSA on CPAP    Other specified disorders of thyroid    Papilledema    Pseudotumor cerebri    Sickle cell trait ([place])    Syncope and collapse 02/06/2016    Past Surgical History:  Procedure Laterality Date   CYST EXCISION  2001   Thyglossal duct   TONSILLECTOMY  1980    There were no vitals filed for this visit.   Subjective Assessment - 06/28/20 0816     Subjective "The job has been requiring more lifting, and I had to help my daughter move up to 1am. . After the last treatment I was in alot of pain. I was out of work for 2 days because having issues with altered sensation in the R foot. He had to start taking gabapentin on Monday. He notes the N/T has since resolvedHe reports he hasn't been consistent with his HEP. He states all the activities naturally to resolve the pain.    Diagnostic tests MRI 02/28/2020 IMPRESSION:  Lumbar spondylosis as outlined and having progressed at several  levels since the prior MRI of 12/26/2018. Findings are most notably  as follows.     At L4-L5, there is a minimal disc bulge and endplate spurring. New  superimposed small right subarticular disc protrusion.  Mild facet  arthrosis. The disc protrusion contributes to mild right  subarticular narrowing, contacting the descending right L5 nerve  root. Correlate for right L5 radiculopathy. Mild bilateral neural  foraminal narrowing is unchanged.     At L1-L2, there is a disc bulge. Superimposed broad-based left  center to left foraminal disc protrusion, slightly progressed. Mild  relative left subarticular and central canal narrowing. Mild left  inferior neural foraminal narrowing.     New L3-L4 small left foraminal/extraforaminal disc protrusion  contributing to mild left neural foraminal narrowing.     Additional sites of mild neural foraminal narrowing as described.     Also of note, there are trace bilateral facet joint effusions at  L2-L3, L3-L4 and L4-L5.    Currently in Pain? Yes    Pain Score 3     Pain Location Back    Pain Orientation Right;Lower                [facility] PT Assessment - 06/28/20 0001       Assessment   Medical Diagnosis Acute right-sided LBP    Referring Provider (PT) Margette Fast, MD  [facility] PT Treatment/Exercise - 06/28/20 0001       Lumbar Exercises: Stretches   Piriformis Stretch Right;2 reps;30 seconds      Lumbar Exercises: Aerobic   Nustep L6 x 5 min UE/LE      Lumbar Exercises: Seated   Hip Flexion on Ball Strengthening;Both;20 reps   seated on dyna disc     Lumbar Exercises: Supine   Pelvic Tilt --   PPT with ab set 1 x 10 holding 10 sec   Bent Knee Raise 10 reps   maintinain PPT with abset     Manual Therapy   Manual Therapy Manual Traction    Manual therapy comments MTPR focusing on R lumbar parapsinals and R piriformis   taught how it can be done at home with theracane   Manual Traction utilzing lumbar decompression pos                    PT Education - 06/28/20 0831     Education Details how to perform self trigger point release, and how ti can be performed at home and tools to assist  with treatment.    Person(s) Educated Patient    Methods Explanation;Verbal cues;Handout    Comprehension Verbalized understanding;Verbal cues required                 PT Long Term Goals - 05/31/20 1131       PT LONG TERM GOAL #1   Title Independent with HEP    Period Weeks    Status On-going    Target Date 07/12/20      PT LONG TERM GOAL #2   Title Improve FOTO outcome measure score to 78% or greater functional status    Period Weeks    Status Partially Met    Target Date 07/12/20      PT LONG TERM GOAL #3   Title Increase hip flexion AROM 5-10 deg bilat. to improve ability to donn shoes    Period Weeks    Status Achieved    Target Date 07/12/20      PT LONG TERM GOAL #4   Title Tolerate seated positions and sit<>stand with LBP symptoms/pain decreased 60% or greater from baseline status    Period Weeks    Status Achieved      PT LONG TERM GOAL #5   Title Increase bilat. hip extension and right hip abduction strength at least 1/2 MMT grade from baseline status to improve ability for transfers and lifting activities for work    Period Weeks    Target Date 07/12/20                   Plan - 06/28/20 0901     Clinical Impression Statement Mr Panuco returns to PT since the last session noting he had increase in low back pain and N/t into the R foot which resulted in him calling out of work and taking more medication. today he reports the RLE symptoms are better but notes the low back pain at 3/10 today. held off on TPDN focusing on MTrP which he responded favorably to. continued working on core strengthening focusing on basic back positioning and ab set to reduce torque of the back. trialed manual traction which he did well with noting not pain at end of session.    PT Treatment/Interventions ADLs/Self Care Home Management;Cryotherapy;Traction;Electrical Stimulation;Moist Heat;Therapeutic exercise;Neuromuscular re-education;Functional mobility  training;Therapeutic activities;Taping;Dry needling;Spinal Manipulations;Manual techniques;Patient/family education    PT Next Visit Plan  continue dry needling as found beneficial,  continue extension bias ROM and lumbar/core strengthening, core strengthening. trial mechanical traction.    PT Home Exercise Plan Access code: [email or web address]    Consulted and Agree with Plan of Care Patient             Patient will benefit from skilled therapeutic intervention in order to improve the following deficits and impairments:  Pain, Impaired flexibility, Decreased strength, Increased muscle spasms, Decreased range of motion, Hypomobility  Visit Diagnosis: Acute right-sided low back pain without sciatica     Problem List Patient Active Problem List   Diagnosis Date Noted   Vitamin D deficiency 09/25/2019   Class 3 severe obesity with serious comorbidity and body mass index (BMI) of 40.0 to 44.9 in adult ([place]) 09/25/2019   Papilledema 07/11/2018   Obstructive sleep apnea on CPAP 10/12/2016   Pseudotumor cerebri 07/09/2016   History of obstructive sleep apnea 07/09/2016   Syncope and collapse 02/06/2016   Starr Lake PT, DPT, LAT, ATC  06/28/20  9:08 AM      [facility] [address] [place], [place], [address] Phone: [phone]   Fax:  [phone]  Name: Carl Knox MRN: [id number] Date of Birth: 1966-01-11

## 2020-06-30 ENCOUNTER — Ambulatory Visit: Payer: 59 | Admitting: Physical Therapy

## 2020-06-30 ENCOUNTER — Telehealth: Payer: Self-pay | Admitting: Physical Therapy

## 2020-06-30 NOTE — Telephone Encounter (Signed)
LVM regarding missed appointment that was scheduled today. I reviewed when his next scheduled appointment day/ time is and if he cannot make that appointment to call and we can work to cancel or reschedule that appointment.  Kemarion Abbey PT, DPT, LAT, ATC  06/30/20  4:52 PM

## 2020-07-01 ENCOUNTER — Encounter: Payer: 59 | Admitting: Dietician

## 2020-07-01 ENCOUNTER — Other Ambulatory Visit: Payer: Self-pay

## 2020-07-01 ENCOUNTER — Encounter: Payer: Self-pay | Admitting: Dietician

## 2020-07-01 VITALS — Ht 71.0 in | Wt 290.4 lb

## 2020-07-01 DIAGNOSIS — E669 Obesity, unspecified: Secondary | ICD-10-CM

## 2020-07-01 DIAGNOSIS — M5416 Radiculopathy, lumbar region: Secondary | ICD-10-CM | POA: Diagnosis not present

## 2020-07-01 NOTE — Progress Notes (Signed)
Medical Nutrition Therapy  Appointment Start time:  1700  Appointment End time:  1730  Primary concerns today: Weight loss  Referral diagnosis: N/A Preferred learning style: No preference indicated Learning readiness: Ready   NUTRITION ASSESSMENT   Anthropometrics  Ht: 5'11" Wt: 290.4 lbs Body mass index is 40.5 kg/m.   Clinical Medical Hx: OSA Medications: Synthroid, Gabapentin Labs: N/A Notable Signs/Symptoms: Central adiposity  Lifestyle & Dietary Hx Employee Wellness Visit 3 of 3 Spouse of Employee EID: [id number]   Pt reports getting a cortisone shot this morning, no effects felt yet. Pt will be starting traction physical therapy. Pt has been helping their daughter move this past weekend as well, was able to take it lightly with no pain. Pt tried a high fiber tortilla wrap with [place] and cheese and enjoyed it. Will be taking one to work to eat daily. Pt reports a wide variety of dietary concerns in their household that get in the way of them making dietary changes.  Pt is looking forward to trying new plant based options.   Estimated daily fluid intake: 64 oz Supplements: Multivtiamin Sleep: Sleeps well, two separate bouts of sleep Stress / self-care: 3/10, short terms  Current average weekly physical activity: ADL, row machine   24-Hr Dietary Recall First Meal: [place] bacon, 2 eggs, and cheese sandwich on low calorie bread, coffee Snack: None Second Meal: 2 [place] and cheese wraps, diet dr pepper Snack:  Third Meal: 3 Hebrew hotdogs on buns, mustard, frozen grapes Snack:  Beverages: Coffee, diet dr. Malachi Bonds, water   NUTRITION DIAGNOSIS  NB-1.1 Food and nutrition-related knowledge deficit As related to obesity.  As evidenced by BMI og 40.53 kg/m2, and no prior nutrition education.   NUTRITION INTERVENTION  Nutrition education (E-1) on the following topics:  Educated patient on the importance of achieving balanced nutrition throughout the day. Recommended  lunch and dinner be 1/2 non-starchy vegetables, 1/4 starches, and 1/4 protein. Recommended breakfast be a balance of starch and protein with a piece of fruit. Discussed with patient the importance of working towards consistently balanced nutrition on weight loss. Advised patient to work towards eating meals consistently spaced apart during the day.   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 a wrap, mixed nut protein bar, and a protein drink with you to work each day.  Take 64 oz of water with you to work each day. Continue to try to eat a meal or snack consistently each day!   MONITORING & EVALUATION Dietary intake, weekly physical activity, and weight PRN  Next Steps  Patient is to follow up with RD as needed.

## 2020-07-01 NOTE — Patient Instructions (Signed)
Bring a wrap, mixed nut protein bar, and a protein drink with you to work each day.   Take 64 oz of water with you to work each day

## 2020-07-02 ENCOUNTER — Ambulatory Visit: Payer: 59 | Admitting: Physical Therapy

## 2020-07-02 ENCOUNTER — Encounter: Payer: Self-pay | Admitting: Physical Therapy

## 2020-07-02 DIAGNOSIS — M545 Low back pain, unspecified: Secondary | ICD-10-CM

## 2020-07-02 NOTE — Therapy (Signed)
[place], [place], [address] Phone: [phone]   Fax:  [phone]  Physical Therapy Treatment  Patient Details  Name: Carl Knox MRN: [id number] Date of Birth: 1965/08/15 Referring Provider (PT): Margette Fast, MD   Encounter Date: 07/02/2020   PT End of Session - 07/02/20 1631     Visit Number 6    Number of Visits 10    Date for PT Re-Evaluation 07/26/20    Authorization Type recheck FOTO by visit 6 and 10th visit    PT Start Time 1630    PT Stop Time 1715    PT Time Calculation (min) 45 min    Activity Tolerance Patient tolerated treatment well    Behavior During Therapy [facility] for tasks assessed/performed             Past Medical History:  Diagnosis Date   Hypothyroidism    Morbid obesity ([place])    OSA on CPAP    Other specified disorders of thyroid    Papilledema    Pseudotumor cerebri    Sickle cell trait ([place])    Syncope and collapse 02/06/2016    Past Surgical History:  Procedure Laterality Date   CYST EXCISION  2001   Thyglossal duct   TONSILLECTOMY  1980    There were no vitals filed for this visit.   Subjective Assessment - 07/02/20 1633     Subjective "After the last session I am doing better, I did get the shot in the back yesterday. He discussed that the MD stated they are going to continue with treatment and if they he didn't see any benefit then they may plan to see a dose increase."    Currently in Pain? Yes    Pain Score 3     Pain Location Back    Pain Orientation Right;Lower    Pain Descriptors / Indicators Aching    Pain Type Chronic pain    Pain Onset More than a month ago    Pain Frequency Intermittent    Aggravating Factors  getting up in the morning, sitting in the hard chair                [facility] PT Assessment - 07/02/20 0001       Assessment   Medical Diagnosis Acute right-sided LBP    Referring Provider (PT) Margette Fast, MD                            [facility] Adult PT Treatment/Exercise - 07/02/20 0001       Lumbar Exercises: Aerobic   Nustep L7 x 5 min UE/LE      Lumbar Exercises: Seated   Hip Flexion on Ball Strengthening;Both;20 reps   alternating L/R cues to keep core tight   Other Seated Lumbar Exercises palloff press seated on dyna disc 1 x 15 with green theraband bil    Other Seated Lumbar Exercises anterior pelvic tilt seated on dyna disc 1 x 10 holding 10 seconds      Lumbar Exercises: Supine   Bent Knee Raise 10 reps   x 2 sets with ab set     Modalities   Modalities Traction      Traction   Type of Traction Lumbar    Min (lbs) 120    Max (lbs) 140    Hold Time 60    Rest Time 20    Time 15  Manual Therapy   Manual therapy comments MTPR along R lumbar parapsinals    Joint Mobilization PA grade IV L1-L5                         PT Long Term Goals - 05/31/20 1131       PT LONG TERM GOAL #1   Title Independent with HEP    Period Weeks    Status On-going    Target Date 07/12/20      PT LONG TERM GOAL #2   Title Improve FOTO outcome measure score to 78% or greater functional status    Period Weeks    Status Partially Met    Target Date 07/12/20      PT LONG TERM GOAL #3   Title Increase hip flexion AROM 5-10 deg bilat. to improve ability to donn shoes    Period Weeks    Status Achieved    Target Date 07/12/20      PT LONG TERM GOAL #4   Title Tolerate seated positions and sit<>stand with LBP symptoms/pain decreased 60% or greater from baseline status    Period Weeks    Status Achieved      PT LONG TERM GOAL #5   Title Increase bilat. hip extension and right hip abduction strength at least 1/2 MMT grade from baseline status to improve ability for transfers and lifting activities for work    Period Weeks    Target Date 07/12/20                   Plan - 07/02/20 1652     Clinical Impression Statement pt reports he saw the MD yesterday and  received injection for the low back. trialed lumbar mechanica traction today which he did report relief of pain and tension in the R low back. continued working on core strengthening in seated position working on good form. end of session he noted    PT Next Visit Plan continue dry needling PRN, continue extension bias ROM and lumbar/core strengthening, core strengthening. response to mechanical traction.             Patient will benefit from skilled therapeutic intervention in order to improve the following deficits and impairments:     Visit Diagnosis: Acute right-sided low back pain without sciatica     Problem List Patient Active Problem List   Diagnosis Date Noted   Vitamin D deficiency 09/25/2019   Class 3 severe obesity with serious comorbidity and body mass index (BMI) of 40.0 to 44.9 in adult [facility]) 09/25/2019   Papilledema 07/11/2018   Obstructive sleep apnea on CPAP 10/12/2016   Pseudotumor cerebri 07/09/2016   History of obstructive sleep apnea 07/09/2016   Syncope and collapse 02/06/2016   Starr Lake PT, DPT, LAT, ATC  07/02/20  5:25 PM      [facility] [address] [place], [place], [address] Phone: [phone]   Fax:  [phone]  Name: Carl Knox MRN: [id number] Date of Birth: 1965/04/11

## 2020-07-04 ENCOUNTER — Other Ambulatory Visit (HOSPITAL_COMMUNITY): Payer: Self-pay

## 2020-07-08 ENCOUNTER — Ambulatory Visit: Payer: 59

## 2020-07-08 ENCOUNTER — Other Ambulatory Visit: Payer: Self-pay

## 2020-07-08 DIAGNOSIS — M545 Low back pain, unspecified: Secondary | ICD-10-CM

## 2020-07-08 NOTE — Therapy (Signed)
[place], [place], [address] Phone: [phone]   Fax:  [phone]  Physical Therapy Treatment  Patient Details  Name: Forbes Loll MRN: [id number] Date of Birth: 01/18/1966 Referring Provider (PT): Margette Fast, MD   Encounter Date: 07/08/2020   PT End of Session - 07/08/20 1911     Visit Number 7    Number of Visits 10    Date for PT Re-Evaluation 07/26/20    Authorization Type recheck FOTO by visit 6 and 10th visit    PT Start Time 1830    PT Stop Time 1910    PT Time Calculation (min) 40 min    Activity Tolerance Patient tolerated treatment well    Behavior During Therapy [facility] for tasks assessed/performed             Past Medical History:  Diagnosis Date   Hypothyroidism    Morbid obesity ([place])    OSA on CPAP    Other specified disorders of thyroid    Papilledema    Pseudotumor cerebri    Sickle cell trait ([place])    Syncope and collapse 02/06/2016    Past Surgical History:  Procedure Laterality Date   CYST EXCISION  2001   Thyglossal duct   TONSILLECTOMY  1980    There were no vitals filed for this visit.   Subjective Assessment - 07/08/20 1828     Subjective Pt reports feeling decreased lumbar pain since starting PT and receiving a cortisone shot last week. He adds that he has been able to remain adherent to his HEP.    Pertinent History sickle cell trait, right ankle injury, syncope (last episode 2020), obesity, pseudotumor cerebri, see PMH    Limitations Sitting    Diagnostic tests MRI 02/28/2020 IMPRESSION:  Lumbar spondylosis as outlined and having progressed at several  levels since the prior MRI of 12/26/2018. Findings are most notably  as follows.     At L4-L5, there is a minimal disc bulge and endplate spurring. New  superimposed small right subarticular disc protrusion. Mild facet  arthrosis. The disc protrusion contributes to mild right  subarticular narrowing, contacting  the descending right L5 nerve  root. Correlate for right L5 radiculopathy. Mild bilateral neural  foraminal narrowing is unchanged.     At L1-L2, there is a disc bulge. Superimposed broad-based left  center to left foraminal disc protrusion, slightly progressed. Mild  relative left subarticular and central canal narrowing. Mild left  inferior neural foraminal narrowing.     New L3-L4 small left foraminal/extraforaminal disc protrusion  contributing to mild left neural foraminal narrowing.     Additional sites of mild neural foraminal narrowing as described.     Also of note, there are trace bilateral facet joint effusions at  L2-L3, L3-L4 and L4-L5.    Patient Stated Goals Get back better    Currently in Pain? Yes    Pain Score 1     Pain Location Back    Pain Orientation Right;Lower    Pain Descriptors / Indicators Aching;Dull    Pain Type Chronic pain    Pain Radiating Towards None    Pain Onset More than a month ago    Pain Frequency Intermittent                               [facility] Adult PT Treatment/Exercise - 07/08/20 0001       Lumbar  Exercises: Standing   Other Standing Lumbar Exercises Pallof press 2x10 with 10#      Lumbar Exercises: Supine   Dead Bug --   2x20 with green physioball   Bridge with Diona Foley Squeeze --   2x10     Lumbar Exercises: Sidelying   Other Sidelying Lumbar Exercises Side knee plank with clamshells 2x10 BIL      Manual Therapy   Manual therapy comments MTPR along R lumbar parapsinals                    PT Education - 07/08/20 1911     Education Details Pt educated on proper form when performing newly added home exercises.    Person(s) Educated Patient    Methods Explanation;Verbal cues;Handout    Comprehension Verbal cues required;Verbalized understanding                 PT Long Term Goals - 05/31/20 1131       PT LONG TERM GOAL #1   Title Independent with HEP    Period Weeks    Status On-going    Target  Date 07/12/20      PT LONG TERM GOAL #2   Title Improve FOTO outcome measure score to 78% or greater functional status    Period Weeks    Status Partially Met    Target Date 07/12/20      PT LONG TERM GOAL #3   Title Increase hip flexion AROM 5-10 deg bilat. to improve ability to donn shoes    Period Weeks    Status Achieved    Target Date 07/12/20      PT LONG TERM GOAL #4   Title Tolerate seated positions and sit<>stand with LBP symptoms/pain decreased 60% or greater from baseline status    Period Weeks    Status Achieved      PT LONG TERM GOAL #5   Title Increase bilat. hip extension and right hip abduction strength at least 1/2 MMT grade from baseline status to improve ability for transfers and lifting activities for work    Period Weeks    Target Date 07/12/20                   Plan - 07/08/20 1913     Clinical Impression Statement Pt responded well to all treatment today, demonstrating ability to perform all exercises with proper form and without increase in pain. He also reports a therapeutic response to manual therapy to close out the session. He will continue to benefit from skilled PT to address his primary impairments and help him return to his prior level of function without limitation.    Personal Factors and Comorbidities Comorbidity 3+    Comorbidities see PMH    Examination-Activity Limitations Dressing;Sit    Stability/Clinical Decision Making Evolving/Moderate complexity    Clinical Decision Making Moderate    Rehab Potential Good    PT Frequency 1x / week    PT Duration 8 weeks    PT Treatment/Interventions ADLs/Self Care Home Management;Cryotherapy;Traction;Electrical Stimulation;Moist Heat;Therapeutic exercise;Neuromuscular re-education;Functional mobility training;Therapeutic activities;Taping;Dry needling;Spinal Manipulations;Manual techniques;Patient/family education    PT Next Visit Plan continue extension bias ROM and lumbar/core strengthening,  core strengthening. response to mechanical traction.    PT Home Exercise Plan Access code: [email or web address]    Consulted and Agree with Plan of Care Patient             Patient will benefit from skilled therapeutic intervention in order to improve  the following deficits and impairments:  Pain, Impaired flexibility, Decreased strength, Increased muscle spasms, Decreased range of motion, Hypomobility  Visit Diagnosis: Acute right-sided low back pain without sciatica     Problem List Patient Active Problem List   Diagnosis Date Noted   Vitamin D deficiency 09/25/2019   Class 3 severe obesity with serious comorbidity and body mass index (BMI) of 40.0 to 44.9 in adult [facility]) 09/25/2019   Papilledema 07/11/2018   Obstructive sleep apnea on CPAP 10/12/2016   Pseudotumor cerebri 07/09/2016   History of obstructive sleep apnea 07/09/2016   Syncope and collapse 02/06/2016    Vanessa [place], PT, DPT 07/08/20 7:16 PM   [place] [facility] [address] [place], [place], [address] Phone: [phone]   Fax:  [phone]  Name: Dardan Shelton MRN: [id number] Date of Birth: 1965-10-23

## 2020-07-08 NOTE — Patient Instructions (Signed)
  [email or web address]

## 2020-07-15 ENCOUNTER — Ambulatory Visit: Payer: 59

## 2020-07-15 ENCOUNTER — Other Ambulatory Visit: Payer: Self-pay

## 2020-07-15 DIAGNOSIS — H0510 Unspecified chronic inflammatory disorders of orbit: Secondary | ICD-10-CM | POA: Diagnosis not present

## 2020-07-15 DIAGNOSIS — M545 Low back pain, unspecified: Secondary | ICD-10-CM | POA: Diagnosis not present

## 2020-07-15 DIAGNOSIS — D573 Sickle-cell trait: Secondary | ICD-10-CM | POA: Diagnosis not present

## 2020-07-15 NOTE — Therapy (Signed)
[place], [place], [address] Phone: [phone]   Fax:  [phone]  Physical Therapy Treatment  Patient Details  Name: Carl Knox MRN: [id number] Date of Birth: 1965-11-03 Referring Provider (PT): Margette Fast, MD   Encounter Date: 07/15/2020   PT End of Session - 07/15/20 1901     Visit Number 8    Number of Visits 10    Date for PT Re-Evaluation 07/26/20    Authorization Type recheck FOTO by visit 6 and 10th visit    PT Start Time 1830    PT Stop Time 1910    PT Time Calculation (min) 40 min    Activity Tolerance Patient tolerated treatment well    Behavior During Therapy [facility] for tasks assessed/performed             Past Medical History:  Diagnosis Date   Hypothyroidism    Morbid obesity ([place])    OSA on CPAP    Other specified disorders of thyroid    Papilledema    Pseudotumor cerebri    Sickle cell trait ([place])    Syncope and collapse 02/06/2016    Past Surgical History:  Procedure Laterality Date   CYST EXCISION  2001   Thyglossal duct   TONSILLECTOMY  1980    There were no vitals filed for this visit.   Subjective Assessment - 07/15/20 1831     Subjective Pt reports that his low back pain has been feeling better. He decided to defer mechanical traction today due to "not feeling like I need it." He states he hasn't been doing his HEP, other than his stretches. He reports he just wants to let his recent cortisone shot "do its job."    Currently in Pain? No/denies    Pain Score 0-No pain                [facility] PT Assessment - 07/15/20 0001       Observation/Other Assessments   Focus on Therapeutic Outcomes (FOTO)  73% taken at previous visit                           [facility] Adult PT Treatment/Exercise - 07/15/20 0001       Lumbar Exercises: Standing   Other Standing Lumbar Exercises Standing wood chops at cable column with 20# 2x10 BIL      Lumbar  Exercises: Supine   Large Ball Abdominal Isometric --   3x30sec with green physioball   Other Supine Lumbar Exercises Lumbar rotation (windshield wipers) 2x10 BIL    Other Supine Lumbar Exercises Knee to chest 2x10      Lumbar Exercises: Prone   Other Prone Lumbar Exercises prone press up 1 x 15, prone press up with belly sag 1 x 15      Lumbar Exercises: Quadruped   Madcat/Old Horse --   2x10   Other Quadruped Lumbar Exercises Bird dogs with elbow-knee taps 2x10 BIL                    PT Education - 07/15/20 1900     Education Details Pt educated on proper form when performing new exercises.    Person(s) Educated Patient    Methods Explanation;Demonstration    Comprehension Returned demonstration;Verbalized understanding                 PT Long Term Goals - 05/31/20 1131  PT LONG TERM GOAL #1   Title Independent with HEP    Period Weeks    Status On-going    Target Date 07/12/20      PT LONG TERM GOAL #2   Title Improve FOTO outcome measure score to 78% or greater functional status    Period Weeks    Status Partially Met    Target Date 07/12/20      PT LONG TERM GOAL #3   Title Increase hip flexion AROM 5-10 deg bilat. to improve ability to donn shoes    Period Weeks    Status Achieved    Target Date 07/12/20      PT LONG TERM GOAL #4   Title Tolerate seated positions and sit<>stand with LBP symptoms/pain decreased 60% or greater from baseline status    Period Weeks    Status Achieved      PT LONG TERM GOAL #5   Title Increase bilat. hip extension and right hip abduction strength at least 1/2 MMT grade from baseline status to improve ability for transfers and lifting activities for work    Period Weeks    Target Date 07/12/20                   Plan - 07/15/20 1901     Clinical Impression Statement Pt responded well to all treatment today, demonstrating ability to perform all exercises with proper form and without increase in  pain. He has 0/10 pain throughout the entire session and reports a good muscular challenge with the exercises performed. He will continue to benefit from skilled PT to address his primary impairments and help him return to his prior level of function without limitation.    Personal Factors and Comorbidities Comorbidity 3+    Comorbidities see PMH    Examination-Activity Limitations Dressing;Sit    Stability/Clinical Decision Making Evolving/Moderate complexity    Clinical Decision Making Moderate    Rehab Potential Good    PT Frequency 1x / week    PT Duration 8 weeks    PT Treatment/Interventions ADLs/Self Care Home Management;Cryotherapy;Traction;Electrical Stimulation;Moist Heat;Therapeutic exercise;Neuromuscular re-education;Functional mobility training;Therapeutic activities;Taping;Dry needling;Spinal Manipulations;Manual techniques;Patient/family education    PT Next Visit Plan continue extension bias ROM and lumbar/core strengthening, core strengthening. mechanical traction/ manual therapy PRN. Consider d/c if pt has met his PT goals.    PT Home Exercise Plan Access code: [email or web address]    Consulted and Agree with Plan of Care Patient             Patient will benefit from skilled therapeutic intervention in order to improve the following deficits and impairments:  Pain, Impaired flexibility, Decreased strength, Increased muscle spasms, Decreased range of motion, Hypomobility  Visit Diagnosis: Acute right-sided low back pain without sciatica     Problem List Patient Active Problem List   Diagnosis Date Noted   Vitamin D deficiency 09/25/2019   Class 3 severe obesity with serious comorbidity and body mass index (BMI) of 40.0 to 44.9 in adult ([place]) 09/25/2019   Papilledema 07/11/2018   Obstructive sleep apnea on CPAP 10/12/2016   Pseudotumor cerebri 07/09/2016   History of obstructive sleep apnea 07/09/2016   Syncope and collapse 02/06/2016    Vanessa [place], PT,  DPT 07/15/20 7:10 PM   [place] [facility] [address] [place], [place], [address] Phone: [phone]   Fax:  [phone]  Name: Carl Knox MRN: [id number] Date of Birth: 29-Jan-1965

## 2020-07-15 NOTE — Patient Instructions (Signed)
  [email or web address]

## 2020-07-16 ENCOUNTER — Encounter (INDEPENDENT_AMBULATORY_CARE_PROVIDER_SITE_OTHER): Payer: Self-pay | Admitting: Family Medicine

## 2020-07-16 ENCOUNTER — Ambulatory Visit (INDEPENDENT_AMBULATORY_CARE_PROVIDER_SITE_OTHER): Payer: 59 | Admitting: Family Medicine

## 2020-07-16 VITALS — BP 111/71 | HR 58 | Temp 98.1°F | Ht 71.0 in | Wt 289.0 lb

## 2020-07-16 DIAGNOSIS — Z6841 Body Mass Index (BMI) 40.0 and over, adult: Secondary | ICD-10-CM

## 2020-07-16 DIAGNOSIS — E559 Vitamin D deficiency, unspecified: Secondary | ICD-10-CM

## 2020-07-22 ENCOUNTER — Ambulatory Visit: Payer: 59 | Attending: Neurology

## 2020-07-22 ENCOUNTER — Other Ambulatory Visit (HOSPITAL_COMMUNITY): Payer: Self-pay

## 2020-07-22 ENCOUNTER — Other Ambulatory Visit: Payer: Self-pay

## 2020-07-22 DIAGNOSIS — M545 Low back pain, unspecified: Secondary | ICD-10-CM | POA: Diagnosis not present

## 2020-07-22 MED FILL — Folic Acid Tab 1 MG: ORAL | 90 days supply | Qty: 90 | Fill #0 | Status: AC

## 2020-07-22 NOTE — Therapy (Signed)
[place], [place], [address] Phone: [phone]   Fax:  [phone]  Physical Therapy Treatment  Patient Details  Name: Court Gracia MRN: [id number] Date of Birth: [birth year] Referring Provider (PT): Margette Fast, MD   Encounter Date: 07/22/2020   PT End of Session - 07/22/20 1854     Visit Number 9    Number of Visits 10    Date for PT Re-Evaluation 07/26/20    Authorization Type recheck FOTO by visit 6 and 10th visit    PT Start Time 1830    PT Stop Time 1915    PT Time Calculation (min) 45 min    Activity Tolerance Patient tolerated treatment well    Behavior During Therapy [facility] for tasks assessed/performed             Past Medical History:  Diagnosis Date   Hypothyroidism    Morbid obesity ([place])    OSA on CPAP    Other specified disorders of thyroid    Papilledema    Pseudotumor cerebri    Sickle cell trait ([place])    Syncope and collapse 02/06/2016    Past Surgical History:  Procedure Laterality Date   CYST EXCISION  2001   Thyglossal duct   TONSILLECTOMY  1980    There were no vitals filed for this visit.   Subjective Assessment - 07/22/20 1832     Subjective Pt reports that he feels like his cortisone shot is "wearing off," adding that he has 1/10 achy soreness in his R lumbar region. He reports regular adherence to his HEP.    Currently in Pain? Yes    Pain Score 1     Pain Location Back    Pain Orientation Right;Lower    Pain Descriptors / Indicators Aching;Dull;Sore    Pain Type Chronic pain                               [facility] Adult PT Treatment/Exercise - 07/22/20 0001       Lumbar Exercises: Standing   Other Standing Lumbar Exercises overhead QL stretch 2x30sec BIL      Lumbar Exercises: Supine   Other Supine Lumbar Exercises Lumbar rotation (windshield wipers) 2x10 BIL      Lumbar Exercises: Prone   Other Prone Lumbar Exercises prone press  up 1 x 15, prone press up with belly sag 1 x 15      Traction   Type of Traction Lumbar    Min (lbs) 120    Max (lbs) 140    Hold Time 60    Rest Time 20    Time 14                    PT Education - 07/22/20 [place]     Education Details Pt educated on pain science as it relates to diminished effects of cortisone shots in relation to pain threshold.    Person(s) Educated Patient    Methods Explanation    Comprehension Verbalized understanding                 PT Long Term Goals - 05/31/20 1131       PT LONG TERM GOAL #1   Title Independent with HEP    Period Weeks    Status On-going    Target Date 07/12/20      PT LONG TERM GOAL #2  Title Improve FOTO outcome measure score to 78% or greater functional status    Period Weeks    Status Partially Met    Target Date 07/12/20      PT LONG TERM GOAL #3   Title Increase hip flexion AROM 5-10 deg bilat. to improve ability to donn shoes    Period Weeks    Status Achieved    Target Date 07/12/20      PT LONG TERM GOAL #4   Title Tolerate seated positions and sit<>stand with LBP symptoms/pain decreased 60% or greater from baseline status    Period Weeks    Status Achieved      PT LONG TERM GOAL #5   Title Increase bilat. hip extension and right hip abduction strength at least 1/2 MMT grade from baseline status to improve ability for transfers and lifting activities for work    Period Weeks    Target Date 07/12/20                   Plan - 07/22/20 1903     Clinical Impression Statement Pt responded well to all treatment today, reporting improved symptoms following lumbar traction. He has 0/10 pain at the end of the session. He will continue to benefit from skilled PT to address his primary impairments and help him return to his prior level of function without limitation.    Personal Factors and Comorbidities Comorbidity 3+    Comorbidities see PMH    Examination-Activity Limitations Dressing;Sit     Stability/Clinical Decision Making Evolving/Moderate complexity    Clinical Decision Making Moderate    Rehab Potential Good    PT Frequency 1x / week    PT Duration 8 weeks    PT Treatment/Interventions ADLs/Self Care Home Management;Cryotherapy;Traction;Electrical Stimulation;Moist Heat;Therapeutic exercise;Neuromuscular re-education;Functional mobility training;Therapeutic activities;Taping;Dry needling;Spinal Manipulations;Manual techniques;Patient/family education    PT Next Visit Plan continue extension bias ROM and lumbar/core strengthening, core strengthening. mechanical traction/ manual therapy PRN. Consider d/c if pt has met his PT goals.    PT Home Exercise Plan Access code: [email or web address]    Consulted and Agree with Plan of Care Patient             Patient will benefit from skilled therapeutic intervention in order to improve the following deficits and impairments:  Pain, Impaired flexibility, Decreased strength, Increased muscle spasms, Decreased range of motion, Hypomobility  Visit Diagnosis: Acute right-sided low back pain without sciatica     Problem List Patient Active Problem List   Diagnosis Date Noted   Vitamin D deficiency 09/25/2019   Class 3 severe obesity with serious comorbidity and body mass index (BMI) of 40.0 to 44.9 in adult ([place]) 09/25/2019   Papilledema 07/11/2018   Obstructive sleep apnea on CPAP 10/12/2016   Pseudotumor cerebri 07/09/2016   History of obstructive sleep apnea 07/09/2016   Syncope and collapse 02/06/2016    Vanessa [place], PT, DPT 07/22/20 7:08 PM   [place] [facility] [address] [place], [place], [address] Phone: [phone]   Fax:  [phone]  Name: Leno Mathes MRN: [id number] Date of Birth: 1965-11-06

## 2020-07-23 NOTE — Progress Notes (Signed)
Chief Complaint:   OBESITY Carl Knox is here to discuss his progress with his obesity treatment plan along with follow-up of his obesity related diagnoses. Carl Knox is on the Category 3 Plan and states he is following his eating plan approximately 50% of the time. Carl Knox states he is doing 0 minutes 0 times per week.  Today's visit was #: 9 Starting weight: 290 lbs Starting date: 04/26/2019 Today's weight: 289 lbs Today's date: 07/16/2020 Total lbs lost to date: 1 Total lbs lost since last in-office visit: 1  Interim History: [name] continues to work on weight loss. He is struggling with chronic back pain and this affects his eating and activity. He is doing physical therapy 1 time per week.  Subjective:   1. Vitamin D deficiency Carl Knox is not on his Vit D prescription any longer. He is due for labs soon but he still notes some fatigue although this may be related to his chronic back pain.  Assessment/Plan:   1. Vitamin D deficiency Low Vitamin D level contributes to fatigue and are associated with obesity, breast, and colon cancer. Carl Knox agreed to discontinue prescription Vitamin D and we will recheck labs in 3-4 weeks. He will follow-up for routine testing of Vitamin D, at least 2-3 times per year to avoid over-replacement.  2. Obesity with current BMI 40.4 Carl Knox is currently in the action stage of change. As such, his goal is to continue with weight loss efforts. He has agreed to change to following a lower carbohydrate, vegetable and lean protein rich diet plan.   Behavioral modification strategies: increasing lean protein intake, decreasing simple carbohydrates, and increasing water intake.  Carl Knox has agreed to follow-up with our clinic in 3 to 4 weeks. He was informed of the importance of frequent follow-up visits to maximize his success with intensive lifestyle modifications for his multiple health conditions.   Objective:   Blood pressure 111/71, pulse (!) 58, temperature 98.1 F  (36.7 C), height 5\' 11"  (1.803 m), weight 289 lb (131.1 kg), SpO2 97 %. Body mass index is 40.31 kg/m.  General: Cooperative, alert, well developed, in no acute distress. HEENT: Conjunctivae and lids unremarkable. Cardiovascular: Regular rhythm.  Lungs: Normal work of breathing. Neurologic: No focal deficits.   Lab Results  Component Value Date   CREATININE 1.28 (H) 12/24/2019   BUN 19 12/24/2019   NA 141 12/24/2019   K 4.6 12/24/2019   CL 106 12/24/2019   CO2 23 12/24/2019   Lab Results  Component Value Date   ALT 22 12/24/2019   AST 27 12/24/2019   ALKPHOS 112 12/24/2019   BILITOT 0.4 12/24/2019   Lab Results  Component Value Date   HGBA1C 5.4 12/24/2019   HGBA1C 5.4 04/26/2019   Lab Results  Component Value Date   INSULIN 11.4 12/24/2019   INSULIN 6.1 04/26/2019   Lab Results  Component Value Date   TSH 4.400 12/24/2019   Lab Results  Component Value Date   CHOL 117 04/26/2019   HDL 43 04/26/2019   LDLCALC 57 04/26/2019   TRIG 87 04/26/2019   Lab Results  Component Value Date   VD25OH 34.8 12/24/2019   VD25OH 42.6 08/08/2019   VD25OH 32.2 04/26/2019   Lab Results  Component Value Date   WBC 4.5 04/15/2020   HGB 14.2 04/15/2020   HCT 41.8 04/15/2020   MCV 77.6 (L) 04/15/2020   PLT 222 04/15/2020   Lab Results  Component Value Date   IRON 74 04/15/2020  TIBC 306 04/15/2020   FERRITIN 300 04/15/2020   Attestation Statements:   Reviewed by clinician on day of visit: allergies, medications, problem list, medical history, surgical history, family history, social history, and previous encounter notes.  Time spent on visit including pre-visit chart review and post-visit care and charting was 30 minutes.    I, Trixie Dredge, am acting as transcriptionist for Dennard Nip, MD.  I have reviewed the above documentation for accuracy and completeness, and I agree with the above. -  Dennard Nip, MD

## 2020-07-30 ENCOUNTER — Other Ambulatory Visit: Payer: Self-pay

## 2020-07-30 ENCOUNTER — Ambulatory Visit: Payer: 59

## 2020-07-30 DIAGNOSIS — M545 Low back pain, unspecified: Secondary | ICD-10-CM | POA: Diagnosis not present

## 2020-07-30 NOTE — Patient Instructions (Signed)
  [email or web address]

## 2020-07-30 NOTE — Therapy (Signed)
[place], [place], [address] Phone: [phone]   Fax:  [phone]  Physical Therapy Treatment/ Re-certification/ Discharge Summary  Progress Note Reporting Period 07/26/2020 to 07/30/2020  See note below for Objective Data and Assessment of Progress/Goals.      Patient Details  Name: Lavonne Cass MRN: [id number] Date of Birth: 06/08/1965 Referring Provider (PT): Margette Fast, MD   Encounter Date: 07/30/2020   PT End of Session - 07/30/20 1852     Visit Number 10    Number of Visits 10    Date for PT Re-Evaluation 07/26/20    Authorization Type recheck FOTO by visit 6 and 10th visit    PT Start Time 1830    PT Stop Time 1900    PT Time Calculation (min) 30 min    Activity Tolerance Patient tolerated treatment well    Behavior During Therapy [facility] for tasks assessed/performed             Past Medical History:  Diagnosis Date   Hypothyroidism    Morbid obesity ([place])    OSA on CPAP    Other specified disorders of thyroid    Papilledema    Pseudotumor cerebri    Sickle cell trait ([place])    Syncope and collapse 02/06/2016    Past Surgical History:  Procedure Laterality Date   CYST EXCISION  2001   Thyglossal duct   TONSILLECTOMY  1980    There were no vitals filed for this visit.   Subjective Assessment - 07/30/20 1830     Subjective Pt reports no pain today, adding that he feels he is getting better since starting PT. He adds that he has been adherent to his HEP, performing his exercises daily.    Currently in Pain? No/denies    Pain Score 0-No pain                [facility] PT Assessment - 07/30/20 0001       Observation/Other Assessments   Focus on Therapeutic Outcomes (FOTO)  83%      AROM   Right Hip Flexion 123    Left Hip Flexion 125      Strength   Right Hip Extension 5/5    Right Hip ABduction 5/5    Left Hip Extension 5/5    Left Hip ABduction 5/5                                    PT Education - 07/30/20 1852     Education Details Pt instructed to continue adherence and self-progression of HEP. Reviewed HEP with pt, discontinuing some older exercises such as posterior pelvic tilts.    Person(s) Educated Patient    Methods Explanation    Comprehension Verbalized understanding                 PT Long Term Goals - 07/30/20 1854       PT LONG TERM GOAL #1   Title Independent with HEP    Period Weeks    Status Achieved      PT LONG TERM GOAL #2   Title Improve FOTO outcome measure score to 78% or greater functional status    Baseline 83% on 07/30/2020    Period Weeks    Status Achieved      PT LONG TERM GOAL #3   Title Increase hip flexion AROM 5-10  deg bilat. to improve ability to donn shoes    Baseline 122 on R, 125 on L (07/30/2020)    Period Weeks    Status Achieved      PT LONG TERM GOAL #4   Title Tolerate seated positions and sit<>stand with LBP symptoms/pain decreased 60% or greater from baseline status    Period Weeks    Status Achieved      PT LONG TERM GOAL #5   Title Increase bilat. hip extension and right hip abduction strength at least 1/2 MMT grade from baseline status to improve ability for transfers and lifting activities for work    Baseline 5/5 BIL extension and abduction    Period Weeks    Status Achieved                   Plan - 07/30/20 1853     Clinical Impression Statement Upon re-assessment, pt has gone above and beyond in achieving his goals of PT. He has met each of his functional goals and reports feeling ready to be discharged from PT. As a result of meeting his goals and being functionally sound, he is discharged from PT at this time.    Personal Factors and Comorbidities Comorbidity 3+    Comorbidities see PMH    Examination-Activity Limitations Dressing;Sit    Stability/Clinical Decision Making Evolving/Moderate complexity    Clinical Decision Making  Moderate    Rehab Potential Good    PT Frequency 1x / week    PT Duration 8 weeks    PT Treatment/Interventions ADLs/Self Care Home Management;Cryotherapy;Traction;Electrical Stimulation;Moist Heat;Therapeutic exercise;Neuromuscular re-education;Functional mobility training;Therapeutic activities;Taping;Dry needling;Spinal Manipulations;Manual techniques;Patient/family education    PT Next Visit Plan Pt is discharged from PT due to meeting his PT goals.    PT Home Exercise Plan Access code: [email or web address]    Consulted and Agree with Plan of Care Patient             Patient will benefit from skilled therapeutic intervention in order to improve the following deficits and impairments:  Pain, Impaired flexibility, Decreased strength, Increased muscle spasms, Decreased range of motion, Hypomobility  Visit Diagnosis: Acute right-sided low back pain without sciatica     Problem List Patient Active Problem List   Diagnosis Date Noted   Vitamin D deficiency 09/25/2019   Class 3 severe obesity with serious comorbidity and body mass index (BMI) of 40.0 to 44.9 in adult [facility]) 09/25/2019   Papilledema 07/11/2018   Obstructive sleep apnea on CPAP 10/12/2016   Pseudotumor cerebri 07/09/2016   History of obstructive sleep apnea 07/09/2016   Syncope and collapse 02/06/2016     [place], [place], [address] Phone: [phone]   Fax:  [phone]  Name: Jamair Cato MRN: [id number] Date of Birth: [birth year]  PHYSICAL THERAPY DISCHARGE SUMMARY  Visits from Start of Care: 10  Current functional level related to goals / functional outcomes: Pt has met all of his functional goals of PT.   Remaining deficits: None identified.   Education / Equipment: Robust HEP   Patient agrees to discharge. Patient goals were met. Patient is being discharged due to meeting the stated rehab goals.  Vanessa Alvo, PT,  DPT 07/30/20 6:58 PM

## 2020-08-04 ENCOUNTER — Other Ambulatory Visit (HOSPITAL_COMMUNITY): Payer: Self-pay

## 2020-08-19 ENCOUNTER — Encounter (INDEPENDENT_AMBULATORY_CARE_PROVIDER_SITE_OTHER): Payer: Self-pay | Admitting: Family Medicine

## 2020-08-19 ENCOUNTER — Ambulatory Visit (INDEPENDENT_AMBULATORY_CARE_PROVIDER_SITE_OTHER): Payer: 59 | Admitting: Family Medicine

## 2020-08-19 ENCOUNTER — Other Ambulatory Visit: Payer: Self-pay

## 2020-08-19 VITALS — BP 116/71 | HR 51 | Temp 97.8°F | Ht 71.0 in | Wt 276.0 lb

## 2020-08-19 DIAGNOSIS — Z9189 Other specified personal risk factors, not elsewhere classified: Secondary | ICD-10-CM | POA: Diagnosis not present

## 2020-08-19 DIAGNOSIS — E559 Vitamin D deficiency, unspecified: Secondary | ICD-10-CM

## 2020-08-19 DIAGNOSIS — R5383 Other fatigue: Secondary | ICD-10-CM | POA: Diagnosis not present

## 2020-08-19 DIAGNOSIS — Z6841 Body Mass Index (BMI) 40.0 and over, adult: Secondary | ICD-10-CM | POA: Diagnosis not present

## 2020-08-20 LAB — CMP14+EGFR
ALT: 19 IU/L (ref 0–44)
AST: 26 IU/L (ref 0–40)
Albumin/Globulin Ratio: 1.6 (ref 1.2–2.2)
Albumin: 4.3 g/dL (ref 3.8–4.9)
Alkaline Phosphatase: 97 IU/L (ref 44–121)
BUN/Creatinine Ratio: 19 (ref 9–20)
BUN: 26 mg/dL — ABNORMAL HIGH (ref 6–24)
Bilirubin Total: 0.4 mg/dL (ref 0.0–1.2)
CO2: 21 mmol/L (ref 20–29)
Calcium: 9.4 mg/dL (ref 8.7–10.2)
Chloride: 109 mmol/L — ABNORMAL HIGH (ref 96–106)
Creatinine, Ser: 1.34 mg/dL — ABNORMAL HIGH (ref 0.76–1.27)
Globulin, Total: 2.7 g/dL (ref 1.5–4.5)
Glucose: 85 mg/dL (ref 65–99)
Potassium: 5 mmol/L (ref 3.5–5.2)
Sodium: 141 mmol/L (ref 134–144)
Total Protein: 7 g/dL (ref 6.0–8.5)
eGFR: 63 mL/min/{1.73_m2} (ref >59)

## 2020-08-20 LAB — HEMOGLOBIN A1C
Est. average glucose Bld gHb Est-mCnc: 100 mg/dL
Hgb A1c MFr Bld: 5.1 % (ref 4.8–5.6)

## 2020-08-20 LAB — LIPID PANEL WITH LDL/HDL RATIO
Cholesterol, Total: 110 mg/dL (ref 100–199)
HDL: 39 mg/dL — ABNORMAL LOW (ref >39)
LDL Chol Calc (NIH): 54 mg/dL (ref 0–99)
LDL/HDL Ratio: 1.4 ratio (ref 0.0–3.6)
Triglycerides: 85 mg/dL (ref 0–149)
VLDL Cholesterol Cal: 17 mg/dL (ref 5–40)

## 2020-08-20 LAB — TSH: TSH: 2.25 u[IU]/mL (ref 0.450–4.500)

## 2020-08-20 LAB — INSULIN, RANDOM: INSULIN: 4.5 u[IU]/mL (ref 2.6–24.9)

## 2020-08-20 LAB — VITAMIN D 25 HYDROXY (VIT D DEFICIENCY, FRACTURES): Vit D, 25-Hydroxy: 35.3 ng/mL (ref 30.0–100.0)

## 2020-08-21 NOTE — Progress Notes (Signed)
Chief Complaint:   OBESITY Carl Knox is here to discuss his progress with his obesity treatment plan along with follow-up of his obesity related diagnoses. Carl Knox is on following a lower carbohydrate, vegetable and lean protein rich diet plan and states he is following his eating plan approximately 100% of the time. Carl Knox states he is stretching for 10 minutes 7 times per week.  Today's visit was #: 10 Starting weight: 290 lbs Starting date: 04/26/2019 Today's weight: 276 lbs Today's date: 08/19/2020 Total lbs lost to date: 14 Total lbs lost since last in-office visit: 13  Interim History: Carl Knox has been doing well with his Lower carbohydrate plan. His hunger is controlled and he does not feel deprived. He would like to continue on his plan.  Subjective:   1. Vitamin D deficiency Carl Knox is on multivitamins, and he id due to have his levels checked.  2. Other fatigue Carl Knox feels his fatigue is improving with the Lower carbohydrate plan and with weight loss. He is due to have labs checked.  3. At risk for dehydration Carl Knox is at risk for dehydration due to inadequate water intake.  Assessment/Plan:   1. Vitamin D deficiency Low Vitamin D level contributes to fatigue and are associated with obesity, breast, and colon cancer. We will check labs today. Carl Knox will follow-up for routine testing of Vitamin D, at least 2-3 times per year to avoid over-replacement.  - VITAMIN D 25 Hydroxy (Vit-D Deficiency, Fractures)  2. Other fatigue We will check labs today. Carl Knox will focus on self care including making healthy food choices, increasing physical activity and focusing on stress reduction.  - CMP14+EGFR - Lipid Panel With LDL/HDL Ratio - Insulin, random - Hemoglobin A1c - TSH  3. At risk for dehydration Carl Knox was given approximately 15 minutes dehydration prevention counseling today. Carl Knox is at risk for dehydration due to weight loss and current medication(s). He was encouraged to  hydrate and monitor fluid status to avoid dehydration as well as weight loss plateaus.   4. Obesity with current BMI 38.5 Carl Knox is currently in the action stage of change. As such, his goal is to continue with weight loss efforts. He has agreed to following a lower carbohydrate, vegetable and lean protein rich diet plan.   Exercise goals: As is.  Behavioral modification strategies: increasing lean protein intake and decreasing simple carbohydrates.  Carl Knox has agreed to follow-up with our clinic in 8 weeks. He was informed of the importance of frequent follow-up visits to maximize his success with intensive lifestyle modifications for his multiple health conditions.   Carl Knox was informed we would discuss his lab results at his next visit unless there is a critical issue that needs to be addressed sooner. Carl Knox agreed to keep his next visit at the agreed upon time to discuss these results.  Objective:   Blood pressure 116/71, pulse (!) 51, temperature 97.8 F (36.6 C), height 5' 11"  (1.803 m), weight 276 lb (125.2 kg), SpO2 97 %. Body mass index is 38.49 kg/m.  General: Cooperative, alert, well developed, in no acute distress. HEENT: Conjunctivae and lids unremarkable. Cardiovascular: Regular rhythm.  Lungs: Normal work of breathing. Neurologic: No focal deficits.   Lab Results  Component Value Date   CREATININE 1.34 (H) 08/19/2020   BUN 26 (H) 08/19/2020   NA 141 08/19/2020   K 5.0 08/19/2020   CL 109 (H) 08/19/2020   CO2 21 08/19/2020   Lab Results  Component Value Date   ALT 19 08/19/2020  AST 26 08/19/2020   ALKPHOS 97 08/19/2020   BILITOT 0.4 08/19/2020   Lab Results  Component Value Date   HGBA1C 5.1 08/19/2020   HGBA1C 5.4 12/24/2019   HGBA1C 5.4 04/26/2019   Lab Results  Component Value Date   INSULIN 4.5 08/19/2020   INSULIN 11.4 12/24/2019   INSULIN 6.1 04/26/2019   Lab Results  Component Value Date   TSH 2.250 08/19/2020   Lab Results  Component  Value Date   CHOL 110 08/19/2020   HDL 39 (L) 08/19/2020   LDLCALC 54 08/19/2020   TRIG 85 08/19/2020   Lab Results  Component Value Date   VD25OH 35.3 08/19/2020   VD25OH 34.8 12/24/2019   VD25OH 42.6 08/08/2019   Lab Results  Component Value Date   WBC 4.5 04/15/2020   HGB 14.2 04/15/2020   HCT 41.8 04/15/2020   MCV 77.6 (L) 04/15/2020   PLT 222 04/15/2020   Lab Results  Component Value Date   IRON 74 04/15/2020   TIBC 306 04/15/2020   FERRITIN 300 04/15/2020    Attestation Statements:   Reviewed by clinician on day of visit: allergies, medications, problem list, medical history, surgical history, family history, social history, and previous encounter notes.   I, Trixie Dredge, am acting as transcriptionist for Dennard Nip, MD.  I have reviewed the above documentation for accuracy and completeness, and I agree with the above. -  Dennard Nip, MD

## 2020-08-28 ENCOUNTER — Other Ambulatory Visit (HOSPITAL_COMMUNITY): Payer: Self-pay

## 2020-08-28 MED FILL — Levothyroxine Sodium Tab 75 MCG: ORAL | 90 days supply | Qty: 90 | Fill #1 | Status: AC

## 2020-08-29 DIAGNOSIS — M47816 Spondylosis without myelopathy or radiculopathy, lumbar region: Secondary | ICD-10-CM | POA: Diagnosis not present

## 2020-08-29 DIAGNOSIS — G932 Benign intracranial hypertension: Secondary | ICD-10-CM | POA: Diagnosis not present

## 2020-08-29 DIAGNOSIS — M5431 Sciatica, right side: Secondary | ICD-10-CM | POA: Diagnosis not present

## 2020-08-29 DIAGNOSIS — G4733 Obstructive sleep apnea (adult) (pediatric): Secondary | ICD-10-CM | POA: Diagnosis not present

## 2020-09-01 ENCOUNTER — Telehealth: Payer: Self-pay | Admitting: *Deleted

## 2020-09-01 NOTE — Telephone Encounter (Signed)
Received fax from [facility] re: patient's eye exam.. Placed on MD desk for review, no papilledema.

## 2020-09-03 ENCOUNTER — Telehealth: Payer: Self-pay | Admitting: Neurology

## 2020-09-03 NOTE — Telephone Encounter (Signed)
The patient's undergone an ophthalmology evaluation done on 29 August 2020.  No evidence of papilledema was noted.  He will continue to be followed through ophthalmology.

## 2020-09-04 ENCOUNTER — Encounter: Payer: Self-pay | Admitting: Neurology

## 2020-09-04 ENCOUNTER — Other Ambulatory Visit: Payer: Self-pay

## 2020-09-04 ENCOUNTER — Other Ambulatory Visit (HOSPITAL_COMMUNITY): Payer: Self-pay

## 2020-09-04 ENCOUNTER — Ambulatory Visit: Payer: 59 | Admitting: Neurology

## 2020-09-04 VITALS — BP 112/66 | HR 65 | Ht 71.0 in | Wt 270.0 lb

## 2020-09-04 DIAGNOSIS — G932 Benign intracranial hypertension: Secondary | ICD-10-CM

## 2020-09-04 DIAGNOSIS — R55 Syncope and collapse: Secondary | ICD-10-CM

## 2020-09-04 MED ORDER — TROKENDI XR 100 MG PO CP24
200.0000 mg | ORAL_CAPSULE | Freq: Every day | ORAL | 2 refills | Status: DC
  Filled 2020-09-04: qty 180, 90d supply, fill #0
  Filled 2020-11-11: qty 180, 90d supply, fill #1

## 2020-09-04 NOTE — Progress Notes (Signed)
Reason for visit: Pseudotumor cerebri, back pain, right leg pain  Carl Knox is an 55 y.o. male  History of present illness:  Mr. Shrieves is a 55 year old left-handed black male with a history of pseudotumor with papilledema.  He has been followed through his ophthalmologist, his most recent eye examination revealed resolution of the papilledema.  He has been on Trokendi taking 200 mg in the evening.  He had a lumbar puncture done in February 2022, opening pressure was 28 cm of water.  The patient unfortunately began having some right leg pain and back pain following the lumbar puncture.  MRI of the lumbar spine showed evidence of a disc bulge with a potential for right L5 nerve root impingement.  He has been followed through Dr. Tonita Cong, and he has had injections through Dr. Nelva Bush.  Injections do help some, but the benefit lasts only about a month or so.  He notes that it is painful for him to sit, but he feels better when he is up and walking and feels better when he is lying down.  He is sleeping fairly well at night.  He is on gabapentin 300 mg twice daily.  He returns for an evaluation.  Past Medical History:  Diagnosis Date   Hypothyroidism    Morbid obesity ([place])    OSA on CPAP    Other specified disorders of thyroid    Papilledema    Pseudotumor cerebri    Sickle cell trait ([facility])    Syncope and collapse 02/06/2016    Past Surgical History:  Procedure Laterality Date   CYST EXCISION  2001   Thyglossal duct   TONSILLECTOMY  1980    Family History  Problem Relation Age of Onset   Hypertension Mother    Heart disease Mother    Kidney disease Mother    Hyperlipidemia Mother    Sleep apnea Mother    Obesity Mother    Diabetes Father    Hypertension Father    Hyperlipidemia Father    Heart disease Father    Kidney disease Father    Obesity Father     Social history:  reports that he has never smoked. He has never used smokeless tobacco. He reports current alcohol use.  He reports that he does not use drugs.    Allergies  Allergen Reactions   Valtrex [Valacyclovir Hcl]     Medications:  Prior to Admission medications   Medication Sig Start Date End Date Taking? Authorizing Provider  folic acid (FOLVITE) 1 MG tablet TAKE 1 TABLET (1 MG TOTAL) BY MOUTH DAILY. 04/15/20 04/15/21 Yes Ennever, Rudell Cobb, MD  levothyroxine (SYNTHROID) 75 MCG tablet TAKE 1 TABLET BY MOUTH EVERY MORNING ON AN EMPTY STOMACH 02/29/20 02/28/21 Yes Leeroy Cha, MD  levothyroxine (SYNTHROID, LEVOTHROID) 75 MCG tablet Take 75 mcg by mouth daily. 10/12/16  Yes [provider]  Multiple Vitamin (MULTIVITAMIN) tablet Take 1 tablet by mouth daily.   Yes [provider]  Topiramate ER (TROKENDI XR) 100 MG CP24 Take 2 capsules by mouth at bedtime 05/09/20  Yes Kathrynn Ducking, MD    ROS:  Out of a complete 14 system review of symptoms, the patient complains only of the following symptoms, and all other reviewed systems are negative.  Low back pain, right leg pain  Blood pressure 112/66, pulse 65, height '5\' 11"'$  (1.803 m), weight 270 lb (122.5 kg), SpO2 94 %.  Physical Exam  General: The patient is alert and cooperative at the  time of the examination.  The patient is markedly obese.  Skin: No significant peripheral edema is noted.   Neurologic Exam  Mental status: The patient is alert and oriented x 3 at the time of the examination. The patient has apparent normal recent and remote memory, with an apparently normal attention span and concentration ability.   Cranial nerves: Facial symmetry is present. Speech is normal, no aphasia or dysarthria is noted. Extraocular movements are full. Visual fields are full.  Pupils are equal, round, and reactive to light.  Discs are flat bilaterally.  Motor: The patient has good strength in all 4 extremities.  Sensory examination: Soft touch sensation is symmetric on the face, arms, and legs.  Coordination: The patient  has good finger-nose-finger and heel-to-shin bilaterally.  Gait and station: The patient has a normal gait. Tandem gait is normal. Romberg is negative. No drift is seen.  The patient is able to walk on heels and the toes bilaterally.  Reflexes: Deep tendon reflexes are symmetric.   Assessment/Plan:  1.  Pseudotumor cerebri, good resolution on Trokendi  2.  Low back pain, right leg pain  The patient has had good resolution of his high intracranial pressures with Trokendi.  The papilledema is no longer present through ophthalmologic evaluation.  He is now having some problems with back pain and right leg pain, he has evidence by MRI of impingement of the right L5 nerve root.  He has no weakness on clinical examination.  He is no longer having syncopal episodes associated with laughing.  He will continue to be followed through orthopedic surgery for his back, he was given a prescription for Trokendi, he will follow-up here in 6 months, in the future he can be seen through Dr. Jaynee Eagles.  Jill Alexanders MD 09/04/2020 3:54 PM  [facility] [address] [place], [place] [address]  Phone [phone] Fax [phone]

## 2020-09-05 ENCOUNTER — Other Ambulatory Visit (HOSPITAL_COMMUNITY): Payer: Self-pay

## 2020-09-05 DIAGNOSIS — M545 Low back pain, unspecified: Secondary | ICD-10-CM | POA: Diagnosis not present

## 2020-09-05 DIAGNOSIS — D573 Sickle-cell trait: Secondary | ICD-10-CM | POA: Diagnosis not present

## 2020-09-05 DIAGNOSIS — M5136 Other intervertebral disc degeneration, lumbar region: Secondary | ICD-10-CM | POA: Diagnosis not present

## 2020-09-05 DIAGNOSIS — H0510 Unspecified chronic inflammatory disorders of orbit: Secondary | ICD-10-CM | POA: Diagnosis not present

## 2020-09-05 MED ORDER — GABAPENTIN 300 MG PO CAPS
300.0000 mg | ORAL_CAPSULE | Freq: Two times a day (BID) | ORAL | 2 refills | Status: DC | PRN
  Filled 2020-09-05: qty 60, 30d supply, fill #0

## 2020-09-16 ENCOUNTER — Telehealth: Payer: Self-pay | Admitting: *Deleted

## 2020-09-16 ENCOUNTER — Telehealth: Payer: Self-pay

## 2020-09-16 NOTE — Telephone Encounter (Signed)
FMLA paper work completed for pt's wife Nole Monahan).  Completed forms placed on Dr. Jannifer Franklin desk for review and signature.

## 2020-09-16 NOTE — Telephone Encounter (Signed)
I faxed pt FMLA form on 09/16/20.

## 2020-09-22 DIAGNOSIS — K121 Other forms of stomatitis: Secondary | ICD-10-CM | POA: Diagnosis not present

## 2020-09-22 DIAGNOSIS — R509 Fever, unspecified: Secondary | ICD-10-CM | POA: Diagnosis not present

## 2020-09-22 DIAGNOSIS — R6889 Other general symptoms and signs: Secondary | ICD-10-CM | POA: Diagnosis not present

## 2020-09-22 DIAGNOSIS — U071 COVID-19: Secondary | ICD-10-CM | POA: Diagnosis not present

## 2020-09-22 DIAGNOSIS — R6883 Chills (without fever): Secondary | ICD-10-CM | POA: Diagnosis not present

## 2020-09-23 ENCOUNTER — Other Ambulatory Visit (HOSPITAL_COMMUNITY): Payer: Self-pay

## 2020-09-23 MED ORDER — TRIAMCINOLONE ACETONIDE 0.1 % MT PSTE
PASTE | OROMUCOSAL | 0 refills | Status: DC
  Filled 2020-09-23: qty 5, 10d supply, fill #0

## 2020-09-23 MED ORDER — NIRMATRELVIR/RITONAVIR (PAXLOVID) TABLET (RENAL DOSING)
ORAL_TABLET | ORAL | 0 refills | Status: DC
  Filled 2020-09-23: qty 20, 5d supply, fill #0

## 2020-09-24 ENCOUNTER — Other Ambulatory Visit (HOSPITAL_COMMUNITY): Payer: Self-pay

## 2020-10-14 ENCOUNTER — Ambulatory Visit (INDEPENDENT_AMBULATORY_CARE_PROVIDER_SITE_OTHER): Payer: 59 | Admitting: Family Medicine

## 2020-10-14 ENCOUNTER — Other Ambulatory Visit: Payer: Self-pay

## 2020-10-14 ENCOUNTER — Encounter (INDEPENDENT_AMBULATORY_CARE_PROVIDER_SITE_OTHER): Payer: Self-pay | Admitting: Family Medicine

## 2020-10-14 VITALS — BP 105/62 | HR 60 | Temp 98.2°F | Ht 71.0 in | Wt 256.0 lb

## 2020-10-14 DIAGNOSIS — Z6841 Body Mass Index (BMI) 40.0 and over, adult: Secondary | ICD-10-CM

## 2020-10-14 DIAGNOSIS — E860 Dehydration: Secondary | ICD-10-CM

## 2020-10-14 DIAGNOSIS — E559 Vitamin D deficiency, unspecified: Secondary | ICD-10-CM | POA: Diagnosis not present

## 2020-10-14 NOTE — Progress Notes (Signed)
Chief Complaint:   OBESITY Carl Knox is here to discuss his progress with his obesity treatment plan along with follow-up of his obesity related diagnoses. Carl Knox is on following a lower carbohydrate, vegetable and lean protein rich diet plan and states he is following his eating plan approximately 100% of the time.  Today's visit was #: 11 Starting weight: 290 lbs Starting date: 04/26/2019 Today's weight: 256 lbs Today's date: 10/14/2020 Total lbs lost to date: 34 Total lbs lost since last in-office visit: 20  Interim History: Carl Knox continues to do well with weight loss on his Lower carbohydrate plan. He feels well overall and he is happy on his plan. His hunger is controlled.  Subjective:   1. Dehydration Carl Knox's last BUN and creatinine were slightly elevated. He is working on increasing his water intake. He denies feeling lightheaded or dizzy.  2. Vitamin D deficiency Carl Knox's last Vit D level was at 35, which is good but not at ideal goal of 50. He is on multivitamins.  Assessment/Plan:   1. Dehydration Carl Knox will continue to increase his water intake until his urine is very light colored, and we will recheck labs at the end of the year.  2. Vitamin D deficiency Low Vitamin D level contributes to fatigue and are associated with obesity, breast, and colon cancer. Carl Knox will continue multivitamins with Vitamin D and we will continue to monitor as his Vit D can increase with weight loss. He will follow-up for routine testing of Vitamin D, at least 2-3 times per year to avoid over-replacement.  3. Obesity with current BMI 35.7 Carl Knox is currently in the action stage of change. As such, his goal is to continue with weight loss efforts. He has agreed to following a lower carbohydrate, vegetable and lean protein rich diet plan.   Behavioral modification strategies: increasing lean protein intake and increasing water intake.  Carl Knox has agreed to follow-up with our clinic in 2 to 3  months. He was informed of the importance of frequent follow-up visits to maximize his success with intensive lifestyle modifications for his multiple health conditions.   Objective:   Blood pressure 105/62, pulse 60, temperature 98.2 F (36.8 C), height 5\' 11"  (1.803 m), weight 256 lb (116.1 kg), SpO2 99 %. Body mass index is 35.7 kg/m.  General: Cooperative, alert, well developed, in no acute distress. HEENT: Conjunctivae and lids unremarkable. Cardiovascular: Regular rhythm.  Lungs: Normal work of breathing. Neurologic: No focal deficits.   Lab Results  Component Value Date   CREATININE 1.34 (H) 08/19/2020   BUN 26 (H) 08/19/2020   NA 141 08/19/2020   K 5.0 08/19/2020   CL 109 (H) 08/19/2020   CO2 21 08/19/2020   Lab Results  Component Value Date   ALT 19 08/19/2020   AST 26 08/19/2020   ALKPHOS 97 08/19/2020   BILITOT 0.4 08/19/2020   Lab Results  Component Value Date   HGBA1C 5.1 08/19/2020   HGBA1C 5.4 12/24/2019   HGBA1C 5.4 04/26/2019   Lab Results  Component Value Date   INSULIN 4.5 08/19/2020   INSULIN 11.4 12/24/2019   INSULIN 6.1 04/26/2019   Lab Results  Component Value Date   TSH 2.250 08/19/2020   Lab Results  Component Value Date   CHOL 110 08/19/2020   HDL 39 (L) 08/19/2020   LDLCALC 54 08/19/2020   TRIG 85 08/19/2020   Lab Results  Component Value Date   VD25OH 35.3 08/19/2020   VD25OH 34.8 12/24/2019  VD25OH 42.6 08/08/2019   Lab Results  Component Value Date   WBC 4.5 04/15/2020   HGB 14.2 04/15/2020   HCT 41.8 04/15/2020   MCV 77.6 (L) 04/15/2020   PLT 222 04/15/2020   Lab Results  Component Value Date   IRON 74 04/15/2020   TIBC 306 04/15/2020   FERRITIN 300 04/15/2020   Attestation Statements:   Reviewed by clinician on day of visit: allergies, medications, problem list, medical history, surgical history, family history, social history, and previous encounter notes.  Time spent on visit including pre-visit chart  review and post-visit care and charting was 30 minutes.    I, Trixie Dredge, am acting as transcriptionist for Dennard Nip, MD.  I have reviewed the above documentation for accuracy and completeness, and I agree with the above. -  Dennard Nip, MD

## 2020-10-30 ENCOUNTER — Other Ambulatory Visit (HOSPITAL_COMMUNITY): Payer: Self-pay

## 2020-10-30 MED FILL — Folic Acid Tab 1 MG: ORAL | 90 days supply | Qty: 90 | Fill #1 | Status: AC

## 2020-11-03 ENCOUNTER — Ambulatory Visit: Payer: 59 | Admitting: Podiatry

## 2020-11-07 ENCOUNTER — Ambulatory Visit (INDEPENDENT_AMBULATORY_CARE_PROVIDER_SITE_OTHER): Payer: 59

## 2020-11-07 ENCOUNTER — Other Ambulatory Visit: Payer: Self-pay

## 2020-11-07 ENCOUNTER — Encounter: Payer: Self-pay | Admitting: Podiatry

## 2020-11-07 ENCOUNTER — Other Ambulatory Visit: Payer: Self-pay | Admitting: Podiatry

## 2020-11-07 ENCOUNTER — Ambulatory Visit: Payer: 59 | Admitting: Podiatry

## 2020-11-07 DIAGNOSIS — M79674 Pain in right toe(s): Secondary | ICD-10-CM

## 2020-11-07 DIAGNOSIS — M2041 Other hammer toe(s) (acquired), right foot: Secondary | ICD-10-CM

## 2020-11-07 DIAGNOSIS — D169 Benign neoplasm of bone and articular cartilage, unspecified: Secondary | ICD-10-CM

## 2020-11-07 DIAGNOSIS — L600 Ingrowing nail: Secondary | ICD-10-CM

## 2020-11-10 NOTE — Progress Notes (Signed)
Subjective:   Patient ID: Carl Knox, male   DOB: 55 y.o.   MRN: [id number]   HPI Patient presents stating he has had a lot of pain in the outside of his little toe right foot and he has a very thickened dystrophic big toenail left that is increasingly bothersome.  States it is hard to wear shoe gear with and is becoming difficult to cut   ROS      Objective:  Physical Exam  Neurovascular status intact with patient found to have good digital perfusion and significant rotation digit 5 right with distal lateral keratotic exostotic type lesion with structural deformity of the digit and is found to have severely thickened dystrophic big toenail left painful and loose     Assessment:  Digital deformity fifth digit right foot with keratotic lesion that is formed with bone spur formation distal lateral aspect toe along with significant thickness of the left hallux nail and pain     Plan:  H&P reviewed both conditions.  For the right fifth digit debridement and padding accomplished and discussed derotational arthroplasty distal lateral exostectomy which may be necessary.  For the left I recommended nail removal and he is going to get it done when he has a few days off in a couple weeks and education rendered concerning the correction of deformity  X-rays indicate significant rotation digit 5 right with distal deformity of the digit

## 2020-11-11 ENCOUNTER — Other Ambulatory Visit (HOSPITAL_COMMUNITY): Payer: Self-pay

## 2020-11-17 ENCOUNTER — Other Ambulatory Visit (HOSPITAL_COMMUNITY): Payer: Self-pay

## 2020-12-01 ENCOUNTER — Other Ambulatory Visit (HOSPITAL_COMMUNITY): Payer: Self-pay

## 2020-12-01 DIAGNOSIS — E236 Other disorders of pituitary gland: Secondary | ICD-10-CM | POA: Diagnosis not present

## 2020-12-01 DIAGNOSIS — E039 Hypothyroidism, unspecified: Secondary | ICD-10-CM | POA: Diagnosis not present

## 2020-12-01 DIAGNOSIS — Z9889 Other specified postprocedural states: Secondary | ICD-10-CM | POA: Diagnosis not present

## 2020-12-01 DIAGNOSIS — E669 Obesity, unspecified: Secondary | ICD-10-CM | POA: Diagnosis not present

## 2020-12-01 MED ORDER — LEVOTHYROXINE SODIUM 75 MCG PO TABS
75.0000 ug | ORAL_TABLET | Freq: Every morning | ORAL | 4 refills | Status: DC
  Filled 2020-12-01: qty 90, 90d supply, fill #0
  Filled 2021-03-06: qty 90, 90d supply, fill #1
  Filled 2021-06-05: qty 90, 90d supply, fill #2
  Filled 2021-09-04: qty 90, 90d supply, fill #3

## 2020-12-02 ENCOUNTER — Other Ambulatory Visit (HOSPITAL_COMMUNITY): Payer: Self-pay

## 2020-12-05 ENCOUNTER — Ambulatory Visit: Payer: 59 | Admitting: Podiatry

## 2020-12-05 ENCOUNTER — Other Ambulatory Visit: Payer: Self-pay

## 2020-12-05 DIAGNOSIS — M2041 Other hammer toe(s) (acquired), right foot: Secondary | ICD-10-CM | POA: Diagnosis not present

## 2020-12-05 DIAGNOSIS — L600 Ingrowing nail: Secondary | ICD-10-CM

## 2020-12-05 DIAGNOSIS — D169 Benign neoplasm of bone and articular cartilage, unspecified: Secondary | ICD-10-CM

## 2020-12-05 NOTE — Progress Notes (Signed)
Subjective:   Patient ID: Carl Knox, male   DOB: 55 y.o.   MRN: [id number]   HPI Patient presents stating that he is here to have his big toenail removed left and also his right is not doing well and has been in need to have surgery.  States that the trimming only helped for about 10 days   ROS      Objective:  Physical Exam  Neurovascular status intact with patient found to have a severely thickened left hallux nail that is dystrophic painful when pressed and hard to wear shoe gear and has a rotated fifth digit right with distal lateral keratotic lesion that reoccurred very quickly after previous treatment with rotational component of the toe and exostotic tissue     Assessment:  Damaged left hallux nail with dystrophic bed and rotated fifth digit right with distal lateral exostotic keratotic lesion     Plan:  H&P reviewed both conditions.  At this point I have recommended removal of the nail left and I allowed him to read and then signed consent form and today I went ahead and I infiltrated the left hallux 60 mg like Marcaine mixture sterile prep done using sterile instrumentation remove the nail exposed matrix applied phenol 5 applications 30 seconds followed by alcohol lavage and sterile dressing.  Gave instructions on soaks and reappoint.  Discussed the right I have recommended derotational arthroplasty along with distal lateral exostectomy and I explained procedure risk patient wants surgery scheduled for outpatient surgery which would be done in January with padding given today with all questions answered and all review done of complications alternative treatments.  Total recovery from that will take 3 to 6 months

## 2020-12-05 NOTE — Patient Instructions (Signed)

## 2020-12-08 ENCOUNTER — Other Ambulatory Visit: Payer: Self-pay | Admitting: Podiatry

## 2020-12-08 ENCOUNTER — Other Ambulatory Visit (HOSPITAL_COMMUNITY): Payer: Self-pay

## 2020-12-08 ENCOUNTER — Telehealth: Payer: Self-pay | Admitting: *Deleted

## 2020-12-08 MED ORDER — DOXYCYCLINE HYCLATE 100 MG PO TABS
100.0000 mg | ORAL_TABLET | Freq: Two times a day (BID) | ORAL | 1 refills | Status: DC
  Filled 2020-12-08: qty 20, 10d supply, fill #0

## 2020-12-08 NOTE — Telephone Encounter (Signed)
I'll send antibiotic over. If not improved by next week or if anything else he should be seen

## 2020-12-08 NOTE — Telephone Encounter (Signed)
Called patient and gave recommendations to patient's wife and that an antibiotic(doxycycline-100 mg) has been sent to pharmacy on file, verbalized understanding.

## 2020-12-08 NOTE — Telephone Encounter (Signed)
Patient's wife is calling with concerns about procedural toe, nail removed on the 18 th and that it may be infected, is having a lot of yellowish pus draining near nailbed, pain over the weekend but no fever or red streaking going up into the foot.

## 2020-12-16 ENCOUNTER — Telehealth: Payer: Self-pay | Admitting: Podiatry

## 2020-12-16 NOTE — Telephone Encounter (Signed)
Patients Wife called to ask about Disability paperwork. Per Wife surgery is scheduled for 02/10/2021. I told her that I needed to wait for CPT codes before completing paperwork, the surgery coordinator is out of the office until Thursday. I will call patient when paperwork is ready for pick up.

## 2020-12-19 ENCOUNTER — Other Ambulatory Visit: Payer: Self-pay

## 2020-12-19 ENCOUNTER — Encounter: Payer: Self-pay | Admitting: Podiatry

## 2020-12-19 ENCOUNTER — Ambulatory Visit: Payer: 59 | Admitting: Podiatry

## 2020-12-19 DIAGNOSIS — M2041 Other hammer toe(s) (acquired), right foot: Secondary | ICD-10-CM | POA: Diagnosis not present

## 2020-12-19 DIAGNOSIS — L600 Ingrowing nail: Secondary | ICD-10-CM | POA: Diagnosis not present

## 2020-12-19 DIAGNOSIS — D169 Benign neoplasm of bone and articular cartilage, unspecified: Secondary | ICD-10-CM | POA: Diagnosis not present

## 2020-12-19 NOTE — Progress Notes (Signed)
Subjective:   Patient ID: Carl Knox, male   DOB: 55 y.o.   MRN: [id number]   HPI Patient presents stating the left big toe is still sore when I wear shoes and the right fifth toe I wanted it checked the lesion still there and I know him having surgery in January   ROS      Objective:  Physical Exam  Neurovascular status intact left hallux nail bed is healing well slightly inflamed but no active drainage erythema noted and the right fifth digit rotated with distal lateral keratotic lesion     Assessment:  Inflamed left hallux relative normal for this.  Postoperatively and digital deformity with exostotic keratotic lesion     Plan:  Debrided the lesion and continue padding to the fifth digit right reviewing the hammertoe repair necessary and for the left applied cushioning with bandage and should be uneventful and he

## 2021-01-16 ENCOUNTER — Telehealth: Payer: Self-pay | Admitting: Urology

## 2021-01-16 NOTE — Telephone Encounter (Signed)
DOS - 02/10/21  HAMMERTOE REPAIR 5TH RIGHT --- 17127 EXOSTECTOMY 5TH RIGHT --- 87183   [facility] EFFECTIVE DATE - 01/18/21  SPOKE WITH BELL WITH [facility] AND SHE STATED THAT FOR CPT CODES 67255 AND 00164 NO PRIOR AUTH IS REQUIRED.   REF # [id number] - [id number]

## 2021-02-03 ENCOUNTER — Other Ambulatory Visit (HOSPITAL_COMMUNITY): Payer: Self-pay

## 2021-02-03 MED FILL — Folic Acid Tab 1 MG: ORAL | 90 days supply | Qty: 90 | Fill #2 | Status: AC

## 2021-02-09 ENCOUNTER — Other Ambulatory Visit (HOSPITAL_COMMUNITY): Payer: Self-pay

## 2021-02-09 HISTORY — PX: FOOT SURGERY: SHX648

## 2021-02-09 MED ORDER — HYDROCODONE-ACETAMINOPHEN 10-325 MG PO TABS
1.0000 | ORAL_TABLET | Freq: Three times a day (TID) | ORAL | 0 refills | Status: AC | PRN
  Filled 2021-02-09: qty 15, 5d supply, fill #0

## 2021-02-09 NOTE — Addendum Note (Signed)
Addended by: Wallene Huh on: 02/09/2021 01:23 PM   Modules accepted: Orders

## 2021-02-10 ENCOUNTER — Encounter: Payer: Self-pay | Admitting: Podiatry

## 2021-02-10 ENCOUNTER — Other Ambulatory Visit (HOSPITAL_COMMUNITY): Payer: Self-pay

## 2021-02-10 DIAGNOSIS — M25774 Osteophyte, right foot: Secondary | ICD-10-CM | POA: Diagnosis not present

## 2021-02-10 DIAGNOSIS — M25775 Osteophyte, left foot: Secondary | ICD-10-CM | POA: Diagnosis not present

## 2021-02-10 DIAGNOSIS — M2041 Other hammer toe(s) (acquired), right foot: Secondary | ICD-10-CM | POA: Diagnosis not present

## 2021-02-12 ENCOUNTER — Ambulatory Visit: Payer: 59 | Admitting: Allergy & Immunology

## 2021-02-12 ENCOUNTER — Encounter: Payer: Self-pay | Admitting: Allergy & Immunology

## 2021-02-12 ENCOUNTER — Other Ambulatory Visit: Payer: Self-pay

## 2021-02-12 VITALS — BP 120/77 | HR 70 | Temp 97.6°F | Resp 17 | Ht 71.0 in | Wt 273.6 lb

## 2021-02-12 DIAGNOSIS — K148 Other diseases of tongue: Secondary | ICD-10-CM

## 2021-02-12 NOTE — Patient Instructions (Addendum)
1. Tongue lesion - Testing was negative to all of the foods tested today. - There is a the low positive predictive value of food allergy testing and hence the high possibility of false positives. - In contrast, food allergy testing has a high negative predictive value, therefore if testing is negative we can be relatively assured that they are indeed negative.  - Start Zyrtec (cetirizine) 10mg  once daily every day to keep inflammation under control. - Take notes of other triggers. - We can send you to ENT for an evaluation if you are interested .  2. Return in about 6 months (around 08/12/2021).    Please inform us of any Emergency Department visits, hospitalizations, or changes in symptoms. Call us before going to the ED for breathing or allergy symptoms since we might be able to fit you in for a sick visit. Feel free to contact us anytime with any questions, problems, or concerns.  It was a pleasure to meet you today!  Websites that have reliable patient information: 1. American Academy of Asthma, Allergy, and Immunology: www.aaaai.org 2. Food Allergy Research and Education (FARE): foodallergy.org 3. Mothers of Asthmatics: http://www.asthmacommunitynetwork.org 4. American College of Allergy, Asthma, and Immunology: www.acaai.org   COVID-19 Vaccine Information can be found at: ShippingScam.co.uk For questions related to vaccine distribution or appointments, please email [email or web address] or call [phone].   We realize that you might be concerned about having an allergic reaction to the COVID19 vaccines. To help with that concern, WE ARE OFFERING THE COVID19 VACCINES IN OUR OFFICE! Ask the front desk for dates!     Like [place] on [employer] and Instagram for our latest updates!      A healthy democracy works best when [employer] participate! Make sure you are registered to vote! If you have moved or changed any of your  contact information, you will need to get this updated before voting!  In some cases, you MAY be able to register to vote online: [email or web address]      Food Adult Perc - 02/12/21 1500     Time Antigen Placed [place]    Location Back    Number of allergen test 32     Control-buffer 50% Glycerol Negative    Control-Histamine 1 mg/ml 2+    1. Peanut Negative    2. Soybean Negative    3. Wheat Negative    4. Sesame Negative    5. Milk, cow Negative    6. Egg White, Chicken Negative    7. Casein Negative    8. Shellfish Mix Negative    9. Fish Mix Negative    10. Cashew Negative    11. Pecan Food Negative    12. [place] Negative    13. Almond Negative    14. Hazelnut Negative    15. [place] nut Negative    16. Coconut Negative    17. Pistachio Negative    21. Tuna Negative    31. Oat  Negative    34. Rice Negative    38. [place] Meat Negative    39. Chicken Meat Negative    40. Beef Negative    45. Pea, Green/English Negative    49. Onion Negative    51. Carrots Negative    53. Corn Negative    64. Chocolate/Cacao bean Negative    69. Ginger Negative    70. Garlic Negative    71. Pepper, black Negative    72. Mustard Negative

## 2021-02-13 ENCOUNTER — Encounter: Payer: Self-pay | Admitting: Allergy & Immunology

## 2021-02-13 NOTE — Progress Notes (Signed)
NEW PATIENT  Date of Service/Encounter:  02/13/21  Consult requested by: Leeroy Cha, MD   Assessment:   Tongue lesion - seems more consistent with a geographic tongue   His tongue appearance today, which he says is what it looks like when it flares, is consistent with geographic tongue.  Evidently, geographic tongue can change in pattern, which is likely what is happening when he has these reactions.  Thankfully, he has no symptoms of anaphylaxis.  I do think it is anything more serious, but if he is concerned, we did offer him a referral to ENT.  Plan/Recommendations:   1. Tongue lesion - Testing was negative to all of the foods tested today. - There is a the low positive predictive value of food allergy testing and hence the high possibility of false positives. - In contrast, food allergy testing has a high negative predictive value, therefore if testing is negative we can be relatively assured that they are indeed negative.  - Start Zyrtec (cetirizine) 10mg  once daily every day to keep inflammation under control. - Take notes of other triggers. - We can send you to ENT for an evaluation if you are interested .  2. Return in about 6 months (around 08/12/2021).     This note in its entirety was forwarded to the Provider who requested this consultation.  Subjective:   Carl Knox is a 56 y.o. male presenting today for evaluation of  Chief Complaint  Patient presents with   Rash    Carl Knox has a history of the following: Patient Active Problem List   Diagnosis Date Noted   Lumbar radiculopathy 06/09/2020   Lumbar pain 02/25/2020   Inflammatory pseudotumor of orbit 02/25/2020   Sickle cell trait ([place]) 02/25/2020   Vitamin D deficiency 09/25/2019   Class 3 severe obesity with serious comorbidity and body mass index (BMI) of 40.0 to 44.9 in adult ([place]) 09/25/2019   Papilledema 07/11/2018   Obstructive sleep apnea on CPAP 10/12/2016   Pseudotumor  cerebri 07/09/2016   History of obstructive sleep apnea 07/09/2016   Syncope and collapse 02/06/2016    History obtained from: chart review and patient.  Carl Knox was referred by Leeroy Cha, MD.     Candler is a 56 y.o. male presenting for an evaluation of a rash on his tongue . This round started on Friday or so. It started like a pin needle size and it grows. He treats with Benadryl. It worsens with dehydration. He had an operation on your foot and he had to not eat anything after midnight. He was not able to take Benadryl and it has worsened since that time. Normally. He does two doses of Benadryl and if he catches it early, he goes today. He is unsure if it is something in his environmental or something he is eating.    He is on a high protein low carbohydrate diet. This is not new, but it might be happening more since he has switched things up a bit. He has been on a number of different diets, but this one is more meats and less carbs. These lesions are now occurring twice monthly or so.    It does become achey. But it never itches. He has been to urgent care but it was felt to be an "oral infection". He got Kenalog paste which numbed the area up. He got relief because he could not feel it, but it did not clear it up.   Otherwise, there is  no history of other atopic diseases, including asthma, drug allergies, environmental allergies, stinging insect allergies, eczema, urticaria, or contact dermatitis. There is no significant infectious history. Vaccinations are up to date.    Past Medical History: Patient Active Problem List   Diagnosis Date Noted   Lumbar radiculopathy 06/09/2020   Lumbar pain 02/25/2020   Inflammatory pseudotumor of orbit 02/25/2020   Sickle cell trait ([place]) 02/25/2020   Vitamin D deficiency 09/25/2019   Class 3 severe obesity with serious comorbidity and body mass index (BMI) of 40.0 to 44.9 in adult [facility]) 09/25/2019   Papilledema 07/11/2018    Obstructive sleep apnea on CPAP 10/12/2016   Pseudotumor cerebri 07/09/2016   History of obstructive sleep apnea 07/09/2016   Syncope and collapse 02/06/2016    Medication List:  Allergies as of 02/12/2021       Reactions   Valacyclovir Hcl Diarrhea        Medication List        Accurate as of February 12, 2021 11:59 PM. If you have any questions, ask your nurse or doctor.          folic acid 1 MG tablet Commonly known as: FOLVITE TAKE 1 TABLET (1 MG TOTAL) BY MOUTH DAILY.   gabapentin 300 MG capsule Commonly known as: Neurontin Take 1 capsule (300 mg total) by mouth 2 (two) times daily as needed.   HYDROcodone-acetaminophen 10-325 MG tablet Commonly known as: NORCO Take 1 tablet by mouth every 8 (eight) hours as needed for up to 5 days.   levothyroxine 75 MCG tablet Commonly known as: SYNTHROID Take 1 tablet (75 mcg total) by mouth in the morning on an empty stomach What changed: Another medication with the same name was removed. Continue taking this medication, and follow the directions you see here. Changed by: Valentina Shaggy, MD   multivitamin tablet Take 1 tablet by mouth daily.   triamcinolone 0.1 % paste Commonly known as: KENALOG Apply 1 application to the oral lesion in the mouth/throat twice daily for 10 days.   Trokendi XR 100 MG Cp24 Generic drug: Topiramate ER Take 2 capsules by mouth at bedtime        Birth History: non-contributory  Developmental History: non-contributory  Past Surgical History: Past Surgical History:  Procedure Laterality Date   CYST EXCISION  2001   Thyglossal duct   FOOT SURGERY Right 02/09/2021   TONSILLECTOMY  1980     Family History: Family History  Problem Relation Age of Onset   Hypertension Mother    Heart disease Mother    Kidney disease Mother    Hyperlipidemia Mother    Sleep apnea Mother    Obesity Mother    Diabetes Father    Hypertension Father    Hyperlipidemia Father    Heart  disease Father    Kidney disease Father    Obesity Father      Social History: Carl Knox lives at home with his wife.  Lives in a house that is 20 years old.  There is carpeting throughout the home.  He has gas heating and central cooling.  There are no animals inside or outside of the home.  There are dust mite covers on the bed as well as the pillows.  There is no tobacco exposure.  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  He is not exposed to fumes, chemicals, or dust at home.  He does have these exposures at work.  He does live near an  interstate or industrial area. His wife works for [employer].    Review of Systems  Constitutional: Negative.  Negative for fever, malaise/fatigue and weight loss.  HENT: Negative.  Negative for congestion, ear discharge and ear pain.   Eyes:  Negative for pain, discharge and redness.  Respiratory:  Negative for cough, sputum production, shortness of breath and wheezing.   Cardiovascular: Negative.  Negative for chest pain and palpitations.  Gastrointestinal:  Negative for abdominal pain and heartburn.  Skin: Negative.  Negative for itching and rash.  Neurological:  Negative for dizziness and headaches.  Endo/Heme/Allergies:  Negative for environmental allergies. Does not bruise/bleed easily.      Objective:   Blood pressure 120/77, pulse 70, temperature 97.6 F (36.4 C), temperature source Temporal, resp. rate 17, height 5\' 11"  (1.803 m), weight 273 lb 9.6 oz (124.1 kg), SpO2 94 %. Body mass index is 38.16 kg/m.   Physical Exam:   Physical Exam Constitutional:      Appearance: He is well-developed.  HENT:     Head: Normocephalic and atraumatic.     Comments: The tip of the tongue has an appearance like geographic tongue.  Moving    Right Ear: Tympanic membrane, ear canal and external ear normal. No drainage, swelling or tenderness. Tympanic membrane is not injected, scarred, erythematous, retracted or bulging.     Left Ear: Tympanic  membrane, ear canal and external ear normal. No drainage, swelling or tenderness. Tympanic membrane is not injected, scarred, erythematous, retracted or bulging.     Nose: No nasal deformity, septal deviation, mucosal edema or rhinorrhea.     Right Sinus: No maxillary sinus tenderness or frontal sinus tenderness.     Left Sinus: No maxillary sinus tenderness or frontal sinus tenderness.     Mouth/Throat:     Mouth: Mucous membranes are not pale and not dry.     Pharynx: Uvula midline.  Eyes:     General:        Right eye: No discharge.        Left eye: No discharge.     Conjunctiva/sclera: Conjunctivae normal.     Right eye: Right conjunctiva is not injected. No chemosis.    Left eye: Left conjunctiva is not injected. No chemosis.    Pupils: Pupils are equal, round, and reactive to light.  Cardiovascular:     Rate and Rhythm: Normal rate and regular rhythm.     Heart sounds: Normal heart sounds.  Pulmonary:     Effort: Pulmonary effort is normal. No tachypnea, accessory muscle usage or respiratory distress.     Breath sounds: Normal breath sounds. No wheezing, rhonchi or rales.  Chest:     Chest wall: No tenderness.  Abdominal:     Tenderness: There is no abdominal tenderness. There is no guarding or rebound.  Lymphadenopathy:     Head:     Right side of head: No submandibular, tonsillar or occipital adenopathy.     Left side of head: No submandibular, tonsillar or occipital adenopathy.     Cervical: No cervical adenopathy.  Skin:    Coloration: Skin is not pale.     Findings: No abrasion, erythema, petechiae or rash. Rash is not papular, urticarial or vesicular.  Neurological:     Mental Status: He is alert.      Diagnostic studies:   Allergy Studies:     Food Adult Perc - 02/12/21 1500     Time Antigen Placed 1540    Allergen Manufacturer Lavella Hammock  Location Back    Number of allergen test 32     Control-buffer 50% Glycerol Negative    Control-Histamine 1 mg/ml 2+     1. Peanut Negative    2. Soybean Negative    3. Wheat Negative    4. Sesame Negative    5. Milk, cow Negative    6. Egg White, Chicken Negative    7. Casein Negative    8. Shellfish Mix Negative    9. Fish Mix Negative    10. Cashew Negative    11. Pecan Food Negative    12. [place] Negative    13. Almond Negative    14. Hazelnut Negative    15. [place] nut Negative    16. Coconut Negative    17. Pistachio Negative    21. Tuna Negative    31. Oat  Negative    34. Rice Negative    38. [place] Meat Negative    39. Chicken Meat Negative    40. Beef Negative    45. Pea, Green/English Negative    49. Onion Negative    51. Carrots Negative    53. Corn Negative    64. Chocolate/Cacao bean Negative    69. Ginger Negative    70. Garlic Negative    71. Pepper, black Negative    72. Mustard Negative             Allergy testing results were read and interpreted by myself, documented by clinical staff.         Salvatore Marvel, MD [facility] [place] [facility] [place]

## 2021-02-16 ENCOUNTER — Ambulatory Visit (INDEPENDENT_AMBULATORY_CARE_PROVIDER_SITE_OTHER): Payer: 59

## 2021-02-16 ENCOUNTER — Ambulatory Visit (INDEPENDENT_AMBULATORY_CARE_PROVIDER_SITE_OTHER): Payer: 59 | Admitting: Podiatry

## 2021-02-16 ENCOUNTER — Other Ambulatory Visit: Payer: Self-pay

## 2021-02-16 DIAGNOSIS — Z9889 Other specified postprocedural states: Secondary | ICD-10-CM

## 2021-02-16 NOTE — Progress Notes (Signed)
Subjective:   Patient ID: Carl Knox, male   DOB: 56 y.o.   MRN: [id number]   HPI Patient presents stating I am doing well with surgery very pleased   ROS      Objective:  Physical Exam  Neurovascular status intact negative Bevelyn Buckles' sign noted digit 5 right healing well wound edges well coapted stitches intact     Assessment:  Doing well post arthroplasty exostectomy fifth digit right     Plan:  H&P reviewed condition and x-ray recommended continued sterile dressing open toed shoe gear usage and reappoint 2 weeks suture removal earlier if needed  X-rays indicate satisfactory resection of bone good alignment of the fifth digit

## 2021-03-02 ENCOUNTER — Ambulatory Visit (INDEPENDENT_AMBULATORY_CARE_PROVIDER_SITE_OTHER): Payer: 59

## 2021-03-02 ENCOUNTER — Other Ambulatory Visit: Payer: Self-pay

## 2021-03-02 ENCOUNTER — Ambulatory Visit (INDEPENDENT_AMBULATORY_CARE_PROVIDER_SITE_OTHER): Payer: 59 | Admitting: Podiatry

## 2021-03-02 ENCOUNTER — Encounter: Payer: Self-pay | Admitting: Podiatry

## 2021-03-02 DIAGNOSIS — Z13220 Encounter for screening for lipoid disorders: Secondary | ICD-10-CM | POA: Diagnosis not present

## 2021-03-02 DIAGNOSIS — G4733 Obstructive sleep apnea (adult) (pediatric): Secondary | ICD-10-CM | POA: Diagnosis not present

## 2021-03-02 DIAGNOSIS — E039 Hypothyroidism, unspecified: Secondary | ICD-10-CM | POA: Diagnosis not present

## 2021-03-02 DIAGNOSIS — M47816 Spondylosis without myelopathy or radiculopathy, lumbar region: Secondary | ICD-10-CM | POA: Diagnosis not present

## 2021-03-02 DIAGNOSIS — Z9889 Other specified postprocedural states: Secondary | ICD-10-CM

## 2021-03-02 DIAGNOSIS — M5431 Sciatica, right side: Secondary | ICD-10-CM | POA: Diagnosis not present

## 2021-03-02 DIAGNOSIS — G932 Benign intracranial hypertension: Secondary | ICD-10-CM | POA: Diagnosis not present

## 2021-03-02 DIAGNOSIS — E236 Other disorders of pituitary gland: Secondary | ICD-10-CM | POA: Diagnosis not present

## 2021-03-02 DIAGNOSIS — Z0000 Encounter for general adult medical examination without abnormal findings: Secondary | ICD-10-CM | POA: Diagnosis not present

## 2021-03-02 DIAGNOSIS — Z125 Encounter for screening for malignant neoplasm of prostate: Secondary | ICD-10-CM | POA: Diagnosis not present

## 2021-03-02 NOTE — Progress Notes (Signed)
Subjective:   Patient ID: Carl Knox, male   DOB: 56 y.o.   MRN: [id number]   HPI Patient states doing well on my right fifth toe and feeling good   ROS      Objective:  Physical Exam  Neurovascular status intact negative Bevelyn Buckles' sign noted wound edges coapted well stitches intact good alignment fifth toe     Assessment:  Doing well post arthroplasty exostectomy right     Plan:  Stitches removed wound edges coapted well advised on gradual return to soft shoe that may be several more weeks until steel toes are comfortable and patient will be seen back as needed  X-rays indicate satisfactory removal of bone no pathology noted

## 2021-03-06 ENCOUNTER — Other Ambulatory Visit (HOSPITAL_COMMUNITY): Payer: Self-pay

## 2021-03-06 DIAGNOSIS — G932 Benign intracranial hypertension: Secondary | ICD-10-CM | POA: Diagnosis not present

## 2021-03-09 NOTE — Progress Notes (Signed)
PATIENT: Carl Knox DOB: 1965/04/28  REASON FOR VISIT: Follow up for pseudotumor cerebri  HISTORY FROM: Patient PRIMARY NEUROLOGIST: Dr. Jaynee Eagles   HISTORY OF PRESENT ILLNESS: Today 03/10/21 [name] here today for follow-up with history of pseudotumor cerebri with papilledema.  Remains on Trokendi 200 mg PM.  Has lumbar spine issues, MRI has shown potential for right L5 nerve root impingement. Managed through Dr. Tonita Cong and Dr. Nelva Bush does injections with good benefit, last injection was September 05 2020, none needed since then. Denies any recent syncopal episodes. Left foot injury previously, had left 5th toe nail removed, podiatrist found right toe hammer toe, surgery Jan 2023, has done well. 8-10 lb weight loss in last year, total 35 lbs last 2 years. No syncopal spells since last saw Dr. Jannifer Franklin. 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 In past tried to reduce the dose, stopped it for 1 year, then started having more syncopal spells, that has been indicator of high ICP. Creatinine was 1.36, recheck in 2 months. Here today with his wife, has 2 grown daughters.   HISTORY  09/04/2020 Dr. Jannifer Franklin: [name] is a 56 year old left-handed black male with a history of pseudotumor with papilledema.  He has been followed through his ophthalmologist, his most recent eye examination revealed resolution of the papilledema.  He has been on Trokendi taking 200 mg in the evening.  He had a lumbar puncture done in February 2022, opening pressure was 28 cm of water.  The patient unfortunately began having some right leg pain and back pain following the lumbar puncture.  MRI of the lumbar spine showed evidence of a disc bulge with a potential for right L5 nerve root impingement.  He has been followed through Dr. Tonita Cong, and he has had injections through Dr. Nelva Bush.  Injections do help some, but the benefit lasts only about a month or so.  He notes that it is painful for him to sit, but he feels better when he  is up and walking and feels better when he is lying down.  He is sleeping fairly well at night.  He is on gabapentin 300 mg twice daily.  He returns for an evaluation.  REVIEW OF SYSTEMS: Out of a complete 14 system review of symptoms, the patient complains only of the following symptoms, and all other reviewed systems are negative.  See HPI  ALLERGIES: Allergies  Allergen Reactions   Valacyclovir Hcl Diarrhea    HOME MEDICATIONS: Outpatient Medications Prior to Visit  Medication Sig Dispense Refill   cetirizine (ZYRTEC) 10 MG tablet Take 1 tablet by mouth daily.     folic acid (FOLVITE) 1 MG tablet TAKE 1 TABLET (1 MG TOTAL) BY MOUTH DAILY. 90 tablet 9   levothyroxine (SYNTHROID) 75 MCG tablet Take 1 tablet (75 mcg total) by mouth in the morning on an empty stomach 90 tablet 4   Multiple Vitamin (MULTIVITAMIN) tablet Take 1 tablet by mouth daily.     Topiramate ER (TROKENDI XR) 100 MG CP24 Take 2 capsules by mouth at bedtime 180 capsule 2   triamcinolone (KENALOG) 0.1 % paste Apply 1 application to the oral lesion in the mouth/throat twice daily for 10 days. 5 g 0   No facility-administered medications prior to visit.    PAST MEDICAL HISTORY: Past Medical History:  Diagnosis Date   Hypothyroidism    Morbid obesity ([place])    OSA on CPAP    Other specified disorders of thyroid  Papilledema    Pseudotumor cerebri    Sickle cell trait (HCC)    Syncope and collapse 02/06/2016    PAST SURGICAL HISTORY: Past Surgical History:  Procedure Laterality Date   CYST EXCISION  2001   Thyglossal duct   FOOT SURGERY Right 02/09/2021   TONSILLECTOMY  1980    FAMILY HISTORY: Family History  Problem Relation Age of Onset   Hypertension Mother    Heart disease Mother    Kidney disease Mother    Hyperlipidemia Mother    Sleep apnea Mother    Obesity Mother    Diabetes Father    Hypertension Father    Hyperlipidemia Father    Heart disease Father    Kidney disease Father     Obesity Father     SOCIAL HISTORY: Social History   Socioeconomic History   Marital status: Married    Spouse name: Darlene   Number of children: 2   Years of education: Some grad school   Highest education level: Not on file  Occupational History   Occupation: Heavy Glass blower/designer    Comment: full time  Tobacco Use   Smoking status: Never    Passive exposure: Never   Smokeless tobacco: Never  Vaping Use   Vaping Use: Never used  Substance and Sexual Activity   Alcohol use: Yes    Alcohol/week: 0.0 standard drinks    Comment: Occasional   Drug use: No   Sexual activity: Not on file    Comment: Married  Other Topics Concern   Not on file  Social History Narrative   Lives at home w/ his wife   Left-handed   Caffeine: 2 cups daily   Social Determinants of Health   Financial Resource Strain: Not on file  Food Insecurity: Not on file  Transportation Needs: Not on file  Physical Activity: Not on file  Stress: Not on file  Social Connections: Not on file  Intimate Partner Violence: Not on file   PHYSICAL EXAM  Vitals:   03/10/21 1525  BP: 110/71  Pulse: (!) 59  Weight: 282 lb (127.9 kg)  Height: 5\' 11"  (1.803 m)   Body mass index is 39.33 kg/m.  Generalized: Well developed, in no acute distress   Neurological examination  Mentation: Alert oriented to time, place, history taking. Follows all commands speech and language fluent Cranial nerve II-XII: Pupils were equal round reactive to light. Extraocular movements were full, visual field were full on confrontational test. Facial sensation and strength were normal. Head turning and shoulder shrug  were normal and symmetric. Motor: The motor testing reveals 5 over 5 strength of all 4 extremities. Good symmetric motor tone is noted throughout.  Sensory: Sensory testing is intact to soft touch on all 4 extremities. No evidence of extinction is noted.  Coordination: Cerebellar testing reveals good finger-nose-finger  and heel-to-shin bilaterally.  Gait and station: Gait is normal. Reflexes: Deep tendon reflexes are symmetric and normal bilaterally.   DIAGNOSTIC DATA (LABS, IMAGING, TESTING) - I reviewed patient records, labs, notes, testing and imaging myself where available.  Lab Results  Component Value Date   WBC 4.5 04/15/2020   HGB 14.2 04/15/2020   HCT 41.8 04/15/2020   MCV 77.6 (L) 04/15/2020   PLT 222 04/15/2020      Component Value Date/Time   NA 141 08/19/2020 0743   K 5.0 08/19/2020 0743   CL 109 (H) 08/19/2020 0743   CO2 21 08/19/2020 0743   GLUCOSE 85 08/19/2020 0743  BUN 26 (H) 08/19/2020 0743   CREATININE 1.34 (H) 08/19/2020 0743   CALCIUM 9.4 08/19/2020 0743   PROT 7.0 08/19/2020 0743   ALBUMIN 4.3 08/19/2020 0743   AST 26 08/19/2020 0743   ALT 19 08/19/2020 0743   ALKPHOS 97 08/19/2020 0743   BILITOT 0.4 08/19/2020 0743   GFRNONAA 64 12/24/2019 0911   GFRAA 74 12/24/2019 0911   Lab Results  Component Value Date   CHOL 110 08/19/2020   HDL 39 (L) 08/19/2020   LDLCALC 54 08/19/2020   TRIG 85 08/19/2020   Lab Results  Component Value Date   HGBA1C 5.1 08/19/2020   Lab Results  Component Value Date   VITAMINB12 1,067 04/26/2019   Lab Results  Component Value Date   TSH 2.250 08/19/2020    ASSESSMENT AND PLAN 56 y.o. year old male   26.  Pseudotumor cerebri 2.  Low back pain   -Mr. Stacks is doing very well currently, is a delightful patient -Got a note from ophthalmology, eye exam last week showed no papilledema on Trokendi XR 200 mg at bedtime -Will continue Trokendi at current dosing, in the past has tried to wean off and had return of symptoms (presented as syncopal episodes) -Encouraged to continue weight loss, over the last 2 years, has lost about 35 pounds -Back pain is under good control, will continue to see orthopedics as needed -Follow-up in 6 months or sooner if needed, will be followed by Dr. Jaynee Eagles since Dr. Jannifer Franklin has retired  Butler Denmark, AGNP-C, [place] 03/10/2021, 3:50 PM [facility] [address], [place], [place] [address] [phone]

## 2021-03-10 ENCOUNTER — Ambulatory Visit: Payer: 59 | Admitting: Neurology

## 2021-03-10 ENCOUNTER — Other Ambulatory Visit (HOSPITAL_COMMUNITY): Payer: Self-pay

## 2021-03-10 ENCOUNTER — Encounter: Payer: Self-pay | Admitting: Neurology

## 2021-03-10 ENCOUNTER — Telehealth: Payer: Self-pay | Admitting: Neurology

## 2021-03-10 VITALS — BP 110/71 | HR 59 | Ht 71.0 in | Wt 282.0 lb

## 2021-03-10 DIAGNOSIS — G932 Benign intracranial hypertension: Secondary | ICD-10-CM | POA: Diagnosis not present

## 2021-03-10 DIAGNOSIS — K146 Glossodynia: Secondary | ICD-10-CM | POA: Insufficient documentation

## 2021-03-10 DIAGNOSIS — E039 Hypothyroidism, unspecified: Secondary | ICD-10-CM | POA: Insufficient documentation

## 2021-03-10 DIAGNOSIS — M5416 Radiculopathy, lumbar region: Secondary | ICD-10-CM

## 2021-03-10 DIAGNOSIS — M47817 Spondylosis without myelopathy or radiculopathy, lumbosacral region: Secondary | ICD-10-CM | POA: Insufficient documentation

## 2021-03-10 DIAGNOSIS — Z1210 Encounter for screening for malignant neoplasm of intestinal tract, unspecified: Secondary | ICD-10-CM | POA: Insufficient documentation

## 2021-03-10 DIAGNOSIS — M5430 Sciatica, unspecified side: Secondary | ICD-10-CM | POA: Insufficient documentation

## 2021-03-10 DIAGNOSIS — E320 Persistent hyperplasia of thymus: Secondary | ICD-10-CM | POA: Insufficient documentation

## 2021-03-10 DIAGNOSIS — C61 Malignant neoplasm of prostate: Secondary | ICD-10-CM | POA: Insufficient documentation

## 2021-03-10 DIAGNOSIS — E237 Disorder of pituitary gland, unspecified: Secondary | ICD-10-CM | POA: Insufficient documentation

## 2021-03-10 DIAGNOSIS — Z6841 Body Mass Index (BMI) 40.0 and over, adult: Secondary | ICD-10-CM | POA: Insufficient documentation

## 2021-03-10 DIAGNOSIS — E230 Hypopituitarism: Secondary | ICD-10-CM | POA: Insufficient documentation

## 2021-03-10 DIAGNOSIS — K5900 Constipation, unspecified: Secondary | ICD-10-CM | POA: Insufficient documentation

## 2021-03-10 DIAGNOSIS — R7989 Other specified abnormal findings of blood chemistry: Secondary | ICD-10-CM | POA: Insufficient documentation

## 2021-03-10 MED ORDER — TOPIRAMATE ER 100 MG PO CAP24
200.0000 mg | ORAL_CAPSULE | Freq: Every day | ORAL | 2 refills | Status: DC
Start: 2021-03-10 — End: 2021-12-16
  Filled 2021-03-10: qty 180, 90d supply, fill #0
  Filled 2021-06-12: qty 180, 90d supply, fill #1
  Filled 2021-07-27 – 2021-09-11 (×2): qty 180, 90d supply, fill #2

## 2021-03-10 NOTE — Telephone Encounter (Signed)
Received a note from [facility] ophthalmology, Dr. Jola Schmidt, Mr. Chuba had an exam in March 06, 2021, there was no papilledema.  Follow-up in 6 months.

## 2021-03-10 NOTE — Patient Instructions (Signed)
Great to see you today Continue current medications Call for any worsening symptoms See your eye doctor in 6 months See you back in 6 months with Dr .Jaynee Eagles

## 2021-03-17 ENCOUNTER — Encounter: Payer: Self-pay | Admitting: Podiatry

## 2021-05-05 ENCOUNTER — Other Ambulatory Visit (HOSPITAL_COMMUNITY): Payer: Self-pay

## 2021-05-05 DIAGNOSIS — G4733 Obstructive sleep apnea (adult) (pediatric): Secondary | ICD-10-CM | POA: Diagnosis not present

## 2021-05-05 DIAGNOSIS — N289 Disorder of kidney and ureter, unspecified: Secondary | ICD-10-CM | POA: Diagnosis not present

## 2021-05-05 DIAGNOSIS — H43391 Other vitreous opacities, right eye: Secondary | ICD-10-CM | POA: Diagnosis not present

## 2021-05-05 DIAGNOSIS — E039 Hypothyroidism, unspecified: Secondary | ICD-10-CM | POA: Diagnosis not present

## 2021-05-05 DIAGNOSIS — L739 Follicular disorder, unspecified: Secondary | ICD-10-CM | POA: Diagnosis not present

## 2021-05-05 DIAGNOSIS — M47816 Spondylosis without myelopathy or radiculopathy, lumbar region: Secondary | ICD-10-CM | POA: Diagnosis not present

## 2021-05-05 MED ORDER — MUPIROCIN 2 % EX OINT
1.0000 "application " | TOPICAL_OINTMENT | Freq: Two times a day (BID) | CUTANEOUS | 1 refills | Status: AC | PRN
  Filled 2021-05-05: qty 22, 10d supply, fill #0

## 2021-05-06 DIAGNOSIS — H4710 Unspecified papilledema: Secondary | ICD-10-CM | POA: Diagnosis not present

## 2021-05-06 DIAGNOSIS — H43811 Vitreous degeneration, right eye: Secondary | ICD-10-CM | POA: Diagnosis not present

## 2021-05-10 DIAGNOSIS — U071 COVID-19: Secondary | ICD-10-CM | POA: Diagnosis not present

## 2021-05-10 DIAGNOSIS — S20462A Insect bite (nonvenomous) of left back wall of thorax, initial encounter: Secondary | ICD-10-CM | POA: Diagnosis not present

## 2021-05-11 ENCOUNTER — Other Ambulatory Visit (HOSPITAL_COMMUNITY): Payer: Self-pay

## 2021-05-11 ENCOUNTER — Other Ambulatory Visit: Payer: Self-pay | Admitting: Hematology & Oncology

## 2021-05-12 ENCOUNTER — Other Ambulatory Visit (HOSPITAL_COMMUNITY): Payer: Self-pay

## 2021-05-12 MED ORDER — FOLIC ACID 1 MG PO TABS
1.0000 mg | ORAL_TABLET | Freq: Every day | ORAL | 9 refills | Status: DC
Start: 2021-05-12 — End: 2022-06-02
  Filled 2021-05-12: qty 90, 90d supply, fill #0
  Filled 2021-08-21: qty 90, 90d supply, fill #1
  Filled 2021-12-04: qty 90, 90d supply, fill #2
  Filled 2022-03-08: qty 90, 90d supply, fill #3

## 2021-06-05 ENCOUNTER — Other Ambulatory Visit (HOSPITAL_COMMUNITY): Payer: Self-pay

## 2021-06-11 DIAGNOSIS — Z6825 Body mass index (BMI) 25.0-25.9, adult: Secondary | ICD-10-CM | POA: Diagnosis not present

## 2021-06-11 DIAGNOSIS — A770 Spotted fever due to Rickettsia rickettsii: Secondary | ICD-10-CM | POA: Diagnosis not present

## 2021-06-12 ENCOUNTER — Other Ambulatory Visit (HOSPITAL_COMMUNITY): Payer: Self-pay

## 2021-07-27 ENCOUNTER — Other Ambulatory Visit (HOSPITAL_COMMUNITY): Payer: Self-pay

## 2021-07-28 DIAGNOSIS — H9192 Unspecified hearing loss, left ear: Secondary | ICD-10-CM | POA: Diagnosis not present

## 2021-07-28 DIAGNOSIS — H6503 Acute serous otitis media, bilateral: Secondary | ICD-10-CM | POA: Diagnosis not present

## 2021-07-28 DIAGNOSIS — J301 Allergic rhinitis due to pollen: Secondary | ICD-10-CM | POA: Diagnosis not present

## 2021-07-28 DIAGNOSIS — H9312 Tinnitus, left ear: Secondary | ICD-10-CM | POA: Diagnosis not present

## 2021-07-29 ENCOUNTER — Telehealth: Payer: Self-pay | Admitting: Neurology

## 2021-07-29 NOTE — Telephone Encounter (Signed)
I returned the pt's wife call and we discussed message.   She sts over the last month the pt has had noticeable hearing loss. Reports he failed his hearing test at [facility] and made a f/u with PCP.  Upon assessment with PCP, fluid was noted bilaterally in the ears. He is scheduled for ENT consult and audiology test in Sept.   Over the last 1.5 to 2 weeks the pt has been complaining of head fullness and pressure and was concerned this could be coming from changes with the pseudotumor cerebri. I advised I would send message to NP, but this could likely be from the fluid build up in the ears.   He does have f/u schedule with Dr. Jaynee Eagles on 09/09/2021 and had f/u with ophthalmology 2 months ago and no concerns noted on assessment.

## 2021-07-29 NOTE — Telephone Encounter (Signed)
Pt wife is calling and said she have some questions for a nurse and would like a return call.

## 2021-07-30 NOTE — Telephone Encounter (Signed)
Carl Cloud, NP  You 16 hours ago (4:35 PM)    Agree with nursing assessment.I would get another eye evaluation to ensure no papilledema since symptoms started after most recent eye exam. Make sure no episodes of syncope with laughing that have been signs of IIH in the past. Also make sure still on Trokendi. I would encourage them to see if ENT appointment can be moved up any sooner. Keep [place] posted.   I called the pt's wife and relayed message. She confirmed pt is taking trokendi as rx'd, denies laughing/syncope, and will follow up with eye doc for repeat assessment. Will keep [place] up to date on how he is doing.

## 2021-07-31 DIAGNOSIS — H43811 Vitreous degeneration, right eye: Secondary | ICD-10-CM | POA: Diagnosis not present

## 2021-07-31 DIAGNOSIS — G932 Benign intracranial hypertension: Secondary | ICD-10-CM | POA: Diagnosis not present

## 2021-08-13 ENCOUNTER — Encounter: Payer: Self-pay | Admitting: Allergy & Immunology

## 2021-08-13 ENCOUNTER — Ambulatory Visit: Payer: 59 | Admitting: Allergy & Immunology

## 2021-08-13 VITALS — BP 116/74 | HR 62 | Temp 98.2°F | Resp 16 | Wt 291.2 lb

## 2021-08-13 DIAGNOSIS — J301 Allergic rhinitis due to pollen: Secondary | ICD-10-CM | POA: Diagnosis not present

## 2021-08-13 DIAGNOSIS — T783XXD Angioneurotic edema, subsequent encounter: Secondary | ICD-10-CM

## 2021-08-13 NOTE — Patient Instructions (Addendum)
1. Tongue lesion - Continue with Claritin '10mg'$  daily. - I hope that the ENT visit is enlightening.  - tI am still confused by this whole presentation.   2. Return in about 6 months (around 02/13/2022).    Please inform us of any Emergency Department visits, hospitalizations, or changes in symptoms. Call us before going to the ED for breathing or allergy symptoms since we might be able to fit you in for a sick visit. Feel free to contact us anytime with any questions, problems, or concerns.  It was a pleasure to meet you today!  Websites that have reliable patient information: 1. American Academy of Asthma, Allergy, and Immunology: www.aaaai.org 2. Food Allergy Research and Education (FARE): foodallergy.org 3. Mothers of Asthmatics: http://www.asthmacommunitynetwork.org 4. American College of Allergy, Asthma, and Immunology: www.acaai.org   COVID-19 Vaccine Information can be found at: ShippingScam.co.uk For questions related to vaccine distribution or appointments, please email [email or web address] or call [phone].   We realize that you might be concerned about having an allergic reaction to the COVID19 vaccines. To help with that concern, WE ARE OFFERING THE COVID19 VACCINES IN OUR OFFICE! Ask the front desk for dates!     "Like" [place] on Facebook and Instagram for our latest updates!      A healthy democracy works best when [employer] participate! Make sure you are registered to vote! If you have moved or changed any of your contact information, you will need to get this updated before voting!  In some cases, you MAY be able to register to vote online: [email or web address]

## 2021-08-13 NOTE — Progress Notes (Signed)
FOLLOW UP  Date of Service/Encounter:  08/13/21   Assessment:   Tongue lesion - seems more consistent with a geographic tongue, but ENT appointment pending  Perennial and seasonal allergic rhinitis (grasses, trees, ragweed)  Negative workup for HAE and alpha gal  Plan/Recommendations:   1. Tongue lesion - Continue with Claritin '10mg'$  daily. - I hope that the ENT visit is enlightening.  - tI am still confused by this whole presentation.   2. Return in about 6 months (around 02/13/2022).    Subjective:   Carl Knox is a 56 y.o. 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   Hypogonadotropic hypogonadism ([place]) 03/10/2021   Low testosterone 03/10/2021   Lumbosacral spondylosis without myelopathy 03/10/2021   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   Prostate cancer ([place]) 03/10/2021   Sciatica 03/10/2021   Screening for intestinal cancer 03/10/2021   Lumbar radiculopathy 06/09/2020   Lumbar pain 02/25/2020   Inflammatory pseudotumor of orbit 02/25/2020   Sickle cell trait ([place]) 02/25/2020   Vitamin D deficiency 09/25/2019   Class 3 severe obesity with serious comorbidity and body mass index (BMI) of 40.0 to 44.9 in adult ([place]) 09/25/2019   Papilledema 07/11/2018   Obstructive sleep apnea on CPAP 10/12/2016   Pseudotumor cerebri 07/09/2016   History of obstructive sleep apnea 07/09/2016   Syncope and collapse 02/06/2016    History obtained from: chart review and patient and wife .  Neurologist: Carl Knox Ophthamologist: Carl Knox  Endocrinologist: Carl Knox ENT: Carl Knox is a 56 y.o. male presenting for a follow up visit.  He was last seen as a new patient in January 2023.  At that time, he came for evaluation of a tongue lesion which I felt was consistent with geographic tongue.  We did testing to foods that he requested and this was all unsurprisingly negative.  We started Zyrtec 10 mg daily and asked him to take note of his triggers.  We did talk about sending him to ENT for an evaluation as well. This is not necessarily with meal times at all.   Since last visit, he has mostly done well. They did change to Claritin and things got better. It continues to flare up intermittently. The Claritin seems to be helping and he occasionally has to take a Benadryl. There is no swelling of the lips and no swelling of the tongue. The tongue episodes were happening when he was dehydrated from what they could tell. It was weekly for a while. Now it is every 2-3 weeks. He does not have pictures today.   He is now having problems with his ears. He is having a lot ringing in the left one. He is also having a lot of muscle spasm. He is not having dizziness at  all. He did have some fluid on both ears. There was no infection. This is a new problem. He does not use a nose spray and has never needed prednisone.   He does have a history of pseudotumor. An ophthalmology eval  was stable. He has an appointment with Neurology on August 18th. He is currently on Trokendi (topiramate) '100mg'$  every evening for this. He is followed by Ophthalmology and Neurology.   He did have some pollen allergies as a kid but nothing to severe. We did not do testing for environmental allergies at the last visit since symptoms were not severe at all.   We did food testing to 32 foods at his first visit in January. These were all negative.   He is going to be seeing Carl. Carl Knox. Now this is scheduled for August 24th.   Daughter has angioedema with iron  infusions. Otherwise there is no family history of angioedema whatsoever. His swelling episodes only involve the tongue and do not progress beyond that.   Otherwise, there have been no changes to his past medical history, surgical history, family history, or social history.    Review of Systems  Constitutional: Negative.  Negative for fever, malaise/fatigue and weight loss.  HENT: Negative.  Negative for congestion, ear discharge and ear pain.   Eyes:  Negative for pain, discharge and redness.  Respiratory:  Negative for cough, sputum production, shortness of breath and wheezing.   Cardiovascular: Negative.  Negative for chest pain and palpitations.  Gastrointestinal:  Negative for abdominal pain and heartburn.  Skin: Negative.  Negative for itching and rash.  Neurological:  Negative for dizziness and headaches.  Endo/Heme/Allergies:  Negative for environmental allergies. Does not bruise/bleed easily.       Objective:   Blood pressure 116/74, pulse 62, temperature 98.2 F (36.8 C), resp. rate 16, weight 291 lb 4 oz (132.1 kg), SpO2 96 %. Body mass index is 40.62 kg/m.    Physical Exam Constitutional:      Appearance: He is well-developed.  HENT:     Head: Normocephalic and atraumatic.     Comments: Tongue appears normal today with some delineations consistent with geographic tongue. There is no obvious swelling at all today.     Right Ear: Tympanic membrane, ear canal and external ear normal. No drainage, swelling or tenderness. Tympanic membrane is not injected, scarred, erythematous, retracted or bulging.     Left Ear: Tympanic membrane, ear canal and external ear normal. No drainage, swelling or tenderness. Tympanic membrane is not injected, scarred, erythematous, retracted or bulging.     Nose: No nasal deformity, septal deviation, mucosal edema or rhinorrhea.     Right Sinus: No maxillary sinus tenderness or frontal sinus tenderness.     Left Sinus: No maxillary sinus  tenderness or frontal sinus tenderness.     Mouth/Throat:     Mouth: Mucous membranes are not pale and not dry.     Pharynx: Uvula midline.  Eyes:     General:        Right eye: No discharge.        Left eye: No discharge.     Conjunctiva/sclera: Conjunctivae normal.     Right eye: Right conjunctiva is not injected. No chemosis.    Left eye: Left conjunctiva is not injected. No chemosis.    Pupils: Pupils are equal, round, and reactive to light.  Cardiovascular:     Rate and Rhythm: Normal rate and regular rhythm.     Heart sounds: Normal heart sounds.  Pulmonary:     Effort: Pulmonary effort is normal. No tachypnea, accessory muscle usage or respiratory distress.     Breath sounds: Normal breath sounds. No wheezing, rhonchi or rales.  Chest:     Chest wall:  No tenderness.  Abdominal:     Tenderness: There is no abdominal tenderness.  Lymphadenopathy:     Head:     Right side of head: No submandibular, tonsillar or occipital adenopathy.     Left side of head: No submandibular, tonsillar or occipital adenopathy.     Cervical: No cervical adenopathy.  Skin:    Coloration: Skin is not pale.     Findings: No abrasion, erythema, petechiae or rash. Rash is not papular, urticarial or vesicular.  Neurological:     Mental Status: He is alert.      Diagnostic studies: none (holding off to see what Carl. Carl Knox thinks)        Salvatore Marvel, MD  [facility]

## 2021-08-16 LAB — ALLERGENS W/COMP RFLX AREA 2
Alternaria Alternata IgE: <0.1 kU/L
Aspergillus Fumigatus IgE: <0.1 kU/L
Bermuda Grass IgE: <0.1 kU/L
Cedar, Mountain IgE: 0.16 kU/L — AB
Cladosporium Herbarum IgE: <0.1 kU/L
Cockroach, German IgE: <0.1 kU/L
Common Silver Birch IgE: 0.32 kU/L — AB
Cottonwood IgE: 0.16 kU/L — AB
D Farinae IgE: <0.1 kU/L
D Pteronyssinus IgE: <0.1 kU/L
E001-IgE Cat Dander: <0.1 kU/L
E005-IgE Dog Dander: <0.1 kU/L
Elm, American IgE: 0.29 kU/L — AB
IgE (Immunoglobulin E), Serum: 12 IU/mL (ref 6–495)
Johnson Grass IgE: 0.24 kU/L — AB
Maple/Box Elder IgE: <0.1 kU/L
Mouse Urine IgE: <0.1 kU/L
Oak, White IgE: 0.43 kU/L — AB
Pecan, Hickory IgE: 0.12 kU/L — AB
Penicillium Chrysogen IgE: <0.1 kU/L
Pigweed, Rough IgE: <0.1 kU/L
Ragweed, Short IgE: 0.12 kU/L — AB
Sheep Sorrel IgE Qn: <0.1 kU/L
Timothy Grass IgE: 0.48 kU/L — AB
White Mulberry IgE: <0.1 kU/L

## 2021-08-16 LAB — ALLERGEN PROFILE, MOLD
Aureobasidi Pullulans IgE: <0.1 kU/L
Candida Albicans IgE: <0.1 kU/L
M009-IgE Fusarium proliferatum: <0.1 kU/L
M014-IgE Epicoccum purpur: <0.1 kU/L
Mucor Racemosus IgE: <0.1 kU/L
Phoma Betae IgE: <0.1 kU/L
Setomelanomma Rostrat: <0.1 kU/L
Stemphylium Herbarum IgE: <0.1 kU/L

## 2021-08-16 LAB — ALPHA-GAL PANEL
Allergen Lamb IgE: <0.1 kU/L
Beef IgE: <0.1 kU/L
IgE (Immunoglobulin E), Serum: 14 IU/mL (ref 6–495)
O215-IgE Alpha-Gal: <0.1 kU/L
Pork IgE: <0.1 kU/L

## 2021-08-18 ENCOUNTER — Encounter: Payer: Self-pay | Admitting: Allergy & Immunology

## 2021-08-21 ENCOUNTER — Other Ambulatory Visit (HOSPITAL_COMMUNITY): Payer: Self-pay

## 2021-08-21 LAB — COMPLEMENT COMPONENT C1Q: Complement C1Q: 11.4 mg/dL (ref 10.2–20.3)

## 2021-08-21 LAB — C1 ESTERASE INHIBITOR: C1INH SerPl-mCnc: 27 mg/dL (ref 21–39)

## 2021-08-21 LAB — C3 AND C4
Complement C3, Serum: 124 mg/dL (ref 82–167)
Complement C4, Serum: 29 mg/dL (ref 12–38)

## 2021-08-21 LAB — C1 ESTERASE INHIBITOR, FUNCTIONAL: C1INH Functional/C1INH Total MFr SerPl: >93 %mean normal

## 2021-08-21 LAB — TRYPTASE: Tryptase: 4.3 ug/L (ref 2.2–13.2)

## 2021-09-04 ENCOUNTER — Other Ambulatory Visit (HOSPITAL_COMMUNITY): Payer: Self-pay

## 2021-09-04 DIAGNOSIS — G932 Benign intracranial hypertension: Secondary | ICD-10-CM | POA: Diagnosis not present

## 2021-09-04 DIAGNOSIS — K140 Glossitis: Secondary | ICD-10-CM | POA: Diagnosis not present

## 2021-09-04 DIAGNOSIS — E039 Hypothyroidism, unspecified: Secondary | ICD-10-CM | POA: Diagnosis not present

## 2021-09-04 DIAGNOSIS — Z8669 Personal history of other diseases of the nervous system and sense organs: Secondary | ICD-10-CM | POA: Diagnosis not present

## 2021-09-09 ENCOUNTER — Encounter: Payer: Self-pay | Admitting: Neurology

## 2021-09-09 ENCOUNTER — Ambulatory Visit: Payer: 59 | Admitting: Neurology

## 2021-09-09 VITALS — BP 118/70 | HR 59 | Ht 71.0 in | Wt 290.0 lb

## 2021-09-09 DIAGNOSIS — H938X1 Other specified disorders of right ear: Secondary | ICD-10-CM

## 2021-09-09 DIAGNOSIS — H9311 Tinnitus, right ear: Secondary | ICD-10-CM | POA: Diagnosis not present

## 2021-09-09 DIAGNOSIS — G932 Benign intracranial hypertension: Secondary | ICD-10-CM | POA: Diagnosis not present

## 2021-09-09 NOTE — Patient Instructions (Addendum)
Continue Trokendi Make appointment with [place] after the next eye appointment If no resolution with ear ringing or fullness in left ear, I would repeat MRI brain and possibly repeat lumbar puncture, they can mychart [place] and we can order  Dr. Michelene Gardener, MD [phone] [phone] Google reviews Dermatologist in [place], [place] Address: [address] [place], [place] [address] Hours:  Open ? Closes 5?PM Phone: [phone]

## 2021-09-09 NOTE — Progress Notes (Signed)
PATIENT: Carl Knox DOB: Sep 02, 1965  REASON FOR VISIT: Follow up for pseudotumor cerebri  HISTORY FROM: Patient PRIMARY NEUROLOGIST: Dr. Jaynee Eagles   HISTORY OF PRESENT ILLNESS:  Today 09/09/2021: Follow up for IDIOPATHIC INTRACRANIAL HYPERTENSION He is having ringing in his ears. He has to have a periodic hearing test. He had a hearing test at work and his hearing decreased. Ringing in the ear and thumping in the ear. He has water in the ear with primary care but have an appointment an appointment with ENT tomorrow. He went to an allergist and is inflamed and seeing ENT tomorrow. If ENT can't address the ringing, and again it may just be hearing loss, would repeat MRi of the brain thin cuts through tthe IAC. And repeat LP. Seeing eye dotor in 6 months.   Patient complains of symptoms per HPI as well as the following symptoms: tinnitus . Pertinent negatives and positives per HPI. All others negative    Today 03/10/2021 Carl Knox here today for follow-up with history of pseudotumor cerebri with papilledema.  Remains on Trokendi 200 mg PM.  Has lumbar spine issues, MRI has shown potential for right L5 nerve root impingement. Managed through Dr. Tonita Cong and Dr. Nelva Bush does injections with good benefit, last injection was September 05 2020, none needed since then. Denies any recent syncopal episodes. Left foot injury previously, had left 5th toe nail removed, podiatrist found right toe hammer toe, surgery Jan 2023, has done well. 8-10 lb weight loss in last year, total 35 lbs last 2 years. No syncopal spells since last saw Dr. Jannifer Knox. 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 In past tried to reduce the dose, stopped it for 1 year, then started having more syncopal spells, that has been indicator of high ICP. Creatinine was 1.36, recheck in 2 months. Here today with his wife, has 2 grown daughters.   HISTORY  09/04/2020 Dr. Jannifer Knox: Carl Knox is a 56 year old left-handed black male with a history  of pseudotumor with papilledema.  He has been followed through his ophthalmologist, his most recent eye examination revealed resolution of the papilledema.  He has been on Trokendi taking 200 mg in the evening.  He had a lumbar puncture done in February 2022, opening pressure was 28 cm of water.  The patient unfortunately began having some right leg pain and back pain following the lumbar puncture.  MRI of the lumbar spine showed evidence of a disc bulge with a potential for right L5 nerve root impingement.  He has been followed through Dr. Tonita Cong, and he has had injections through Dr. Nelva Bush.  Injections do help some, but the benefit lasts only about a month or so.  He notes that it is painful for him to sit, but he feels better when he is up and walking and feels better when he is lying down.  He is sleeping fairly well at night.  He is on gabapentin 300 mg twice daily.  He returns for an evaluation.  REVIEW OF SYSTEMS: Out of a complete 14 system review of symptoms, the patient complains only of the following symptoms, and all other reviewed systems are negative.  See HPI  ALLERGIES: Allergies  Allergen Reactions   Valacyclovir Hcl Diarrhea    HOME MEDICATIONS: Outpatient Medications Prior to Visit  Medication Sig Dispense Refill   folic acid (FOLVITE) 1 MG tablet Take 1 tablet (1 mg total) by mouth daily. 90 tablet 9   levothyroxine (SYNTHROID) 75 MCG tablet  Take 1 tablet (75 mcg total) by mouth in the morning on an empty stomach 90 tablet 4   loratadine (CLARITIN) 10 MG tablet Take 10 mg by mouth daily.     Multiple Vitamin (MULTIVITAMIN) tablet Take 1 tablet by mouth daily.     mupirocin ointment (BACTROBAN) 2 % Apply 1 application topically 2 (two) times daily as needed for 7 to 10 days. 22 g 1   Topiramate ER (TROKENDI XR) 100 MG CP24 Take 2 capsules by mouth at bedtime 180 capsule 2   No facility-administered medications prior to visit.    PAST MEDICAL HISTORY: Past Medical History:   Diagnosis Date   Hypothyroidism    Morbid obesity ([place])    OSA on CPAP    Other specified disorders of thyroid    Papilledema    Pseudotumor cerebri    Sickle cell trait ([place])    Syncope and collapse 02/06/2016    PAST SURGICAL HISTORY: Past Surgical History:  Procedure Laterality Date   CYST EXCISION  2001   Thyglossal duct   FOOT SURGERY Right 02/09/2021   TONSILLECTOMY  1980    FAMILY HISTORY: Family History  Problem Relation Age of Onset   Hypertension Mother    Heart disease Mother    Kidney disease Mother    Hyperlipidemia Mother    Sleep apnea Mother    Obesity Mother    Diabetes Father    Hypertension Father    Hyperlipidemia Father    Heart disease Father    Kidney disease Father    Obesity Father     SOCIAL HISTORY: Social History   Socioeconomic History   Marital status: Married    Spouse name: Darlene   Number of children: 2   Years of education: Some grad school   Highest education level: Not on file  Occupational History   Occupation: Heavy Glass blower/designer    Comment: full time  Tobacco Use   Smoking status: Never    Passive exposure: Never   Smokeless tobacco: Never  Vaping Use   Vaping Use: Never used  Substance and Sexual Activity   Alcohol use: Yes    Alcohol/week: 0.0 standard drinks of alcohol    Comment: Occasional   Drug use: No   Sexual activity: Not on file    Comment: Married  Other Topics Concern   Not on file  Social History Narrative   Lives at home w/ his wife   Left-handed   Caffeine: 2 cups daily   Social Determinants of Health   Financial Resource Strain: Not on file  Food Insecurity: Not on file  Transportation Needs: Not on file  Physical Activity: Not on file  Stress: Not on file  Social Connections: Not on file  Intimate Partner Violence: Not on file   PHYSICAL EXAM  Vitals:   09/09/21 1456  BP: 118/70  Pulse: (!) 59  Weight: 290 lb (131.5 kg)  Height: '5\' 11"'$  (1.803 m)   Body mass index is  40.45 kg/m. Exam: NAD, pleasant                  Speech:    Speech is normal; fluent and spontaneous with normal comprehension.  Cognition:    The patient is oriented to person, place, and time;     recent and remote memory intact;     language fluent;    Cranial Nerves:    The pupils are equal, round, and reactive to light.Trigeminal sensation is intact and the  muscles of mastication are normal. The face is symmetric. The palate elevates in the midline. Hearing intact. Voice is normal. Shoulder shrug is normal. The tongue has normal motion without fasciculations.   Coordination:  No dysmetria  Motor Observation:    No asymmetry, no atrophy, and no involuntary movements noted. Tone:    Normal muscle tone.     Strength:    Strength is V/V in the upper and lower limbs.      Sensation: intact to LT    DIAGNOSTIC DATA (LABS, IMAGING, TESTING) - I reviewed patient records, labs, notes, testing and imaging myself where available.  Lab Results  Component Value Date   WBC 4.5 04/15/2020   HGB 14.2 04/15/2020   HCT 41.8 04/15/2020   MCV 77.6 (L) 04/15/2020   PLT 222 04/15/2020      Component Value Date/Time   NA 141 08/19/2020 0743   K 5.0 08/19/2020 0743   CL 109 (H) 08/19/2020 0743   CO2 21 08/19/2020 0743   GLUCOSE 85 08/19/2020 0743   BUN 26 (H) 08/19/2020 0743   CREATININE 1.34 (H) 08/19/2020 0743   CALCIUM 9.4 08/19/2020 0743   PROT 7.0 08/19/2020 0743   ALBUMIN 4.3 08/19/2020 0743   AST 26 08/19/2020 0743   ALT 19 08/19/2020 0743   ALKPHOS 97 08/19/2020 0743   BILITOT 0.4 08/19/2020 0743   GFRNONAA 64 12/24/2019 0911   GFRAA 74 12/24/2019 0911   Lab Results  Component Value Date   CHOL 110 08/19/2020   HDL 39 (L) 08/19/2020   LDLCALC 54 08/19/2020   TRIG 85 08/19/2020   Lab Results  Component Value Date   HGBA1C 5.1 08/19/2020   Lab Results  Component Value Date   VITAMINB12 1,067 04/26/2019   Lab Results  Component Value Date   TSH 2.250  08/19/2020    ASSESSMENT AND PLAN 56 y.o. year old male   42.  Pseudotumor cerebri 2.  Low back pain - resolved  -Mr. Dieudonne is doing very well currently, is a delightful patient -Got a note from ophthalmology, eye exam last week showed no papilledema on Trokendi XR 200 mg at bedtime but OCT in left eye possibly worsened will continue to follow -Will continue Trokendi at current dosing, in the past has tried to wean off and had return of symptoms (presented as syncopal episodes) -Encouraged to continue weight loss, over the last 2 years, has lost about 35 pounds -Back pain is under good control, will continue to see orthopedics as needed -Follow-up in 6 months or sooner if needed, will be followed by Dr. Jaynee Eagles since Dr. Jannifer Knox has retired - Tinnitus and ear pressure: He has water in the ear with primary care but have an appointment an appointment with ENT tomorrow. He went to an allergist and is inflamed and seeing ENT tomorrow. If ENT can't address the ringing, and again it may just be hearing loss, would repeat MRi of the brain thin cuts through tthe IAC. And repeat LP. Seeing eye dotor in 6 months, mychart me with results and may need to be seen sooner or have orders as above  Sarina Ill, MD [facility] [address], [place], [place] [address] [phone]  I spent over 30 minutes of face-to-face and non-face-to-face time with patient on the  1. IIH (idiopathic intracranial hypertension)   2. Tinnitus of right ear   3. Pressure sensation in right ear    diagnosis.  This included previsit chart review, lab  review, study review, order entry, electronic health record documentation, patient education on the different diagnostic and therapeutic options, counseling and coordination of care, risks and benefits of management, compliance, or risk factor reduction

## 2021-09-10 DIAGNOSIS — H9313 Tinnitus, bilateral: Secondary | ICD-10-CM | POA: Diagnosis not present

## 2021-09-10 DIAGNOSIS — H903 Sensorineural hearing loss, bilateral: Secondary | ICD-10-CM | POA: Diagnosis not present

## 2021-09-11 ENCOUNTER — Other Ambulatory Visit (HOSPITAL_COMMUNITY): Payer: Self-pay

## 2021-09-15 ENCOUNTER — Telehealth: Payer: Self-pay | Admitting: Neurology

## 2021-09-15 DIAGNOSIS — K148 Other diseases of tongue: Secondary | ICD-10-CM | POA: Diagnosis not present

## 2021-09-15 NOTE — Telephone Encounter (Signed)
Pt's wife called wanting to inform provider that the pt has seen the ENT and she is wanting to know if provider can go ahead and order that MRI. Please call back when available.

## 2021-09-16 NOTE — Telephone Encounter (Signed)
Patient saw Dr Reynolds Bowl yesterday. Note in Epic.   Per Dr Cathren Laine last visit:  "Tinnitus and ear pressure: He has water in the ear with primary care but have an appointment an appointment with ENT tomorrow. He went to an allergist and is inflamed and seeing ENT tomorrow. If ENT can't address the ringing, and again it may just be hearing loss, would repeat MRi of the brain thin cuts through tthe IAC. And repeat LP. Seeing eye dotor in 6 months, mychart me with results and may need to be seen sooner or have orders as above"

## 2021-09-22 ENCOUNTER — Telehealth: Payer: Self-pay | Admitting: *Deleted

## 2021-09-22 NOTE — Telephone Encounter (Signed)
Pt wife called, need update on fmla form. Please call [phone] Thanks

## 2021-09-23 NOTE — Telephone Encounter (Signed)
Pt wife pick up form on 09/23/21

## 2021-09-23 NOTE — Telephone Encounter (Signed)
Received completed and signed.  To Hilda Blades in Medical Records.

## 2021-09-27 ENCOUNTER — Other Ambulatory Visit: Payer: Self-pay | Admitting: Neurology

## 2021-09-27 DIAGNOSIS — H9190 Unspecified hearing loss, unspecified ear: Secondary | ICD-10-CM

## 2021-09-27 DIAGNOSIS — G932 Benign intracranial hypertension: Secondary | ICD-10-CM

## 2021-09-28 ENCOUNTER — Telehealth: Payer: Self-pay | Admitting: Neurology

## 2021-09-28 NOTE — Telephone Encounter (Signed)
Spoke with patient's wife and updated her that Dr Jaynee Eagles has ordered an LP and MRI brain. Pt's wife verbalized understanding. She said pt will not be thrilled about LP. She will be on the lookout for a call from GI. She has their number to call if needed. Aware they will probably do MRI brain first so they have imaging on file before LP. She was very appreciative of the call.

## 2021-09-28 NOTE — Telephone Encounter (Signed)
UMR NPR sent to GI

## 2021-10-02 ENCOUNTER — Ambulatory Visit
Admission: RE | Admit: 2021-10-02 | Discharge: 2021-10-02 | Disposition: A | Discharge status: Home / Self Care | Payer: 59 | Source: Ambulatory Visit | Attending: Neurology | Admitting: Neurology

## 2021-10-02 DIAGNOSIS — H9190 Unspecified hearing loss, unspecified ear: Secondary | ICD-10-CM

## 2021-10-02 DIAGNOSIS — G932 Benign intracranial hypertension: Secondary | ICD-10-CM

## 2021-10-02 MED ORDER — GADOBENATE DIMEGLUMINE 529 MG/ML IV SOLN
20.0000 mL | Freq: Once | INTRAVENOUS | Status: AC | PRN
  Administered 2021-10-02: 20 mL via INTRAVENOUS

## 2021-10-16 ENCOUNTER — Telehealth: Payer: Self-pay | Admitting: Neurology

## 2021-10-16 ENCOUNTER — Ambulatory Visit
Admission: RE | Admit: 2021-10-16 | Discharge: 2021-10-16 | Disposition: A | Discharge status: Home / Self Care | Payer: 59 | Source: Ambulatory Visit | Attending: Neurology | Admitting: Neurology

## 2021-10-16 VITALS — BP 116/68 | HR 52

## 2021-10-16 DIAGNOSIS — G932 Benign intracranial hypertension: Secondary | ICD-10-CM | POA: Diagnosis not present

## 2021-10-16 DIAGNOSIS — H9190 Unspecified hearing loss, unspecified ear: Secondary | ICD-10-CM

## 2021-10-16 LAB — CSF CELL COUNT WITH DIFFERENTIAL
RBC Count, CSF: 2 cells/uL — ABNORMAL HIGH
TOTAL NUCLEATED CELL: 3 cells/uL (ref 0–5)

## 2021-10-16 LAB — GLUCOSE, CSF: Glucose, CSF: 53 mg/dL (ref 40–80)

## 2021-10-16 LAB — PROTEIN, CSF: Total Protein, CSF: 60 mg/dL — ABNORMAL HIGH (ref 15–45)

## 2021-10-16 NOTE — Telephone Encounter (Signed)
Pt wife is calling. Stated Pt went and got LP today at [facility] and his opening pressure was 32 and they dropped him to 15. Requesting a call back from nurse.

## 2021-10-16 NOTE — Discharge Instructions (Signed)

## 2021-10-18 ENCOUNTER — Telehealth: Payer: Self-pay | Admitting: Neurology

## 2021-10-18 NOTE — Telephone Encounter (Signed)
Opening pressure was elevated at 32. I'd like to know if his hearing improved after LP. If so I would consider increasing his Topiramate, please ask patient if he experienced relief from his LP and, if so, if he would be willing to increase his medication and I can send in another prescription thanks

## 2021-10-19 ENCOUNTER — Other Ambulatory Visit (HOSPITAL_COMMUNITY): Payer: Self-pay

## 2021-10-19 DIAGNOSIS — Z9889 Other specified postprocedural states: Secondary | ICD-10-CM | POA: Diagnosis not present

## 2021-10-19 DIAGNOSIS — E236 Other disorders of pituitary gland: Secondary | ICD-10-CM | POA: Diagnosis not present

## 2021-10-19 DIAGNOSIS — E039 Hypothyroidism, unspecified: Secondary | ICD-10-CM | POA: Diagnosis not present

## 2021-10-19 DIAGNOSIS — E669 Obesity, unspecified: Secondary | ICD-10-CM | POA: Diagnosis not present

## 2021-10-19 NOTE — Telephone Encounter (Signed)
I called wife of pt.  Pt is doing well.  Tolerated the LP, good results of no tinnitis, or muffled hearing or throbbing.  Has lost 10 #.  Taking trokendi '200mg'$  po qhs.  Ok to increase.  Has f/u appt 03-17-2022.  Asked about monitor weight here? See sooner then 03-17-2022.  Increase trokeni ER?

## 2021-10-19 NOTE — Telephone Encounter (Signed)
Will address in other encounter with Dr Cathren Laine comments.

## 2021-10-19 NOTE — Telephone Encounter (Signed)
Pt's wife would like to speak with a nurse about increase in the Topiramate and have lost 10lbs, to discuss if he needs to come in.  Best number to reach [phone]

## 2021-10-20 ENCOUNTER — Other Ambulatory Visit (HOSPITAL_COMMUNITY): Payer: Self-pay

## 2021-10-20 MED ORDER — TOPIRAMATE ER 50 MG PO CAP24
1.0000 | ORAL_CAPSULE | Freq: Every day | ORAL | 1 refills | Status: DC
  Filled 2021-10-20: qty 90, 90d supply, fill #0

## 2021-10-20 NOTE — Telephone Encounter (Signed)
Per Dr. Cathren Laine note " If they are amenadle I would increase Trokendi by '50mg'$  (add a '50mg'$  Trokendi to his already '200mg'$  capsule). We can still see him 2/28, if adding the '50mg'$  doesn't help they can contact us in 4-6 weeks and we can increase that again. Let me know and please order an additional '50mg'$  once daily or let me know and I can order Trokendi".  Pt was fine with that.  Will escribe order to [place].

## 2021-10-20 NOTE — Addendum Note (Signed)
Addended by: Brandon Melnick on: 10/20/2021 02:56 PM   Modules accepted: Orders

## 2021-10-22 DIAGNOSIS — G932 Benign intracranial hypertension: Secondary | ICD-10-CM | POA: Diagnosis not present

## 2021-11-17 DIAGNOSIS — H903 Sensorineural hearing loss, bilateral: Secondary | ICD-10-CM | POA: Diagnosis not present

## 2021-11-17 DIAGNOSIS — H9313 Tinnitus, bilateral: Secondary | ICD-10-CM | POA: Diagnosis not present

## 2021-11-24 ENCOUNTER — Other Ambulatory Visit (HOSPITAL_COMMUNITY): Payer: Self-pay

## 2021-11-24 ENCOUNTER — Telehealth: Payer: Self-pay | Admitting: Neurology

## 2021-11-24 MED ORDER — TOPIRAMATE ER 50 MG PO CAP24
2.0000 | ORAL_CAPSULE | Freq: Every day | ORAL | 1 refills | Status: DC
  Filled 2021-11-24: qty 180, fill #0
  Filled 2021-12-14: qty 180, 90d supply, fill #0

## 2021-11-24 NOTE — Telephone Encounter (Signed)
Spoke to wife and they will increase trokeni [employer] '300mg'$  daily.  (Will use the '200mg'$  cap along with 2 -'50mg'$  caps to make the total '300mg'$ ).  She verbalized understanding.

## 2021-11-24 NOTE — Telephone Encounter (Signed)
Pt wife is calling. Stated pt is having really bad headaches and ringing in the ear. Stated that pt medication might needed to be increase. She is requesting a call back from nurse.

## 2021-11-24 NOTE — Telephone Encounter (Signed)
I called pts wife.  Pt has noted last week or so headaches off and on.  Last pm had bad headache top of head/center and back of head, took 2 ES tylenol and this helped some, some soreness last night when went to bed and this morning at 0400 when off to work.  Taking trokendi '250mg'$  at 0400.  Did not mention any vision issues.  Your last phone note (pt message) states that in 4-6 wks may increase trokendi as per below.    10/19/21  5:22 PM "If they are amenadle I would increase Trokendi by '50mg'$  (add a '50mg'$  Trokendi to his already '200mg'$  capsule). We can still see him 2/28, if adding the '50mg'$  doesn't help they can contact us in 4-6 weeks and we can increase that again. Let me know and please order an additional '50mg'$  once daily or let me know and I can order Trokendi "  Please advise and will call wife back.

## 2021-11-24 NOTE — Telephone Encounter (Signed)
We can increase the trokendi to '300mg'$  if they are amenable please prescribe in the meantime her can take 2x 50 and add it to the 200 until he gets his presciption thanks

## 2021-11-24 NOTE — Addendum Note (Signed)
Addended by: Brandon Melnick on: 11/24/2021 03:46 PM   Modules accepted: Orders

## 2021-12-04 ENCOUNTER — Other Ambulatory Visit (HOSPITAL_COMMUNITY): Payer: Self-pay

## 2021-12-04 MED ORDER — LEVOTHYROXINE SODIUM 75 MCG PO TABS
75.0000 ug | ORAL_TABLET | Freq: Every morning | ORAL | 3 refills | Status: DC
  Filled 2021-12-04: qty 90, 90d supply, fill #0
  Filled 2022-03-08: qty 30, 30d supply, fill #1
  Filled 2022-04-04: qty 30, 30d supply, fill #2
  Filled 2022-05-06: qty 30, 30d supply, fill #3
  Filled 2022-06-02: qty 30, 30d supply, fill #4
  Filled 2022-07-06: qty 30, 30d supply, fill #5
  Filled 2022-08-07: qty 30, 30d supply, fill #6
  Filled 2022-09-05: qty 30, 30d supply, fill #7
  Filled 2022-10-01: qty 30, 30d supply, fill #8
  Filled 2022-11-03: qty 30, 30d supply, fill #9

## 2021-12-11 ENCOUNTER — Other Ambulatory Visit (HOSPITAL_COMMUNITY): Payer: Self-pay

## 2021-12-11 DIAGNOSIS — J189 Pneumonia, unspecified organism: Secondary | ICD-10-CM | POA: Diagnosis not present

## 2021-12-11 MED ORDER — PREDNISONE 20 MG PO TABS
20.0000 mg | ORAL_TABLET | Freq: Two times a day (BID) | ORAL | 0 refills | Status: DC
  Filled 2021-12-11: qty 10, 5d supply, fill #0

## 2021-12-11 MED ORDER — DOXYCYCLINE HYCLATE 100 MG PO TABS
100.0000 mg | ORAL_TABLET | Freq: Two times a day (BID) | ORAL | 0 refills | Status: DC
  Filled 2021-12-11: qty 20, 10d supply, fill #0

## 2021-12-14 ENCOUNTER — Other Ambulatory Visit: Payer: Self-pay | Admitting: Neurology

## 2021-12-14 ENCOUNTER — Telehealth: Payer: Self-pay | Admitting: Neurology

## 2021-12-14 ENCOUNTER — Other Ambulatory Visit (HOSPITAL_COMMUNITY): Payer: Self-pay

## 2021-12-14 NOTE — Telephone Encounter (Signed)
Pt wife is calling. Stated pt needs a P.A on medication  Topiramate ER (TROKENDI XR) 100 MG CP24. Stated it need to be '300mg'$ . Pt wife said pt is having increase headaches.

## 2021-12-15 ENCOUNTER — Other Ambulatory Visit (HOSPITAL_COMMUNITY): Payer: Self-pay

## 2021-12-15 NOTE — Telephone Encounter (Signed)
Trokendi XR 100 mg PA completed on Cover My Meds. Key: [id number]. Request quantity of 90 per 30 days (dose 300 mg). Awaiting determination from [facility].

## 2021-12-16 ENCOUNTER — Other Ambulatory Visit (HOSPITAL_COMMUNITY): Payer: Self-pay

## 2021-12-16 MED ORDER — TOPIRAMATE ER 100 MG PO CAP24
100.0000 mg | ORAL_CAPSULE | Freq: Every day | ORAL | 2 refills | Status: DC
  Filled 2021-12-16 – 2022-03-17 (×2): qty 90, 90d supply, fill #0

## 2021-12-16 MED ORDER — TOPIRAMATE ER 200 MG PO CAP24
200.0000 mg | ORAL_CAPSULE | Freq: Every day | ORAL | 2 refills | Status: DC
  Filled 2021-12-16: qty 90, 90d supply, fill #0
  Filled 2022-03-17: qty 90, 90d supply, fill #1

## 2021-12-16 NOTE — Telephone Encounter (Signed)
Deniedon November 28 This request has not been approved. Based on the information submitted for review, you did not meet our guideline rules for the requested drug. In order for your request to be approved, your provider would need to show that you have met the guideline rules below. The details below are written in medical language. If you have questions, please contact your provider. In some cases, the requested medication or alternatives offered may have additional approval requirements. The guideline named GENERAL QUANTITY EXCEPTION CRITERIA (reviewed for Trokendi XR 168m tablets) requires the following criteria (rules) be met for approval: 1. The requested medication is being used to treat certain medical conditions that are Food and Drug Administration (FDA) approved OR the conditions are supported by medical literature such as Drugdex, AHFS Drug Information (only), NMilam(NCCN) or Clinical Pharmacology. 2. Your provider has submitted two (2) articles from major peer reviewed medical journals that support the safety and efficacy of the requested drug at the intended dosage for the specified indication. 3. You have tried the highest strength available on formulary to achieve the same total daily dose (e.g. dose consolidation: you take one-162mtablet vs. two-55m82mablets) OR your doctor has provided clinical reasons why you cannot dose consolidate. Your prescriber requested this drug for the treatment of benign intracranial hypertension (condition that occurs when pressure inside the skull increases for no obvious reason). This request was denied because your doctor did not tell us [place] you have met these requirements. Please work with your doctor to use a different medication or get us [place] information if it will allow us [place] approve this request. A written notification letter will follow with additional details.

## 2021-12-16 NOTE — Addendum Note (Signed)
Addended by: Gildardo Griffes on: 12/16/2021 12:15 PM   Modules accepted: Orders

## 2021-12-16 NOTE — Telephone Encounter (Signed)
Yes - please do - thanks

## 2021-12-16 NOTE — Telephone Encounter (Signed)
Per v.o. Dr Jaynee Eagles, changed Trokendi XR 100 mg prescription to quantity of #90 and updated instructions to take 1 at bedtime along with 200 mg for total of 300 mg at bedtime. Also prescribed 100 mg capsule with appropriate instructions to reflect total dose of 300 mg at bedtime. Called pharmacy and confirmed both went through insurance for $15 and they will fill them for the pt. I called the pt's wife and let her know of this change. She also spoke with the pharmacy. She plans to use the 50 mg capsules for now (in addition to 200 mg) since he just picked up a 90 day supply of the 50 mg and will place 100 mg on hold. She states patient has bil PNA and is on prednisone. He has two days left of the treatment. She stated in the past there was concern that prednisone could increase his ICP so she would like to know if Dr Jaynee Eagles feels he needs to be seen by both ophthalmology and Dr Jaynee Eagles after he finishes the prednisone and before his February appt. He hasn't reported any issue with vision but he does have headaches. She noted that after he increased to 300 mg he got better but then the headaches came back when he was diagnosed with PNA.

## 2021-12-16 NOTE — Telephone Encounter (Signed)
Looks like we may need to prescribe a 200 mg and 100 mg capsule to total 300 mg instead of the higher quantity of lower dose capsule.

## 2022-02-16 ENCOUNTER — Ambulatory Visit: Payer: 59 | Admitting: Allergy & Immunology

## 2022-02-18 ENCOUNTER — Ambulatory Visit: Payer: 59 | Admitting: Allergy & Immunology

## 2022-02-23 ENCOUNTER — Other Ambulatory Visit: Payer: Self-pay

## 2022-02-23 ENCOUNTER — Encounter: Payer: Self-pay | Admitting: Allergy & Immunology

## 2022-02-23 ENCOUNTER — Ambulatory Visit (INDEPENDENT_AMBULATORY_CARE_PROVIDER_SITE_OTHER): Payer: Commercial Managed Care - PPO | Admitting: Allergy & Immunology

## 2022-02-23 VITALS — BP 126/78 | HR 58 | Temp 98.5°F | Resp 18 | Wt 296.5 lb

## 2022-02-23 DIAGNOSIS — J301 Allergic rhinitis due to pollen: Secondary | ICD-10-CM | POA: Diagnosis not present

## 2022-02-23 DIAGNOSIS — T783XXD Angioneurotic edema, subsequent encounter: Secondary | ICD-10-CM | POA: Diagnosis not present

## 2022-02-23 DIAGNOSIS — K148 Other diseases of tongue: Secondary | ICD-10-CM | POA: Diagnosis not present

## 2022-02-23 NOTE — Patient Instructions (Addendum)
1. Tongue lesion - This seems resolved.   2. Rhinorrhea - I bet that this is just nasal saline rinses and water that gets stuck I na sinus cavity.  3. Return if symptoms worsen or fail to improve.    Please inform us of any Emergency Department visits, hospitalizations, or changes in symptoms. Call us before going to the ED for breathing or allergy symptoms since we might be able to fit you in for a sick visit. Feel free to contact us anytime with any questions, problems, or concerns.  It was a pleasure to see you again today! Say hello to [place] for me!   Websites that have reliable patient information: 1. American Academy of Asthma, Allergy, and Immunology: www.aaaai.org 2. Food Allergy Research and Education (FARE): foodallergy.org 3. Mothers of Asthmatics: http://www.asthmacommunitynetwork.org 4. American College of Allergy, Asthma, and Immunology: www.acaai.org   COVID-19 Vaccine Information can be found at: ShippingScam.co.uk For questions related to vaccine distribution or appointments, please email [email or web address] or call [phone].   We realize that you might be concerned about having an allergic reaction to the COVID19 vaccines. To help with that concern, WE ARE OFFERING THE COVID19 VACCINES IN OUR OFFICE! Ask the front desk for dates!     "Like" [place] on Facebook and Instagram for our latest updates!      A healthy democracy works best when [employer] participate! Make sure you are registered to vote! If you have moved or changed any of your contact information, you will need to get this updated before voting!  In some cases, you MAY be able to register to vote online: [email or web address]

## 2022-02-23 NOTE — Progress Notes (Unsigned)
FOLLOW UP  Date of Service/Encounter:  02/23/22   Assessment:   Tongue lesion - seems more consistent with a geographic tongue, but ENT appointment pending   Perennial and seasonal allergic rhinitis (grasses, trees, ragweed)   Negative workup for HAE and alpha gal  Plan/Recommendations:   1. Tongue lesion - This seems resolved.   2. Rhinorrhea - I bet that this is just nasal saline rinses and water that gets stuck I na sinus cavity.  3. Return if symptoms worsen or fail to improve.   Subjective:   Scottie Metayer is a 57 y.o. 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[place]) 03/10/2021   Prostate cancer ([place]) 03/10/2021   Sciatica 03/10/2021   Screening for intestinal cancer 03/10/2021   Lumbar radiculopathy 06/09/2020   Lumbar pain 02/25/2020   Inflammatory pseudotumor of orbit 02/25/2020   Sickle cell trait ([place]) 02/25/2020   Vitamin D deficiency 09/25/2019   Class 3 severe obesity with serious comorbidity and body mass index (BMI) of 40.0 to 44.9 in adult ([place]) 09/25/2019   Papilledema 07/11/2018   Obstructive sleep apnea on CPAP 10/12/2016   Pseudotumor cerebri 07/09/2016   History of obstructive sleep apnea 07/09/2016   Syncope and collapse 02/06/2016    History obtained from: chart review and patient.  Arnet is a 57 y.o. male presenting for a follow up visit.  He was last seen in July 2023.  At that time, we continue Claritin 10 mg  daily.  This was for his tongue lesion.  We had sent him to ENT for further workup.  Since last visit, he has done well. His tongue cleared up with the nasal saline rinses. This has proved to be very helpful for his symptoms. It was just flling up his nostrils, but it is now draining. This only happens when he leans over a certain way when he is fixing particular equipment and has to lean over in a particular way.   Hwoever, he still gets a weird sesnsation when he is at work. When he goes upside down, he reports that his nose will run. This is clear and this is the only weird thing that is going on with him now.   Otherwise, there have been no changes to his past medical history, surgical history, family history, or social history.    Review of Systems  Constitutional: Negative.  Negative for fever, malaise/fatigue and weight loss.  HENT: Negative.  Negative for congestion, ear discharge and ear pain.   Eyes:  Negative for pain, discharge and redness.  Respiratory:  Negative for cough, sputum production, shortness of breath and wheezing.   Cardiovascular: Negative.  Negative for chest pain and palpitations.  Gastrointestinal:  Negative for abdominal pain, heartburn, nausea and vomiting.  Skin: Negative.  Negative for itching and rash.  Neurological:  Negative for dizziness and headaches.  Endo/Heme/Allergies:  Negative for environmental allergies. Does not bruise/bleed easily.       Objective:   Blood pressure 126/78,  pulse (!) 58, temperature 98.5 F (36.9 C), temperature source Temporal, resp. rate 18, weight 296 lb 8 oz (134.5 kg), SpO2 97 %. Body mass index is 41.35 kg/m.    Physical Exam Vitals reviewed.  Constitutional:      Appearance: He is well-developed.  HENT:     Head: Normocephalic and atraumatic.     Comments: Tongue appears normal today with some delineations consistent with geographic tongue. There is no obvious swelling at all today.     Right Ear: Tympanic  membrane, ear canal and external ear normal. No drainage, swelling or tenderness. Tympanic membrane is not injected, scarred, erythematous, retracted or bulging.     Left Ear: Tympanic membrane, ear canal and external ear normal. No drainage, swelling or tenderness. Tympanic membrane is not injected, scarred, erythematous, retracted or bulging.     Nose: No nasal deformity, septal deviation, mucosal edema or rhinorrhea.     Right Turbinates: Not enlarged or swollen.     Left Turbinates: Not enlarged or swollen.     Right Sinus: No maxillary sinus tenderness or frontal sinus tenderness.     Left Sinus: No maxillary sinus tenderness or frontal sinus tenderness.     Mouth/Throat:     Mouth: Mucous membranes are not pale and not dry.     Pharynx: Uvula midline.     Comments: Tongue appears normal.  Eyes:     General:        Right eye: No discharge.        Left eye: No discharge.     Conjunctiva/sclera: Conjunctivae normal.     Right eye: Right conjunctiva is not injected. No chemosis.    Left eye: Left conjunctiva is not injected. No chemosis.    Pupils: Pupils are equal, round, and reactive to light.  Cardiovascular:     Rate and Rhythm: Normal rate and regular rhythm.     Heart sounds: Normal heart sounds.  Pulmonary:     Effort: Pulmonary effort is normal. No tachypnea, accessory muscle usage or respiratory distress.     Breath sounds: Normal breath sounds. No wheezing, rhonchi or rales.  Chest:     Chest wall: No tenderness.  Abdominal:     Tenderness: There is no abdominal tenderness.  Lymphadenopathy:     Head:     Right side of head: No submandibular, tonsillar or occipital adenopathy.     Left side of head: No submandibular, tonsillar or occipital adenopathy.     Cervical: No cervical adenopathy.  Skin:    Coloration: Skin is not pale.     Findings: No abrasion, erythema, petechiae or rash. Rash is not papular, urticarial or vesicular.  Neurological:     Mental Status: He is  alert.  Psychiatric:        Behavior: Behavior is cooperative.      Diagnostic studies: none     Salvatore Marvel, MD  [facility]

## 2022-02-24 ENCOUNTER — Encounter: Payer: Self-pay | Admitting: Allergy & Immunology

## 2022-03-03 DIAGNOSIS — G932 Benign intracranial hypertension: Secondary | ICD-10-CM | POA: Diagnosis not present

## 2022-03-03 DIAGNOSIS — Z0000 Encounter for general adult medical examination without abnormal findings: Secondary | ICD-10-CM | POA: Diagnosis not present

## 2022-03-03 DIAGNOSIS — J301 Allergic rhinitis due to pollen: Secondary | ICD-10-CM | POA: Diagnosis not present

## 2022-03-03 DIAGNOSIS — E039 Hypothyroidism, unspecified: Secondary | ICD-10-CM | POA: Diagnosis not present

## 2022-03-03 DIAGNOSIS — Z1211 Encounter for screening for malignant neoplasm of colon: Secondary | ICD-10-CM | POA: Diagnosis not present

## 2022-03-03 DIAGNOSIS — Z125 Encounter for screening for malignant neoplasm of prostate: Secondary | ICD-10-CM | POA: Diagnosis not present

## 2022-03-03 DIAGNOSIS — Z6841 Body Mass Index (BMI) 40.0 and over, adult: Secondary | ICD-10-CM | POA: Diagnosis not present

## 2022-03-03 DIAGNOSIS — H9312 Tinnitus, left ear: Secondary | ICD-10-CM | POA: Diagnosis not present

## 2022-03-03 DIAGNOSIS — E230 Hypopituitarism: Secondary | ICD-10-CM | POA: Diagnosis not present

## 2022-03-08 ENCOUNTER — Other Ambulatory Visit: Payer: Self-pay

## 2022-03-08 ENCOUNTER — Other Ambulatory Visit (HOSPITAL_COMMUNITY): Payer: Self-pay

## 2022-03-12 DIAGNOSIS — G932 Benign intracranial hypertension: Secondary | ICD-10-CM | POA: Diagnosis not present

## 2022-03-17 ENCOUNTER — Encounter: Payer: Self-pay | Admitting: Neurology

## 2022-03-17 ENCOUNTER — Ambulatory Visit: Payer: Commercial Managed Care - PPO | Admitting: Neurology

## 2022-03-17 ENCOUNTER — Other Ambulatory Visit (HOSPITAL_COMMUNITY): Payer: Self-pay

## 2022-03-17 VITALS — BP 125/72 | HR 63 | Ht 71.0 in | Wt 292.6 lb

## 2022-03-17 DIAGNOSIS — G932 Benign intracranial hypertension: Secondary | ICD-10-CM

## 2022-03-17 DIAGNOSIS — G43711 Chronic migraine without aura, intractable, with status migrainosus: Secondary | ICD-10-CM | POA: Diagnosis not present

## 2022-03-17 MED ORDER — TROKENDI XR 100 MG PO CP24
100.0000 mg | ORAL_CAPSULE | Freq: Every day | ORAL | 6 refills | Status: DC
  Filled 2022-03-17: qty 90, 90d supply, fill #0
  Filled 2022-06-15: qty 90, 90d supply, fill #1
  Filled 2022-09-10: qty 30, 30d supply, fill #2
  Filled 2022-09-10: qty 60, 60d supply, fill #2

## 2022-03-17 MED ORDER — TROKENDI XR 200 MG PO CP24
200.0000 mg | ORAL_CAPSULE | Freq: Every day | ORAL | 2 refills | Status: DC
  Filled 2022-03-17: qty 90, 90d supply, fill #0
  Filled 2022-06-15 – 2022-06-17 (×2): qty 90, 90d supply, fill #1
  Filled 2022-09-10: qty 90, 90d supply, fill #2

## 2022-03-17 NOTE — Patient Instructions (Signed)
WE FOUND ANOTHER RECORD IN OUR SYSTEM THAT MATCHED YOUR INFORMATION. A customer service agent will be happy to help you with your co-pay savings card. Please call [phone].  For questions about Trokendi XR, including medication access and coverage, please call [facility] [facility] at [phone], Monday through Friday, 8:30 AM to 5:30 PM ET, excluding holidays.

## 2022-03-17 NOTE — Progress Notes (Unsigned)
PATIENT: Carl Knox DOB: [birth year]  REASON FOR VISIT: Follow up for pseudotumor cerebri  HISTORY FROM: Patient PRIMARY NEUROLOGIST: Dr. Jaynee Eagles   HISTORY OF PRESENT ILLNESS:  03/17/2022: No papilledema, vision was great, following closely. Doing well on the Trokendi '300mg'$ . Recently had blood work at [facility] and doing well nothing concerning it was his physical, Tinitus went away immediately after LP, hasn't come back now that he is on 300. Sleep apnea was very bad, no snoring, lost a lot of weight. No morning headaches. Off of cpap, doesn't want to retest bc symptoms went away after losing weight. Cannot tolerate Topiramate IR cannot be tolerated at higher doses. We will ask for a PA as well. We called the trokendi hotline, found his copay card : ID [id number] grp [id number] bin [phone] pcn LOYALTY. Please also send [place] a PA he cannot tolerate Topiramate IR or ER generic. Dispense as written needs brand. We reordered with the copay card and also then called the pharmacy to run it. Also contacted the trokendi rep. Got him the trokendi until septamber when we will have to see if there is a new copay card (Please call [phone]. For questions about Trokendi XR, including medication access and coverage, please call [employer] [facility] at [phone], Monday through Friday, 8:30 AM to 5:30 PM ET, excluding holidays.)  Patient complains of symptoms per HPI as well as the following symptoms: none . Pertinent negatives and positives per HPI. All others negative   Today 09/09/2021: Follow up for IDIOPATHIC INTRACRANIAL HYPERTENSION He is having ringing in his ears. He has to have a periodic hearing test. He had a hearing test at work and his hearing decreased. Ringing in the ear and thumping in the ear. He has water in the ear with primary care but have an appointment an appointment with ENT tomorrow. He went to an allergist and is inflamed and seeing ENT tomorrow. If ENT can't address the ringing, and again  it may just be hearing loss, would repeat MRi of the brain thin cuts through tthe IAC. And repeat LP. Seeing eye dotor in 6 months.   Patient complains of symptoms per HPI as well as the following symptoms: tinnitus . Pertinent negatives and positives per HPI. All others negative    Today 03/10/2021 Carl Knox here today for follow-up with history of pseudotumor cerebri with papilledema.  Remains on Trokendi 200 mg PM.  Has lumbar spine issues, MRI has shown potential for right L5 nerve root impingement. Managed through Dr. Tonita Cong and Dr. Nelva Bush does injections with good benefit, last injection was September 05 2020, none needed since then. Denies any recent syncopal episodes. Left foot injury previously, had left 5th toe nail removed, podiatrist found right toe hammer toe, surgery Jan 2023, has done well. 8-10 lb weight loss in last year, total 35 lbs last 2 years. No syncopal spells since last saw Dr. Jannifer Franklin. 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 In past tried to reduce the dose, stopped it for 1 year, then started having more syncopal spells, that has been indicator of high ICP. Creatinine was 1.36, recheck in 2 months. Here today with his wife, has 2 grown daughters.   HISTORY  09/04/2020 Dr. Jannifer Franklin: Carl Knox is a 57 year old left-handed black male with a history of pseudotumor with papilledema.  He has been followed through his ophthalmologist, his most recent eye examination revealed resolution of the papilledema.  He has been on Trokendi taking 200 mg in the  evening.  He had a lumbar puncture done in February 2022, opening pressure was 28 cm of water.  The patient unfortunately began having some right leg pain and back pain following the lumbar puncture.  MRI of the lumbar spine showed evidence of a disc bulge with a potential for right L5 nerve root impingement.  He has been followed through Dr. Tonita Cong, and he has had injections through Dr. Nelva Bush.  Injections do help some, but the benefit  lasts only about a month or so.  He notes that it is painful for him to sit, but he feels better when he is up and walking and feels better when he is lying down.  He is sleeping fairly well at night.  He is on gabapentin 300 mg twice daily.  He returns for an evaluation.  REVIEW OF SYSTEMS: Out of a complete 14 system review of symptoms, the patient complains only of the following symptoms, and all other reviewed systems are negative.  See HPI  ALLERGIES: Allergies  Allergen Reactions   Valacyclovir Hcl Diarrhea    HOME MEDICATIONS: Outpatient Medications Prior to Visit  Medication Sig Dispense Refill   folic acid (FOLVITE) 1 MG tablet Take 1 tablet (1 mg total) by mouth daily. 90 tablet 9   levothyroxine (SYNTHROID) 75 MCG tablet Take 1 tablet (75 mcg total) by mouth in the morning on an empty stomach 90 tablet 3   loratadine (CLARITIN) 10 MG tablet Take 10 mg by mouth daily.     Multiple Vitamin (MULTIVITAMIN) tablet Take 1 tablet by mouth daily.     mupirocin ointment (BACTROBAN) 2 % Apply 1 application topically 2 (two) times daily as needed for 7 to 10 days. 22 g 1   Topiramate ER (TROKENDI XR) 100 MG CP24 Take 1 capsule (100 mg) by mouth at bedtime. Take with 200 mg capsule for total of 300 mg at bedtime. 90 capsule 2   Topiramate ER (TROKENDI XR) 200 MG CP24 Take 1 capsule (200 mg) by mouth at bedtime. Take with Trokendi XR 100 mg for total of 300 mg daily at bedtime. 90 capsule 2   No facility-administered medications prior to visit.    PAST MEDICAL HISTORY: Past Medical History:  Diagnosis Date   Hypothyroidism    Morbid obesity ([place])    OSA on CPAP    Other specified disorders of thyroid    Papilledema    Pneumonia    11/2021   Pseudotumor cerebri    Sickle cell trait ([place])    Syncope and collapse 02/06/2016    PAST SURGICAL HISTORY: Past Surgical History:  Procedure Laterality Date   CYST EXCISION  2001   Thyglossal duct   FOOT SURGERY Right 02/09/2021    TONSILLECTOMY  1980    FAMILY HISTORY: Family History  Problem Relation Age of Onset   Hypertension Mother    Heart disease Mother    Kidney disease Mother    Hyperlipidemia Mother    Sleep apnea Mother    Obesity Mother    Diabetes Father    Hypertension Father    Hyperlipidemia Father    Heart disease Father    Kidney disease Father    Obesity Father     SOCIAL HISTORY: Social History   Socioeconomic History   Marital status: Married    Spouse name: Darlene   Number of children: 2   Years of education: Some grad school   Highest education level: Not on file  Occupational History   Occupation:  Heavy Glass blower/designer    Comment: full time  Tobacco Use   Smoking status: Never    Passive exposure: Never   Smokeless tobacco: Never  Vaping Use   Vaping Use: Never used  Substance and Sexual Activity   Alcohol use: Yes    Alcohol/week: 0.0 standard drinks of alcohol    Comment: Occasional   Drug use: No   Sexual activity: Not on file    Comment: Married  Other Topics Concern   Not on file  Social History Narrative   Lives at home w/ his wife   Left-handed   Caffeine: 2 cups daily   Social Determinants of Health   Financial Resource Strain: Not on file  Food Insecurity: Not on file  Transportation Needs: Not on file  Physical Activity: Not on file  Stress: Not on file  Social Connections: Not on file  Intimate Partner Violence: Not on file   PHYSICAL EXAM  Vitals:   03/17/22 1300  BP: 125/72  Pulse: 63  Weight: 292 lb 9.6 oz (132.7 kg)  Height: '5\' 11"'$  (1.803 m)   Body mass index is 40.81 kg/m.  Physical exam: Exam: Gen: NAD, conversant, well nourised, obese, well groomed                     CV: RRR, no MRG. No Carotid Bruits. No peripheral edema, warm, nontender Eyes: Conjunctivae clear without exudates or hemorrhage  Neuro: Detailed Neurologic Exam  Speech:    Speech is normal; fluent and spontaneous with normal comprehension.   Cognition:    The patient is oriented to person, place, and time;     recent and remote memory intact;     language fluent;     normal attention, concentration,     fund of knowledge Cranial Nerves:    The pupils are equal, round, and reactive to light. The fundi are normal and spontaneous venous pulsations are present. Visual fields are full to finger confrontation. Extraocular movements are intact. Trigeminal sensation is intact and the muscles of mastication are normal. The face is symmetric. The palate elevates in the midline. Hearing intact. Voice is normal. Shoulder shrug is normal. The tongue has normal motion without fasciculations.   Coordination:    Normal finger to nose and heel to shin. Normal rapid alternating movements.   Gait:    Heel-toe and tandem gait are normal.   Motor Observation:    No asymmetry, no atrophy, and no involuntary movements noted. Tone:    Normal muscle tone.    Posture:    Posture is normal. normal erect    Strength:    Strength is V/V in the upper and lower limbs.      Sensation: intact to LT     Reflex Exam:  DTR's:    Deep tendon reflexes in the upper and lower extremities are normal bilaterally.   Toes:    The toes are downgoing bilaterally.   Clonus:    Clonus is absent.   DIAGNOSTIC DATA (LABS, IMAGING, TESTING) - I reviewed patient records, labs, notes, testing and imaging myself where available.  Lab Results  Component Value Date   WBC 4.5 04/15/2020   HGB 14.2 04/15/2020   HCT 41.8 04/15/2020   MCV 77.6 (L) 04/15/2020   PLT 222 04/15/2020      Component Value Date/Time   NA 141 08/19/2020 0743   K 5.0 08/19/2020 0743   CL 109 (H) 08/19/2020 0743   CO2 21 08/19/2020  0743   GLUCOSE 85 08/19/2020 0743   BUN 26 (H) 08/19/2020 0743   CREATININE 1.34 (H) 08/19/2020 0743   CALCIUM 9.4 08/19/2020 0743   PROT 7.0 08/19/2020 0743   ALBUMIN 4.3 08/19/2020 0743   AST 26 08/19/2020 0743   ALT 19 08/19/2020 0743   ALKPHOS  97 08/19/2020 0743   BILITOT 0.4 08/19/2020 0743   GFRNONAA 64 12/24/2019 0911   GFRAA 74 12/24/2019 0911   Lab Results  Component Value Date   CHOL 110 08/19/2020   HDL 39 (L) 08/19/2020   LDLCALC 54 08/19/2020   TRIG 85 08/19/2020   Lab Results  Component Value Date   HGBA1C 5.1 08/19/2020   Lab Results  Component Value Date   VITAMINB12 1,067 04/26/2019   Lab Results  Component Value Date   TSH 2.250 08/19/2020    ASSESSMENT AND PLAN 57 y.o. year old male   1.  Pseudotumor cerebri 2.  Low back pain - resolved 3. Migraines  03/17/2022: No papilledema, vision was great, following closely. Doing well on the Trokendi '300mg'$ . Recently had blood work at [facility] and doing well nothing concerning it was his physical, Tinitus went away immediately after LP, hasn't come back now that he is on 300. Sleep apnea was very bad, no snoring, lost a lot of weight. No morning headaches. Off of cpap, doesn't want to retest bc symptoms went away after losing weight. Cannot tolerate Topiramate IR cannot be tolerated at higher doses. We will ask for a PA as well. We called the trokendi hotline, found his copay card : ID [id number] grp [id number] bin [phone] pcn [place]TY. Please also send [place] a PA he cannot tolerate Topiramate IR or ER generic. Dispense as written needs brand. We reordered with the copay card and also then called the pharmacy to run it. Also contacted the trokendi rep. Got him the trokendi until septamber when we will have to see if there is a new copay card (Please call [phone]. For questions about Trokendi XR, including medication access and coverage, please call [facility] at [phone], Monday through Friday, 8:30 AM to 5:30 PM ET, excluding holidays.)  -Carl Knox is doing very well currently, is a delightful patient -Got a note from ophthalmology, eye exam last week showed no papilledema on Trokendi XR 200 mg at bedtime but OCT in left eye possibly worsened will continue  to follow -Will continue Trokendi at current dosing, in the past has tried to wean off and had return of symptoms (presented as syncopal episodes) -Encouraged to continue weight loss, over the last 2 years, has lost about 35 pounds -Back pain is under good control, will continue to see orthopedics as needed -Follow-up in 6 months or sooner if needed, will be followed by Dr. Jaynee Eagles since Dr. Jannifer Franklin has retired - Tinnitus and ear pressure: He has water in the ear with primary care but have an appointment an appointment with ENT tomorrow. He went to an allergist and is inflamed and seeing ENT tomorrow. If ENT can't address the ringing, and again it may just be hearing loss, would repeat MRi of the brain thin cuts through tthe IAC. And repeat LP. Seeing eye dotor in 6 months, mychart me with results and may need to be seen sooner or have orders as above  Sarina Ill, MD [facility] [address] [place], [place] [address] [phone]  I spent over 40 minutes of face-to-face and non-face-to-face time with patient on the  1.  Chronic migraine without aura, with intractable migraine, so stated, with status migrainosus   2. IIH (idiopathic intracranial hypertension)    diagnosis.  This included previsit chart review, lab review, study review, order entry, electronic health record documentation, patient education on the different diagnostic and therapeutic options, counseling and coordination of care, risks and benefits of management, compliance, or risk factor reduction

## 2022-04-01 ENCOUNTER — Other Ambulatory Visit (HOSPITAL_COMMUNITY): Payer: Self-pay

## 2022-04-05 ENCOUNTER — Other Ambulatory Visit (HOSPITAL_COMMUNITY): Payer: Self-pay

## 2022-06-02 ENCOUNTER — Other Ambulatory Visit (HOSPITAL_COMMUNITY): Payer: Self-pay

## 2022-06-02 ENCOUNTER — Other Ambulatory Visit: Payer: Self-pay

## 2022-06-02 ENCOUNTER — Other Ambulatory Visit: Payer: Self-pay | Admitting: Hematology & Oncology

## 2022-06-02 MED ORDER — FOLIC ACID 1 MG PO TABS
1.0000 mg | ORAL_TABLET | Freq: Every day | ORAL | 9 refills | Status: DC
  Filled 2022-06-02: qty 90, 90d supply, fill #0
  Filled 2022-09-05: qty 90, 90d supply, fill #1
  Filled 2022-12-08: qty 90, 90d supply, fill #2
  Filled 2023-03-03: qty 90, 90d supply, fill #3

## 2022-06-15 ENCOUNTER — Other Ambulatory Visit (HOSPITAL_COMMUNITY): Payer: Self-pay

## 2022-06-16 ENCOUNTER — Other Ambulatory Visit (HOSPITAL_COMMUNITY): Payer: Self-pay

## 2022-06-17 ENCOUNTER — Other Ambulatory Visit (HOSPITAL_COMMUNITY): Payer: Self-pay

## 2022-08-07 ENCOUNTER — Other Ambulatory Visit (HOSPITAL_COMMUNITY): Payer: Self-pay

## 2022-09-10 ENCOUNTER — Other Ambulatory Visit: Payer: Self-pay

## 2022-09-10 ENCOUNTER — Other Ambulatory Visit (HOSPITAL_COMMUNITY): Payer: Self-pay

## 2022-09-10 DIAGNOSIS — G932 Benign intracranial hypertension: Secondary | ICD-10-CM | POA: Diagnosis not present

## 2022-09-13 ENCOUNTER — Other Ambulatory Visit (HOSPITAL_COMMUNITY): Payer: Self-pay

## 2022-09-13 DIAGNOSIS — H9313 Tinnitus, bilateral: Secondary | ICD-10-CM | POA: Diagnosis not present

## 2022-09-13 DIAGNOSIS — H903 Sensorineural hearing loss, bilateral: Secondary | ICD-10-CM | POA: Diagnosis not present

## 2022-09-14 ENCOUNTER — Telehealth: Payer: Self-pay | Admitting: *Deleted

## 2022-09-14 ENCOUNTER — Encounter: Payer: Self-pay | Admitting: Neurology

## 2022-09-14 NOTE — Telephone Encounter (Signed)
We received FMLA renewal papers from patient's wife Carl Knox who requests  FMLA to care for her spouse.  Previously FMLA was for 4 events per 1 month with a duration of 2 days per event.  This authorization is for 1 year which would be 09/20/22 - 09/20/23.  The papers are due this Thursday 09/16/2022.

## 2022-09-14 NOTE — Telephone Encounter (Signed)
Sent wife a Clinical cytogeneticist message why she needs 8 days fmla a month since we see him at most 2x a year and he has been doing very well. If I don;t hear from her tomorrow I will call thanks

## 2022-09-15 ENCOUNTER — Telehealth: Payer: Self-pay | Admitting: *Deleted

## 2022-09-15 NOTE — Telephone Encounter (Signed)
Received update from Dr Cathey Endow @ [facility]. Date of exam: 09/10/2022, no papilledema seen on exam. Patient to follow-up with Dr Cathey Endow in 6 months.

## 2022-09-16 NOTE — Telephone Encounter (Signed)
Handled. See phone note.

## 2022-09-16 NOTE — Telephone Encounter (Signed)
Spoke with Dr Lucia Gaskins who spoke with wife. Decision was made to allow 5 occurrences per month each lasting 1 day. Form is nearly complete at this time.

## 2022-09-16 NOTE — Telephone Encounter (Signed)
FMLA completed, signed, and sent to medical records for processing.

## 2022-09-22 ENCOUNTER — Other Ambulatory Visit (HOSPITAL_COMMUNITY): Payer: Self-pay

## 2022-09-22 NOTE — Progress Notes (Unsigned)
Patient: Carl Knox Date of Birth: Mar 29, 1965  Reason for Visit: Follow up History from: Patient, wife  Primary Neurologist: Lucia Gaskins   ASSESSMENT AND PLAN 56 y.o. year old male   1.  Pseudotumor cerebri 2.  Chronic migraine headaches  -Doing well, no return of tinnitus or near syncope.  Ophthalmology evaluation shows no papilledema.  Continue brand-name Trokendi XR 300 mg daily (200+100).  Are using a co-pay card. Dr. Lucia Gaskins filled out for them last visit. [name], RN spoke with wife about completing and printed application to reapply. I will refill, likely need PA. PCP follows labs  -Continue follow-up every 6 months with ophthalmology. Can continue to work on weight loss. OSA symptoms went away, never retested for OSA.  -Follow-up in 6 months or sooner if needed  Meds ordered this encounter  Medications   TROKENDI XR 200 MG CP24    Sig: Take 1 capsule (200 mg total) by mouth at bedtime. Take with Trokendi XR 100 mg for total of 300 mg daily at bedtime.    Dispense:  90 capsule    Refill:  2    ID [id number] grp [id number] bin [id number] pcn [facility]. Please also send [place] a PA he cannot tolerate Topiramate IR or ER generic. Dispense as written needs brand.   TROKENDI XR 100 MG CP24    Sig: Take 1 capsule (100 mg total) by mouth at bedtime. Take with 200 mg capsule for total of 300 mg at bedtime.    Dispense:  90 capsule    Refill:  6    ID [id number] grp [id number] bin [id number] pcn [facility]. Please also send [place] a PA he cannot tolerate Topiramate IR or ER generic. Dispense as written needs brand.   HISTORY OF PRESENT ILLNESS: Today 09/23/22 Here for follow-up accompanied by his wife. [facility]. Date of exam: 09/10/2022, no papilledema seen on exam follow up in 6 months. LP Sept 2023 opening pressure 32 cm water. On Trokendi XR 300 mg daily (200+100), using copay card. No ringing in the ears. No near syncope spells. No headache at all. Is off CPAP, feels like sleeping well.  Weight is same at 290 lbs. PCP follows labs.  Works full-time as a Chartered certified accountant.  No new issues or concerns. [name], RN spoke with patient about completing enrollment for Trokendi co-pay card. I will send new script to pharmacy.   HISTORY  03/17/2022: No papilledema, vision was great, following closely. Doing well on the Trokendi 300mg . Recently had blood work at [facility] and doing well nothing concerning it was his physical, Tinitus went away immediately after LP, hasn't come back now that he is on 300. Sleep apnea was very bad, no snoring, lost a lot of weight. No morning headaches. Off of cpap, doesn't want to retest bc symptoms went away after losing weight. Cannot tolerate Topiramate IR cannot be tolerated at higher  doses. We will ask for a PA as well. We called the trokendi hotline, found his copay card : ID [id number] grp [id number] bin [phone] pcn [facility]. Please also send [place] a PA he cannot tolerate Topiramate IR or ER generic. Dispense as written needs brand. We reordered with the copay card and also then called the pharmacy to run it. Also contacted the trokendi rep. Got him the trokendi until septamber when we will have to see if there is a new copay card (Please call [phone]. For questions about Trokendi XR, including medication access and coverage, please call [facility] [employer] at [phone], Monday  through Friday, 8:30 AM to 5:30 PM ET, excluding holidays.)   REVIEW OF SYSTEMS: Out of a complete 14 system review of symptoms, the patient complains only of the following symptoms, and all other reviewed systems are negative.  See HPI  ALLERGIES: Allergies  Allergen Reactions   Valacyclovir Hcl Diarrhea    HOME MEDICATIONS: Outpatient Medications Prior to Visit  Medication Sig Dispense Refill   folic acid (FOLVITE) 1 MG tablet Take 1 tablet (1 mg total) by mouth daily. 90 tablet 9   levothyroxine (SYNTHROID) 75 MCG tablet Take 1 tablet (75 mcg total) by mouth in the morning on an empty  stomach 90 tablet 3   loratadine (CLARITIN) 10 MG tablet Take 10 mg by mouth daily.     Multiple Vitamin (MULTIVITAMIN) tablet Take 1 tablet by mouth daily.     mupirocin ointment (BACTROBAN) 2 % Apply 1 application topically 2 (two) times daily as needed for 7 to 10 days. 22 g 1   TROKENDI XR 100 MG CP24 Take 1 capsule (100 mg total) by mouth at bedtime. Take with 200 mg capsule for total of 300 mg at bedtime. 90 capsule 6   TROKENDI XR 200 MG CP24 Take 1 capsule (200 mg total) by mouth at bedtime. Take with Trokendi XR 100 mg for total of 300 mg daily at bedtime. 90 capsule 2   No facility-administered medications prior to visit.    PAST MEDICAL HISTORY: Past Medical History:  Diagnosis Date   Hypothyroidism    Morbid obesity (HCC)    OSA on CPAP    Other specified disorders of thyroid    Papilledema    Pneumonia    11/2021   Pseudotumor cerebri    Sickle cell trait (HCC)    Syncope and collapse 02/06/2016    PAST SURGICAL HISTORY: Past Surgical History:  Procedure Laterality Date   CYST EXCISION  2001   Thyglossal duct   FOOT SURGERY Right 02/09/2021   TONSILLECTOMY  1980    FAMILY HISTORY: Family History  Problem Relation Age of Onset   Hypertension Mother    Heart disease Mother    Kidney disease Mother    Hyperlipidemia Mother    Sleep apnea Mother    Obesity Mother    Diabetes Father    Hypertension Father    Hyperlipidemia Father    Heart disease Father    Kidney disease Father    Obesity Father     SOCIAL HISTORY: Social History   Socioeconomic History   Marital status: Married    Spouse name: Darlene   Number of children: 2   Years of education: Some grad school   Highest education level: Not on file  Occupational History   Occupation: Heavy Location manager    Comment: full time  Tobacco Use   Smoking status: Never    Passive exposure: Never   Smokeless tobacco: Never  Vaping Use   Vaping status: Never Used  Substance and Sexual Activity    Alcohol use: Yes    Alcohol/week: 0.0 standard drinks of alcohol    Comment: Occasional   Drug use: No   Sexual activity: Not on file    Comment: Married  Other Topics Concern   Not on file  Social History Narrative   Lives at home w/ his wife   Left-handed   Caffeine: 2 cups daily   Social Determinants of Health   Financial Resource Strain: Not on file  Food Insecurity: Not on file  Transportation  Needs: Not on file  Physical Activity: Not on file  Stress: Not on file  Social Connections: Not on file  Intimate Partner Violence: Not on file   PHYSICAL EXAM  Vitals:   09/23/22 1306  BP: 117/76  Pulse: 100  Weight: 290 lb 8 oz (131.8 kg)  Height: 5\' 11"  (1.803 m)   Body mass index is 40.52 kg/m.  Generalized: Well developed, in no acute distress  Neurological examination  Mentation: Alert oriented to time, place, history taking. Follows all commands speech and language fluent Cranial nerve II-XII: Pupils were equal round reactive to light. Extraocular movements were full, visual field were full on confrontational test. Facial sensation and strength were normal. Head turning and shoulder shrug were normal and symmetric. Motor: The motor testing reveals 5 over 5 strength of all 4 extremities. Good symmetric motor tone is noted throughout.  Sensory: Sensory testing is intact to soft touch on all 4 extremities. No evidence of extinction is noted.  Coordination: Cerebellar testing reveals good finger-nose-finger and heel-to-shin bilaterally.  Gait and station: Gait is normal.  Reflexes: Deep tendon reflexes are symmetric and normal bilaterally.   DIAGNOSTIC DATA (LABS, IMAGING, TESTING) - I reviewed patient records, labs, notes, testing and imaging myself where available.  Lab Results  Component Value Date   WBC 4.5 04/15/2020   HGB 14.2 04/15/2020   HCT 41.8 04/15/2020   MCV 77.6 (L) 04/15/2020   PLT 222 04/15/2020      Component Value Date/Time   NA 141  08/19/2020 0743   K 5.0 08/19/2020 0743   CL 109 (H) 08/19/2020 0743   CO2 21 08/19/2020 0743   GLUCOSE 85 08/19/2020 0743   BUN 26 (H) 08/19/2020 0743   CREATININE 1.34 (H) 08/19/2020 0743   CALCIUM 9.4 08/19/2020 0743   PROT 7.0 08/19/2020 0743   ALBUMIN 4.3 08/19/2020 0743   AST 26 08/19/2020 0743   ALT 19 08/19/2020 0743   ALKPHOS 97 08/19/2020 0743   BILITOT 0.4 08/19/2020 0743   GFRNONAA 64 12/24/2019 0911   GFRAA 74 12/24/2019 0911   Lab Results  Component Value Date   CHOL 110 08/19/2020   HDL 39 (L) 08/19/2020   LDLCALC 54 08/19/2020   TRIG 85 08/19/2020   Lab Results  Component Value Date   HGBA1C 5.1 08/19/2020   Lab Results  Component Value Date   VITAMINB12 1,067 04/26/2019   Lab Results  Component Value Date   TSH 2.250 08/19/2020    Margie Ege, AGNP-C, DNP 09/23/2022, 1:21 PM [facility] [address], [address] [place], [place] [address] [phone]

## 2022-09-23 ENCOUNTER — Encounter: Payer: Self-pay | Admitting: Neurology

## 2022-09-23 ENCOUNTER — Ambulatory Visit: Payer: Commercial Managed Care - PPO | Admitting: Neurology

## 2022-09-23 ENCOUNTER — Other Ambulatory Visit (HOSPITAL_COMMUNITY): Payer: Self-pay

## 2022-09-23 DIAGNOSIS — G932 Benign intracranial hypertension: Secondary | ICD-10-CM | POA: Diagnosis not present

## 2022-09-23 DIAGNOSIS — G43711 Chronic migraine without aura, intractable, with status migrainosus: Secondary | ICD-10-CM

## 2022-09-23 MED ORDER — TROKENDI XR 100 MG PO CP24
100.0000 mg | ORAL_CAPSULE | Freq: Every day | ORAL | 6 refills | Status: DC
  Filled 2022-09-23 – 2022-12-14 (×2): qty 90, 90d supply, fill #0
  Filled 2023-03-11: qty 90, 90d supply, fill #1

## 2022-09-23 MED ORDER — TROKENDI XR 200 MG PO CP24
200.0000 mg | ORAL_CAPSULE | Freq: Every day | ORAL | 2 refills | Status: DC
  Filled 2022-09-23 – 2022-12-14 (×2): qty 90, 90d supply, fill #0
  Filled 2023-03-11: qty 90, 90d supply, fill #1
  Filled 2023-06-14: qty 90, 90d supply, fill #2

## 2022-09-23 NOTE — Patient Instructions (Signed)
Great to see you today.  I will refill the brand-name Trokendi.  See you back in 6 months.  Please let us know if you have any change in your condition.  Thanks!!

## 2022-09-28 DIAGNOSIS — S0101XA Laceration without foreign body of scalp, initial encounter: Secondary | ICD-10-CM | POA: Diagnosis not present

## 2022-09-28 DIAGNOSIS — L7211 Pilar cyst: Secondary | ICD-10-CM | POA: Diagnosis not present

## 2022-10-22 DIAGNOSIS — Z9889 Other specified postprocedural states: Secondary | ICD-10-CM | POA: Diagnosis not present

## 2022-10-22 DIAGNOSIS — E039 Hypothyroidism, unspecified: Secondary | ICD-10-CM | POA: Diagnosis not present

## 2022-10-22 DIAGNOSIS — E669 Obesity, unspecified: Secondary | ICD-10-CM | POA: Diagnosis not present

## 2022-10-22 DIAGNOSIS — E236 Other disorders of pituitary gland: Secondary | ICD-10-CM | POA: Diagnosis not present

## 2022-11-03 ENCOUNTER — Other Ambulatory Visit (HOSPITAL_COMMUNITY): Payer: Self-pay

## 2022-11-11 DIAGNOSIS — L720 Epidermal cyst: Secondary | ICD-10-CM | POA: Diagnosis not present

## 2022-11-12 ENCOUNTER — Other Ambulatory Visit (HOSPITAL_COMMUNITY): Payer: Self-pay

## 2022-11-23 ENCOUNTER — Encounter (HOSPITAL_BASED_OUTPATIENT_CLINIC_OR_DEPARTMENT_OTHER): Payer: Self-pay | Admitting: Surgery

## 2022-11-23 DIAGNOSIS — L720 Epidermal cyst: Secondary | ICD-10-CM | POA: Diagnosis present

## 2022-11-23 NOTE — H&P (Signed)
REFERRING PHYSICIAN: Hyacinth Meeker, [place], [place]  PROVIDER: Myrakle Wingler Myra Rude, MD   Chief Complaint: New Consultation (Mass on scalp)  History of Present Illness:  Patient is referred by his primary care provider, Evangeline Dakin, [place], for surgical evaluation and management of a cystic mass on the right posterior scalp. This has been present for a number of years and gradually increasing in size. It causes problems when the patient shaves his head and he has had bleeding in the past. Patient notes that there was a mole at this location which was removed when he was a teenager. Patient now presents for consideration for excision of an enlarging cyst on the right posterior scalp.  Review of Systems: A complete review of systems was obtained from the patient. I have reviewed this information and discussed as appropriate with the patient. See HPI as well for other ROS.  ROS   Medical History: Past Medical History:  Diagnosis Date  Chronic kidney disease  Sleep apnea  Thyroid disease   Patient Active Problem List  Diagnosis  Epidermal cyst   Past Surgical History:  Procedure Laterality Date  EXCISION THYROGLOSSAL DUCT CYST/SINUS 10/03/1998  foot surgery Right  TONSILLECTOMY    Allergies  Allergen Reactions  Valacyclovir Diarrhea and Other (See Comments)   Current Outpatient Medications on File Prior to Visit  Medication Sig Dispense Refill  folic acid (FOLVITE) 1 MG tablet Take 1 mg by mouth  levothyroxine (SYNTHROID) 75 MCG tablet 1 TABLET ON AN EMPTY STOMACH IN THE MORNING  multivitamin tablet Take 1 tablet by mouth once daily  TROKENDI XR 100 mg XR capsule  TROKENDI XR 200 mg XR capsule 1 capsule Orally Once a day at bedtime   No current facility-administered medications on file prior to visit.   Family History  Problem Relation Age of Onset  Hyperlipidemia (Elevated cholesterol) Mother  High blood pressure (Hypertension) Mother  Coronary Artery Disease (Blocked  arteries around heart) Mother  High blood pressure (Hypertension) Father  Coronary Artery Disease (Blocked arteries around heart) Father  Diabetes Father    Social History   Tobacco Use  Smoking Status Never  Smokeless Tobacco Never    Social History   Socioeconomic History  Marital status: Married  Tobacco Use  Smoking status: Never  Smokeless tobacco: Never  Vaping Use  Vaping status: Never Used  Substance and Sexual Activity  Alcohol use: Defer  Drug use: Never   Objective:   Vitals:  11/11/22 1616  PainSc: 0-No pain   There is no height or weight on file to calculate BMI.  Physical Exam   Limited examination  There is a 2.0 cm in diameter mass located subcutaneously in the right posterior scalp. This is most consistent with an epidermal inclusion cyst. There are no overlying cutaneous changes. No other such lesions.   Assessment and Plan:   Epidermal cyst  Patient presents on referral from his primary care provider for evaluation of an enlarging mass on the right posterior scalp. This appears to be an epidermal inclusion cyst. It measures about 2 cm in greatest dimension.  Today we discussed surgical excision under sedation and local anesthesia. This will be an outpatient procedure. We discussed postoperative wound care. The patient works in an occupation where he is required to wear a hard hat. This would fit approximately right where the surgical wound would be. Patient may need to be out of work for up to a week before he will be able to wear a hard  hat comfortably.   Darnell Level, MD [facility] practice Office: [phone]

## 2022-11-25 ENCOUNTER — Ambulatory Visit: Payer: Self-pay | Admitting: Surgery

## 2022-11-25 ENCOUNTER — Encounter (HOSPITAL_BASED_OUTPATIENT_CLINIC_OR_DEPARTMENT_OTHER): Payer: Self-pay | Admitting: Surgery

## 2022-11-25 NOTE — Progress Notes (Signed)
Spoke w/ via phone for pre-op interview--- Carl Knox and wife [name] Lab needs dos----   [facility] results------ COVID test -----patient states asymptomatic no test needed Arrive at -------0830 NPO after MN NO Solid Food.  Clear liquids from MN until---0730 Med rec completed Medications to take morning of surgery -----Trokendi and Synthroid Diabetic medication ----- Patient instructed no nail polish to be worn day of surgery Patient instructed to bring photo id and insurance card day of surgery Patient aware to have Driver (ride ) / caregiver    for 24 hours after surgery - wife [name] Patient Special Instructions ----- Pre-Op special Instructions ----- Patient verbalized understanding of instructions that were given at this phone interview. Patient denies chest pain, sob, fever, cough at the interview.

## 2022-11-28 ENCOUNTER — Encounter (HOSPITAL_BASED_OUTPATIENT_CLINIC_OR_DEPARTMENT_OTHER): Payer: Self-pay | Admitting: Surgery

## 2022-12-02 ENCOUNTER — Encounter (HOSPITAL_BASED_OUTPATIENT_CLINIC_OR_DEPARTMENT_OTHER): Payer: Self-pay | Admitting: Surgery

## 2022-12-02 ENCOUNTER — Other Ambulatory Visit: Payer: Self-pay

## 2022-12-02 ENCOUNTER — Ambulatory Visit (HOSPITAL_BASED_OUTPATIENT_CLINIC_OR_DEPARTMENT_OTHER)
Admission: RE | Admit: 2022-12-02 | Discharge: 2022-12-02 | Disposition: A | Discharge status: Home / Self Care | Payer: Commercial Managed Care - PPO | Attending: Surgery | Admitting: Surgery

## 2022-12-02 ENCOUNTER — Ambulatory Visit (HOSPITAL_BASED_OUTPATIENT_CLINIC_OR_DEPARTMENT_OTHER): Payer: Commercial Managed Care - PPO | Admitting: Certified Registered Nurse Anesthetist

## 2022-12-02 ENCOUNTER — Other Ambulatory Visit (HOSPITAL_COMMUNITY): Payer: Self-pay

## 2022-12-02 ENCOUNTER — Ambulatory Visit (HOSPITAL_BASED_OUTPATIENT_CLINIC_OR_DEPARTMENT_OTHER): Payer: Self-pay | Admitting: Certified Registered Nurse Anesthetist

## 2022-12-02 ENCOUNTER — Encounter (HOSPITAL_BASED_OUTPATIENT_CLINIC_OR_DEPARTMENT_OTHER)
Admission: RE | Disposition: A | Discharge status: Home / Self Care | Payer: Self-pay | Source: Home / Self Care | Attending: Surgery

## 2022-12-02 DIAGNOSIS — N189 Chronic kidney disease, unspecified: Secondary | ICD-10-CM | POA: Diagnosis not present

## 2022-12-02 DIAGNOSIS — L720 Epidermal cyst: Secondary | ICD-10-CM | POA: Diagnosis not present

## 2022-12-02 DIAGNOSIS — Z6839 Body mass index (BMI) 39.0-39.9, adult: Secondary | ICD-10-CM | POA: Insufficient documentation

## 2022-12-02 DIAGNOSIS — L7212 Trichodermal cyst: Secondary | ICD-10-CM | POA: Insufficient documentation

## 2022-12-02 DIAGNOSIS — G4733 Obstructive sleep apnea (adult) (pediatric): Secondary | ICD-10-CM | POA: Diagnosis not present

## 2022-12-02 HISTORY — PX: CYST EXCISION: SHX5701

## 2022-12-02 HISTORY — DX: Chronic kidney disease, unspecified: N18.9

## 2022-12-02 HISTORY — DX: Headache, unspecified: R51.9

## 2022-12-02 SURGERY — CYST REMOVAL
Anesthesia: Monitor Anesthesia Care | Site: Scalp | Laterality: Right

## 2022-12-02 MED ORDER — TRAMADOL HCL 50 MG PO TABS
50.0000 mg | ORAL_TABLET | Freq: Four times a day (QID) | ORAL | 0 refills | Status: AC | PRN
  Filled 2022-12-02: qty 10, 3d supply, fill #0

## 2022-12-02 MED ORDER — OXYCODONE HCL 5 MG/5ML PO SOLN
5.0000 mg | Freq: Once | ORAL | Status: DC | PRN
Start: 2022-12-02 — End: 2022-12-02

## 2022-12-02 MED ORDER — FENTANYL CITRATE (PF) 100 MCG/2ML IJ SOLN: 25.0000 ug | INTRAMUSCULAR | Status: DC | PRN

## 2022-12-02 MED ORDER — FENTANYL CITRATE (PF) 250 MCG/5ML IJ SOLN
INTRAMUSCULAR | Status: DC | PRN
  Administered 2022-12-02: 50 ug via INTRAVENOUS

## 2022-12-02 MED ORDER — 0.9 % SODIUM CHLORIDE (POUR BTL) OPTIME
TOPICAL | Status: DC | PRN
  Administered 2022-12-02: 500 mL

## 2022-12-02 MED ORDER — CHLORHEXIDINE GLUCONATE CLOTH 2 % EX PADS: 6.0000 | MEDICATED_PAD | Freq: Once | CUTANEOUS | Status: DC

## 2022-12-02 MED ORDER — BUPIVACAINE-EPINEPHRINE 0.25% -1:200000 IJ SOLN
INTRAMUSCULAR | Status: DC | PRN
  Administered 2022-12-02: 7 mL

## 2022-12-02 MED ORDER — LEVOTHYROXINE SODIUM 75 MCG PO TABS
75.0000 ug | ORAL_TABLET | Freq: Every day | ORAL | 3 refills | Status: DC
  Filled 2022-12-02: qty 90, 90d supply, fill #0
  Filled 2023-03-03: qty 90, 90d supply, fill #1
  Filled 2023-06-03: qty 90, 90d supply, fill #2
  Filled 2023-09-02: qty 90, 90d supply, fill #3

## 2022-12-02 MED ORDER — PROPOFOL 10 MG/ML IV BOLUS
INTRAVENOUS | Status: DC | PRN
  Administered 2022-12-02: 20 mg via INTRAVENOUS

## 2022-12-02 MED ORDER — CEFAZOLIN IN SODIUM CHLORIDE 3-0.9 GM/100ML-% IV SOLN
3.0000 g | INTRAVENOUS | Status: AC
  Administered 2022-12-02: 3 g via INTRAVENOUS

## 2022-12-02 MED ORDER — LIDOCAINE 2% (20 MG/ML) 5 ML SYRINGE
INTRAMUSCULAR | Status: DC | PRN
  Administered 2022-12-02: 40 mg via INTRAVENOUS

## 2022-12-02 MED ORDER — DROPERIDOL 2.5 MG/ML IJ SOLN: 0.6250 mg | Freq: Once | INTRAMUSCULAR | Status: DC | PRN

## 2022-12-02 MED ORDER — ACETAMINOPHEN 10 MG/ML IV SOLN: 1000.0000 mg | Freq: Once | INTRAVENOUS | Status: DC | PRN

## 2022-12-02 MED ORDER — MIDAZOLAM HCL 2 MG/2ML IJ SOLN
INTRAMUSCULAR | Status: DC | PRN
  Administered 2022-12-02: 2 mg via INTRAVENOUS

## 2022-12-02 MED ORDER — PROPOFOL 500 MG/50ML IV EMUL
INTRAVENOUS | Status: DC | PRN
  Administered 2022-12-02: 180 ug/kg/min via INTRAVENOUS

## 2022-12-02 MED ORDER — STERILE WATER FOR IRRIGATION IR SOLN
Status: DC | PRN
  Administered 2022-12-02: 500 mL

## 2022-12-02 MED ORDER — ACETAMINOPHEN 325 MG PO TABS: 325.0000 mg | ORAL_TABLET | ORAL | Status: DC | PRN

## 2022-12-02 MED ORDER — FENTANYL CITRATE (PF) 100 MCG/2ML IJ SOLN
INTRAMUSCULAR | Status: AC
  Filled 2022-12-02: qty 2

## 2022-12-02 MED ORDER — BUPIVACAINE-EPINEPHRINE 0.5% -1:200000 IJ SOLN: INTRAMUSCULAR | Status: DC | PRN

## 2022-12-02 MED ORDER — ONDANSETRON HCL 4 MG/2ML IJ SOLN
INTRAMUSCULAR | Status: DC | PRN
  Administered 2022-12-02: 4 mg via INTRAVENOUS

## 2022-12-02 MED ORDER — ACETAMINOPHEN 160 MG/5ML PO SOLN: 325.0000 mg | ORAL | Status: DC | PRN

## 2022-12-02 MED ORDER — CEFAZOLIN IN SODIUM CHLORIDE 3-0.9 GM/100ML-% IV SOLN
INTRAVENOUS | Status: AC
  Filled 2022-12-02: qty 100

## 2022-12-02 MED ORDER — SODIUM CHLORIDE 0.9 % IV SOLN: INTRAVENOUS | Status: DC

## 2022-12-02 MED ORDER — MIDAZOLAM HCL 2 MG/2ML IJ SOLN
INTRAMUSCULAR | Status: AC
Start: 2022-12-02 — End: ?
  Filled 2022-12-02: qty 2

## 2022-12-02 MED ORDER — OXYCODONE HCL 5 MG PO TABS: 5.0000 mg | ORAL_TABLET | Freq: Once | ORAL | Status: DC | PRN

## 2022-12-02 MED ORDER — EPHEDRINE SULFATE-NACL 50-0.9 MG/10ML-% IV SOSY
PREFILLED_SYRINGE | INTRAVENOUS | Status: DC | PRN
  Administered 2022-12-02 (×3): 5 mg via INTRAVENOUS

## 2022-12-02 SURGICAL SUPPLY — 34 items
BENZOIN TINCTURE PRP APPL 2/3 (GAUZE/BANDAGES/DRESSINGS) IMPLANT
BLADE SURG 15 STRL LF DISP TIS (BLADE) ×1 IMPLANT
BLADE SURG 15 STRL SS (BLADE) ×1
BNDG GAUZE DERMACEA FLUFF 4 (GAUZE/BANDAGES/DRESSINGS) IMPLANT
CHLORAPREP W/TINT 26 (MISCELLANEOUS) ×1 IMPLANT
COVER BACK TABLE 60X90IN (DRAPES) ×1 IMPLANT
COVER MAYO STAND STRL (DRAPES) ×1 IMPLANT
DERMABOND ADVANCED .7 DNX12 (GAUZE/BANDAGES/DRESSINGS) ×1 IMPLANT
DRAPE SHEET LG 3/4 BI-LAMINATE (DRAPES) IMPLANT
DRAPE UTILITY XL STRL (DRAPES) ×1 IMPLANT
ELECT REM PT RETURN 9FT ADLT (ELECTROSURGICAL) ×1
ELECTRODE REM PT RTRN 9FT ADLT (ELECTROSURGICAL) ×1 IMPLANT
GAUZE 4X4 16PLY ~~LOC~~+RFID DBL (SPONGE) IMPLANT
GAUZE SPONGE 4X4 12PLY STRL (GAUZE/BANDAGES/DRESSINGS) IMPLANT
GLOVE BIOGEL PI IND STRL 7.0 (GLOVE) IMPLANT
GLOVE SURG ORTHO 8.0 STRL STRW (GLOVE) ×1 IMPLANT
GLOVE SURG SS PI 7.0 STRL IVOR (GLOVE) IMPLANT
GOWN STRL REUS W/TWL LRG LVL3 (GOWN DISPOSABLE) ×1 IMPLANT
KIT TURNOVER CYSTO (KITS) ×1 IMPLANT
NDL HYPO 25X1 1.5 SAFETY (NEEDLE) ×1 IMPLANT
NEEDLE HYPO 25X1 1.5 SAFETY (NEEDLE) ×1
NS IRRIG 500ML POUR BTL (IV SOLUTION) ×1 IMPLANT
PACK BASIN DAY SURGERY FS (CUSTOM PROCEDURE TRAY) ×1 IMPLANT
PENCIL SMOKE EVACUATOR (MISCELLANEOUS) ×1 IMPLANT
SLEEVE SCD COMPRESS KNEE MED (STOCKING) ×1 IMPLANT
SUT ETHILON 3 0 PS 1 (SUTURE) IMPLANT
SUT MNCRL AB 4-0 PS2 18 (SUTURE) IMPLANT
SUT VIC AB 3-0 SH 8-18 (SUTURE) IMPLANT
SYR CONTROL 10ML LL (SYRINGE) ×1 IMPLANT
TOWEL OR 17X24 6PK STRL BLUE (TOWEL DISPOSABLE) ×1 IMPLANT
TRAY DSU PREP LF (CUSTOM PROCEDURE TRAY) IMPLANT
TUBE CONNECTING 12X1/4 (SUCTIONS) IMPLANT
WATER STERILE IRR 500ML POUR (IV SOLUTION) IMPLANT
YANKAUER SUCT BULB TIP NO VENT (SUCTIONS) IMPLANT

## 2022-12-02 NOTE — Anesthesia Preprocedure Evaluation (Signed)
Anesthesia Evaluation  Patient identified by MRN, date of birth, ID band Patient awake    Reviewed: Allergy & Precautions, NPO status , Patient's Chart, lab work & pertinent test results  Airway Mallampati: I  TM Distance: >3 FB Neck ROM: Full    Dental  (+) Teeth Intact, Dental Advisory Given   Pulmonary sleep apnea    breath sounds clear to auscultation       Cardiovascular  Rhythm:Regular Rate:Normal     Neuro/Psych  Headaches  negative psych ROS   GI/Hepatic negative GI ROS, Neg liver ROS,,,  Endo/Other  Hypothyroidism    Renal/GU Renal disease     Musculoskeletal  (+) Arthritis ,    Abdominal   Peds  Hematology  (+) Blood dyscrasia, Sickle cell trait   Anesthesia Other Findings   Reproductive/Obstetrics                             Anesthesia Physical Anesthesia Plan  ASA: 2  Anesthesia Plan: MAC   Post-op Pain Management:    Induction: Intravenous  PONV Risk Score and Plan: 2 and Ondansetron, Midazolam and Propofol infusion  Airway Management Planned: Natural Airway and Nasal Cannula  Additional Equipment: None  Intra-op Plan:   Post-operative Plan:   Informed Consent: I have reviewed the patients History and Physical, chart, labs and discussed the procedure including the risks, benefits and alternatives for the proposed anesthesia with the patient or authorized representative who has indicated his/her understanding and acceptance.       Plan Discussed with: CRNA  Anesthesia Plan Comments:        Anesthesia Quick Evaluation

## 2022-12-02 NOTE — Discharge Instructions (Addendum)
  Post Anesthesia Home Care Instructions  Activity: Get plenty of rest for the remainder of the day. A responsible individual must stay with you for 24 hours following the procedure.  For the next 24 hours, DO NOT: -Drive a car -Advertising copywriter -Drink alcoholic beverages -Take any medication unless instructed by your physician -Make any legal decisions or sign important papers.  Meals: Start with liquid foods such as gelatin or soup. Progress to regular foods as tolerated. Avoid greasy, spicy, heavy foods. If nausea and/or vomiting occur, drink only clear liquids until the nausea and/or vomiting subsides. Call your physician if vomiting continues.  Special Instructions/Symptoms: Your throat may feel dry or sore from the anesthesia or the breathing tube placed in your throat during surgery. If this causes discomfort, gargle with warm salt water. The discomfort should disappear within 24 hours.      [facility] -- DISCHARGE INSTRUCTIONS  REMINDER:   Carry a list of your medications and allergies with you at all times  Call your pharmacy at least 1 week in advance to refill prescriptions  Do not mix any prescribed pain medicine with alcohol  Do not drive any motor vehicles while taking pain medication  Take medications with food unless otherwise directed  Follow-up appointments (date to return to physician): Please call [phone] to confirm your follow up appointment with your surgeon.  Call your Surgeon if you have:  Temperature greater than 101.0  Persistent nausea and vomiting  Severe uncontrolled pain  Redness, tenderness, or signs of infection (pain, swelling, redness, odor or green/yellow discharge around the site)  Difficulty breathing, headache or visual disturbances  Hives  Persistent dizziness or light-headedness  Any other questions or concerns you may have after discharge  In an emergency, call 911 or go to an Emergency Department at a nearby  hospital.  Diet: Begin with liquids, and if they are tolerated, resume your usual diet.  Avoid spicy, greasy or heavy foods.  If you have nausea or vomiting, go back to liquids.  If you cannot keep liquids down, call your doctor.  Avoid alcohol consumption while on prescription pain medications. Good nutrition promotes healing. Increase fiber and fluids.   ADDITIONAL INSTRUCTIONS: May shower.  Leave Dermabond in place for 7-10 days.  Ice pack to area as needed.  [facility] Office: [phone]

## 2022-12-02 NOTE — Anesthesia Postprocedure Evaluation (Signed)
Anesthesia Post Note  Patient: Carl Knox  Procedure(s) Performed: EXCISION CYST RIGHT POSTERIOR SCALP (Right: Scalp)     Patient location during evaluation: PACU Anesthesia Type: MAC Level of consciousness: awake and alert Pain management: pain level controlled Vital Signs Assessment: post-procedure vital signs reviewed and stable Respiratory status: spontaneous breathing, nonlabored ventilation, respiratory function stable and patient connected to nasal cannula oxygen Cardiovascular status: stable and blood pressure returned to baseline Postop Assessment: no apparent nausea or vomiting Anesthetic complications: no  No notable events documented.  Last Vitals:  Vitals:   12/02/22 1152 12/02/22 1209  BP:  117/73  Pulse:  (!) 56  Resp:  15  Temp: 36.4 C (!) 36 C  SpO2:  98%    Last Pain:  Vitals:   12/02/22 1209  TempSrc: Oral  PainSc: 0-No pain                 Shelton Silvas

## 2022-12-02 NOTE — Transfer of Care (Signed)
Immediate Anesthesia Transfer of Care Note  Patient: Carl Knox  Procedure(s) Performed: EXCISION CYST RIGHT POSTERIOR SCALP (Right: Scalp)  Patient Location: PACU  Anesthesia Type:MAC  Level of Consciousness: drowsy and patient cooperative  Airway & Oxygen Therapy: Patient Spontanous Breathing and Patient connected to face mask oxygen  Post-op Assessment: Report given to RN and Post -op Vital signs reviewed and stable  Post vital signs: Reviewed and stable  Last Vitals:  Vitals Value Taken Time  BP    Temp    Pulse 87 12/02/22 1115  Resp 13 12/02/22 1115  SpO2 100 % 12/02/22 1115  Vitals shown include unfiled device data.  Last Pain:  Vitals:   12/02/22 0855  TempSrc: Oral  PainSc: 0-No pain      Patients Stated Pain Goal: 5 (12/02/22 0855)  Complications: No notable events documented.

## 2022-12-02 NOTE — Interval H&P Note (Signed)
History and Physical Interval Note:  12/02/2022 10:18 AM  Carl Knox  has presented today for surgery, with the diagnosis of EPIDERMAL CYST OF SCALP.  The various methods of treatment have been discussed with the patient and family. After consideration of risks, benefits and other options for treatment, the patient has consented to    Procedure(s): EXCISION CYST RIGHT POSTERIOR SCALP (Right) as a surgical intervention.    The patient's history has been reviewed, patient examined, no change in status, stable for surgery.  I have reviewed the patient's chart and labs.  Questions were answered to the patient's satisfaction.    Darnell Level, MD [facility] practice Office: [phone]   Darnell Level

## 2022-12-02 NOTE — Op Note (Signed)
Operative Note  Pre-operative Diagnosis:  epidermal cyst right posterior scalp  Post-operative Diagnosis:  same  Surgeon:  Darnell Level, MD  Assistant:  none   Procedure:  excision of epidermal cyst, subcutaneous, 2 x 1 x 1 cm  Anesthesia:  local with IV sedation  Estimated Blood Loss:  5 cc  Drains: none         Specimen: to pathology  Indications:  Patient is referred by his primary care provider, Evangeline Dakin, PA, for surgical evaluation and management of a cystic mass on the right posterior scalp. This has been present for a number of years and gradually increasing in size. It causes problems when the patient shaves his head and he has had bleeding in the past. Patient notes that there was a mole at this location which was removed when he was a teenager. Patient now presents for consideration for excision of an enlarging cyst on the right posterior scalp.  Procedure:  The patient was seen in the pre-op holding area. The risks, benefits, complications, treatment options, and expected outcomes were previously discussed with the patient. The patient agreed with the proposed plan and has signed the informed consent form.  The patient was brought to the operating room by the surgical team, identified as Larrie Kass and the procedure verified. A "time out" was completed and the above information confirmed.  Patient was positioned in a left lateral decubitus position.  Beanbag was used to secure the patient.  Following administration of intravenous sedation the skin of the right posterior scalp was prepped and draped in the usual aseptic fashion.  Local anesthetic was infiltrated into the skin and subcutaneous tissues.  Using a #15 blade an ellipse of skin is excised from overlying the mass.  Using sharp dissection the underlying mass is mobilized and excised in its entirety.  It measures 2 x 1 x 1 cm and is submitted to pathology for review.  Hemostasis is achieved with the electrocautery.   Skin edges are reapproximated with interrupted 4-0 Monocryl subcuticular sutures.  Wound was washed and dried and Dermabond is applied as dressing.  Patient is awakened from anesthesia and transported to recovery.  The patient tolerated the procedure well.   Darnell Level, MD [facility] Office: [phone]

## 2022-12-03 LAB — SURGICAL PATHOLOGY

## 2022-12-06 ENCOUNTER — Encounter (HOSPITAL_BASED_OUTPATIENT_CLINIC_OR_DEPARTMENT_OTHER): Payer: Self-pay | Admitting: Surgery

## 2022-12-08 ENCOUNTER — Other Ambulatory Visit (HOSPITAL_COMMUNITY): Payer: Self-pay

## 2022-12-14 ENCOUNTER — Other Ambulatory Visit (HOSPITAL_COMMUNITY): Payer: Self-pay

## 2022-12-14 ENCOUNTER — Telehealth: Payer: Self-pay | Admitting: Neurology

## 2022-12-14 ENCOUNTER — Other Ambulatory Visit: Payer: Self-pay

## 2022-12-14 NOTE — Telephone Encounter (Signed)
Phone room: Please let pt know that we do not have samples in stock currently and that we apologize! Thanks,  Production assistant, radio

## 2022-12-14 NOTE — Telephone Encounter (Signed)
Pt's wife,Darlene Rich Number; Pharmacy said they cannot fill the prescription for TROKENDI XR 100 MG CP24 until tomorrow. Asking if [facility] some samples of the medication until tomorrow. Would like a call back.

## 2022-12-15 ENCOUNTER — Other Ambulatory Visit (HOSPITAL_COMMUNITY): Payer: Self-pay

## 2023-03-03 ENCOUNTER — Other Ambulatory Visit (HOSPITAL_COMMUNITY): Payer: Self-pay

## 2023-03-09 DIAGNOSIS — Z1211 Encounter for screening for malignant neoplasm of colon: Secondary | ICD-10-CM | POA: Diagnosis not present

## 2023-03-09 DIAGNOSIS — J301 Allergic rhinitis due to pollen: Secondary | ICD-10-CM | POA: Diagnosis not present

## 2023-03-09 DIAGNOSIS — E230 Hypopituitarism: Secondary | ICD-10-CM | POA: Diagnosis not present

## 2023-03-09 DIAGNOSIS — G4733 Obstructive sleep apnea (adult) (pediatric): Secondary | ICD-10-CM | POA: Diagnosis not present

## 2023-03-09 DIAGNOSIS — Z0000 Encounter for general adult medical examination without abnormal findings: Secondary | ICD-10-CM | POA: Diagnosis not present

## 2023-03-09 DIAGNOSIS — G932 Benign intracranial hypertension: Secondary | ICD-10-CM | POA: Diagnosis not present

## 2023-03-09 DIAGNOSIS — Z125 Encounter for screening for malignant neoplasm of prostate: Secondary | ICD-10-CM | POA: Diagnosis not present

## 2023-03-09 DIAGNOSIS — E039 Hypothyroidism, unspecified: Secondary | ICD-10-CM | POA: Diagnosis not present

## 2023-03-10 LAB — LAB REPORT - SCANNED: EGFR: 65

## 2023-03-11 ENCOUNTER — Other Ambulatory Visit (HOSPITAL_COMMUNITY): Payer: Self-pay

## 2023-03-14 ENCOUNTER — Other Ambulatory Visit (HOSPITAL_COMMUNITY): Payer: Self-pay

## 2023-04-20 NOTE — Progress Notes (Unsigned)
 Patient: Carl Knox Date of Birth: [birth year]  Reason for Visit: Follow up History from: Patient, wife  Primary Neurologist: Carl Knox   ASSESSMENT AND PLAN 58 y.o. year old male   1.  Pseudotumor cerebri 2.  Chronic migraine headaches  -Doing well, no return of tinnitus or near syncope.  Ophthalmology evaluation shows no papilledema.  Continue brand-name Trokendi XR 300 mg daily (200+100).  Are using a co-pay card. Dr. Lucia Knox filled out for them last visit. [name], RN spoke with wife about completing and printed application to reapply. I will refill, likely need PA. PCP follows labs  -Continue follow-up every 6 months with ophthalmology. Can continue to work on weight loss. OSA symptoms went away, never retested for OSA.  -Follow-up in 6 months or sooner if needed  No orders of the defined types were placed in this encounter.  HISTORY OF PRESENT ILLNESS: Today 04/20/23   09/23/22 SS: Here for follow-up accompanied by his wife. [facility]. Date of exam: 09/10/2022, no papilledema seen on exam follow up in 6 months. LP Sept 2023 opening pressure 32 cm water. On Trokendi XR 300 mg daily (200+100), using copay card. No ringing in the ears. No near syncope spells. No headache at all. Is off CPAP, feels like sleeping well. Weight is same at 290 lbs. PCP follows labs.  Works full-time as a Chartered certified accountant.  No new issues or concerns. [name], RN spoke with patient about completing enrollment for Trokendi co-pay card. I will send new script to pharmacy.   HISTORY  03/17/2022: No papilledema, vision was great, following closely. Doing well on the Trokendi 300mg . Recently had blood work at [facility] and doing well nothing concerning it was his physical, T[place]us went away immediately after LP, hasn't come back now that he is on 300. Sleep apnea was very bad, no snoring, lost a lot of weight. No morning headaches. Off of cpap, doesn't want to retest bc symptoms went away after losing weight. Cannot  tolerate Topiramate IR cannot be tolerated at higher  doses. We will ask for a PA as well. We called the trokendi hotline, found his copay card : ID [id number] grp [id number] bin [phone] pcn [id number]. Please also send [place] a PA he cannot tolerate Topiramate IR or ER generic. Dispense as written needs brand. We reordered with the copay card and also then called the pharmacy to run it. Also contacted the trokendi rep. Got him the trokendi until septamber when we will have to see if there is a new copay card (Please call [phone]. For questions about Trokendi XR, including medication access and coverage, please call [employer] at [phone], Monday through Friday, 8:30 AM to 5:30 PM ET, excluding holidays.)   REVIEW OF SYSTEMS: Out of a complete 14 system review of symptoms, the patient complains only of the following symptoms, and all other reviewed systems are negative.  See HPI  ALLERGIES: Allergies  Allergen Reactions   Bactrim [Sulfamethoxazole-Trimethoprim] Diarrhea   Valacyclovir Hcl Diarrhea    HOME MEDICATIONS: Outpatient Medications Prior to Visit  Medication Sig Dispense Refill   folic acid (FOLVITE) 1 MG tablet Take 1 tablet (1 mg total) by mouth daily. 90 tablet 9   levothyroxine (SYNTHROID) 75 MCG tablet Take 1 tablet (75 mcg total) by mouth every morning on an empty stomach. 90 tablet 3   loratadine (CLARITIN) 10 MG tablet Take 10 mg by mouth daily.     Multiple Vitamin (MULTIVITAMIN) tablet Take 1 tablet by mouth daily.  mupirocin ointment (BACTROBAN) 2 % Apply 1 application topically 2 (two) times daily as needed for 7 to 10 days. 22 g 1   traMADol (ULTRAM) 50 MG tablet Take 1 tablet (50 mg total) by mouth every 6 (six) hours as needed for moderate pain (pain score 4-6). 10 tablet 0   TROKENDI XR 100 MG CP24 Take 1 capsule (100 mg total) by mouth at bedtime. Take with 200 mg capsule for total of 300 mg at bedtime. 90 capsule 6   TROKENDI XR 200 MG CP24 Take 1 capsule  (200 mg total) by mouth at bedtime. Take with Trokendi XR 100 mg for total of 300 mg daily at bedtime. 90 capsule 2   No facility-administered medications prior to visit.    PAST MEDICAL HISTORY: Past Medical History:  Diagnosis Date   Chronic kidney disease    stg III   Headache    Hypothyroidism    Morbid obesity (HCC)    OSA on CPAP    per pt, "has subsided", lost weight, has not been retested,   Other specified disorders of thyroid    Papilledema    Pneumonia    11/2021   Pseudotumor cerebri    Sickle cell trait (HCC)    Syncope and collapse 02/06/2016   Per pt, due to laughing, no issue recently    PAST SURGICAL HISTORY: Past Surgical History:  Procedure Laterality Date   CYST EXCISION  2001   Thyglossal duct   CYST EXCISION Right 12/02/2022   Procedure: EXCISION CYST RIGHT POSTERIOR SCALP;  Surgeon: Darnell Level, MD;  Location: [facility];  Service: General;  Laterality: Right;   FOOT SURGERY Right 02/09/2021   TONSILLECTOMY  1980    FAMILY HISTORY: Family History  Problem Relation Age of Onset   Hypertension Mother    Heart disease Mother    Kidney disease Mother    Hyperlipidemia Mother    Sleep apnea Mother    Obesity Mother    Diabetes Father    Hypertension Father    Hyperlipidemia Father    Heart disease Father    Kidney disease Father    Obesity Father     SOCIAL HISTORY: Social History   Socioeconomic History   Marital status: Married    Spouse name: Carl Knox   Number of children: 2   Years of education: Some grad school   Highest education level: Not on file  Occupational History   Occupation: Heavy Location manager    Comment: full time  Tobacco Use   Smoking status: Never    Passive exposure: Never   Smokeless tobacco: Never  Vaping Use   Vaping status: Never Used  Substance and Sexual Activity   Alcohol use: Yes    Alcohol/week: 0.0 standard drinks of alcohol    Comment: Occasional   Drug use: No   Sexual  activity: Not on file    Comment: Married  Other Topics Concern   Not on file  Social History Narrative   Lives at home w/ his wife   Left-handed   Caffeine: 2 cups daily   Social Drivers of Corporate investment banker Strain: Not on file  Food Insecurity: Not on file  Transportation Needs: Not on file  Physical Activity: Not on file  Stress: Not on file  Social Connections: Not on file  Intimate Partner Violence: Not on file   PHYSICAL EXAM  There were no vitals filed for this visit.  There is no height or  weight on file to calculate BMI.  Generalized: Well developed, in no acute distress  Neurological examination  Mentation: Alert oriented to time, place, history taking. Follows all commands speech and language fluent Cranial nerve II-XII: Pupils were equal round reactive to light. Extraocular movements were full, visual field were full on confrontational test. Facial sensation and strength were normal. Head turning and shoulder shrug were normal and symmetric. Motor: The motor testing reveals 5 over 5 strength of all 4 extremities. Good symmetric motor tone is noted throughout.  Sensory: Sensory testing is intact to soft touch on all 4 extremities. No evidence of extinction is noted.  Coordination: Cerebellar testing reveals good finger-nose-finger and heel-to-shin bilaterally.  Gait and station: Gait is normal.  Reflexes: Deep tendon reflexes are symmetric and normal bilaterally.   DIAGNOSTIC DATA (LABS, IMAGING, TESTING) - I reviewed patient records, labs, notes, testing and imaging myself where available.  Lab Results  Component Value Date   WBC 4.5 04/15/2020   HGB 14.2 04/15/2020   HCT 41.8 04/15/2020   MCV 77.6 (L) 04/15/2020   PLT 222 04/15/2020      Component Value Date/Time   NA 141 08/19/2020 0743   K 5.0 08/19/2020 0743   CL 109 (H) 08/19/2020 0743   CO2 21 08/19/2020 0743   GLUCOSE 85 08/19/2020 0743   BUN 26 (H) 08/19/2020 0743   CREATININE 1.34  (H) 08/19/2020 0743   CALCIUM 9.4 08/19/2020 0743   PROT 7.0 08/19/2020 0743   ALBUMIN 4.3 08/19/2020 0743   AST 26 08/19/2020 0743   ALT 19 08/19/2020 0743   ALKPHOS 97 08/19/2020 0743   BILITOT 0.4 08/19/2020 0743   GFRNONAA 64 12/24/2019 0911   GFRAA 74 12/24/2019 0911   Lab Results  Component Value Date   CHOL 110 08/19/2020   HDL 39 (L) 08/19/2020   LDLCALC 54 08/19/2020   TRIG 85 08/19/2020   Lab Results  Component Value Date   HGBA1C 5.1 08/19/2020   Lab Results  Component Value Date   VITAMINB12 1,067 04/26/2019   Lab Results  Component Value Date   TSH 2.250 08/19/2020    Margie Ege, AGNP-C, DNP 04/20/2023, 4:18 PM [facility] [address] [place], [place] [address] [phone]

## 2023-04-21 ENCOUNTER — Encounter: Payer: Self-pay | Admitting: Neurology

## 2023-04-21 ENCOUNTER — Ambulatory Visit: Payer: Commercial Managed Care - PPO | Admitting: Neurology

## 2023-04-21 VITALS — BP 118/68 | HR 70 | Ht 71.0 in | Wt 282.0 lb

## 2023-04-21 DIAGNOSIS — G932 Benign intracranial hypertension: Secondary | ICD-10-CM

## 2023-04-21 NOTE — Patient Instructions (Signed)
 For now, we will continue current dose of Trokendi XR 300 mg daily.  Keep follow-up appoint with ophthalmology next week.  Please have the notes sent over to our office for review.  Monitor for headache, vision change, other concerning symptom.  Follow-up in 6 months.

## 2023-04-28 DIAGNOSIS — H43813 Vitreous degeneration, bilateral: Secondary | ICD-10-CM | POA: Diagnosis not present

## 2023-04-28 DIAGNOSIS — H2513 Age-related nuclear cataract, bilateral: Secondary | ICD-10-CM | POA: Diagnosis not present

## 2023-04-28 DIAGNOSIS — G932 Benign intracranial hypertension: Secondary | ICD-10-CM | POA: Diagnosis not present

## 2023-05-31 ENCOUNTER — Encounter: Payer: Self-pay | Admitting: Neurology

## 2023-05-31 ENCOUNTER — Telehealth: Payer: Self-pay | Admitting: Neurology

## 2023-05-31 NOTE — Telephone Encounter (Signed)
 Carl Knox, looks like Carl Knox wanted notes from ophthalmology appt. Can you try to get this for Carl Knox to review? Thank you

## 2023-05-31 NOTE — Telephone Encounter (Signed)
 Wife has called to inform Isa Manuel, NP that Ophthalmologist is ok with reduction or whatever changes Sarah, NP makes ok the medication for pt

## 2023-06-03 ENCOUNTER — Other Ambulatory Visit (HOSPITAL_COMMUNITY): Payer: Self-pay

## 2023-06-03 ENCOUNTER — Other Ambulatory Visit: Payer: Self-pay | Admitting: Hematology & Oncology

## 2023-06-03 ENCOUNTER — Other Ambulatory Visit: Payer: Self-pay

## 2023-06-03 MED ORDER — FOLIC ACID 1 MG PO TABS
1.0000 mg | ORAL_TABLET | Freq: Every day | ORAL | 9 refills | Status: AC
  Filled 2023-06-03: qty 90, 90d supply, fill #0
  Filled 2023-09-02: qty 90, 90d supply, fill #1
  Filled 2023-12-08: qty 90, 90d supply, fill #2

## 2023-06-06 NOTE — Telephone Encounter (Signed)
 Eye records received and placed in Sarah NP's review box.

## 2023-06-07 NOTE — Telephone Encounter (Signed)
 Ophthalmology records from April 28, 2023 with Dr. Ambrosio Junker indicated no papilledema.  Recommended 6 to 8-week follow-up if medication change.

## 2023-06-14 ENCOUNTER — Other Ambulatory Visit (HOSPITAL_COMMUNITY): Payer: Self-pay

## 2023-06-14 MED ORDER — TROKENDI XR 50 MG PO CP24
50.0000 mg | ORAL_CAPSULE | Freq: Every day | ORAL | 5 refills | Status: DC
  Filled 2023-06-14 (×2): qty 30, 30d supply, fill #0
  Filled 2023-07-15 (×2): qty 30, 30d supply, fill #1
  Filled 2023-08-15: qty 30, 30d supply, fill #2
  Filled 2023-09-12: qty 30, 30d supply, fill #3
  Filled 2023-10-12: qty 30, 30d supply, fill #4

## 2023-06-14 NOTE — Telephone Encounter (Signed)
 Recent ophthalmology evaluation was normal without papilledema.  Currently taking Trokendi  XR 300 mg daily, will reduce to 250 mg daily.  Monitor for any recurrence of symptoms.

## 2023-06-14 NOTE — Addendum Note (Signed)
 Addended by: Wess Hammed on: 06/14/2023 01:24 PM   Modules accepted: Orders

## 2023-06-14 NOTE — Addendum Note (Signed)
 Addended by: Genora Kidd on: 06/14/2023 07:50 AM   Modules accepted: Orders

## 2023-06-15 ENCOUNTER — Other Ambulatory Visit (HOSPITAL_COMMUNITY): Payer: Self-pay

## 2023-07-15 ENCOUNTER — Encounter (HOSPITAL_COMMUNITY): Payer: Self-pay | Admitting: Pharmacist

## 2023-07-15 ENCOUNTER — Other Ambulatory Visit (HOSPITAL_COMMUNITY): Payer: Self-pay

## 2023-07-15 ENCOUNTER — Telehealth: Payer: Self-pay | Admitting: Neurology

## 2023-07-15 ENCOUNTER — Other Ambulatory Visit (HOSPITAL_BASED_OUTPATIENT_CLINIC_OR_DEPARTMENT_OTHER): Payer: Self-pay

## 2023-07-15 MED ORDER — TROKENDI XR 25 MG PO CP24
2.0000 | ORAL_CAPSULE | Freq: Every day | ORAL | 0 refills | Status: DC
  Filled 2023-07-15: qty 20, 10d supply, fill #0

## 2023-07-15 MED ORDER — TOPIRAMATE ER 25 MG PO CAP24
2.0000 | ORAL_CAPSULE | Freq: Every day | ORAL | 0 refills | Status: DC
  Filled 2023-07-15 (×4): qty 20, 10d supply, fill #0

## 2023-07-15 NOTE — Telephone Encounter (Signed)
 Per Jael, wife called back stating rx needed to be resent as DAW/brand. I resent rx as requested for this.

## 2023-07-15 NOTE — Telephone Encounter (Signed)
 Wife reports that pt is out of the TROKENDI  XR 50 MG CP24 , it has been confirmed that [facility] has 25mg  of the Trokendi  in stock, she is asking if some could be called in so pt is not without over the weekend, he has the 200mg , but out of 50, please call wife.  If there are samples in office she is willing to come for them

## 2023-07-15 NOTE — Addendum Note (Signed)
 Addended by: JOSHUA MAURILIO CROME on: 07/15/2023 09:48 AM   Modules accepted: Orders

## 2023-07-15 NOTE — Telephone Encounter (Signed)
 Pt last seen 04/21/23 by SS,NP. I checked and we do not have any samples of Trokendi  in office currently. Per notes:    I called wife. States [facility] has 20 caps in stock of Trokendi  XR 25mg . Rx sent in for this. Just needed enough to get through the weekend. She will call back before 12pm today if any issues picking up rx. Aware no samples available currently.

## 2023-07-18 ENCOUNTER — Other Ambulatory Visit (HOSPITAL_COMMUNITY): Payer: Self-pay

## 2023-08-15 ENCOUNTER — Other Ambulatory Visit (HOSPITAL_COMMUNITY): Payer: Self-pay

## 2023-08-15 DIAGNOSIS — G932 Benign intracranial hypertension: Secondary | ICD-10-CM | POA: Diagnosis not present

## 2023-08-16 ENCOUNTER — Other Ambulatory Visit (HOSPITAL_COMMUNITY): Payer: Self-pay

## 2023-08-23 ENCOUNTER — Telehealth: Payer: Self-pay | Admitting: Neurology

## 2023-08-23 NOTE — Telephone Encounter (Signed)
 Note from [facility] ophthalmology 08/15/2023 showing no papilledema.

## 2023-09-09 DIAGNOSIS — Z79899 Other long term (current) drug therapy: Secondary | ICD-10-CM | POA: Diagnosis not present

## 2023-09-09 DIAGNOSIS — G932 Benign intracranial hypertension: Secondary | ICD-10-CM | POA: Diagnosis not present

## 2023-09-09 DIAGNOSIS — E039 Hypothyroidism, unspecified: Secondary | ICD-10-CM | POA: Diagnosis not present

## 2023-09-09 LAB — LAB REPORT - SCANNED: EGFR: 69

## 2023-09-12 ENCOUNTER — Other Ambulatory Visit: Payer: Self-pay | Admitting: Neurology

## 2023-09-12 ENCOUNTER — Other Ambulatory Visit (HOSPITAL_COMMUNITY): Payer: Self-pay

## 2023-09-12 DIAGNOSIS — G932 Benign intracranial hypertension: Secondary | ICD-10-CM

## 2023-09-12 DIAGNOSIS — G43711 Chronic migraine without aura, intractable, with status migrainosus: Secondary | ICD-10-CM

## 2023-09-13 ENCOUNTER — Encounter (INDEPENDENT_AMBULATORY_CARE_PROVIDER_SITE_OTHER): Payer: Self-pay | Admitting: Otolaryngology

## 2023-09-13 ENCOUNTER — Other Ambulatory Visit: Payer: Self-pay

## 2023-09-13 ENCOUNTER — Ambulatory Visit (INDEPENDENT_AMBULATORY_CARE_PROVIDER_SITE_OTHER): Payer: Commercial Managed Care - PPO | Admitting: Otolaryngology

## 2023-09-13 VITALS — BP 110/71 | HR 68

## 2023-09-13 DIAGNOSIS — H903 Sensorineural hearing loss, bilateral: Secondary | ICD-10-CM | POA: Diagnosis not present

## 2023-09-13 DIAGNOSIS — H9313 Tinnitus, bilateral: Secondary | ICD-10-CM

## 2023-09-13 NOTE — Progress Notes (Unsigned)
 Patient ID: Carl Knox, male   DOB: February 10, 1965, 58 y.o.   MRN: [id number]  Follow-up: Bilateral tinnitus, hearing loss  HPI: The patient is a 58 year old male who returns today for his follow-up evaluation.  He was previously seen for bilateral tinnitus and bilateral high-frequency sensorineural hearing loss.  According to the patient, he was diagnosed with intracranial hypertension.  He was treated with lumbar puncture, with resolution of his tinnitus.  The patient returns today reporting no significant change in his hearing.  He currently denies any otalgia, otorrhea, or vertigo.   Objective Objective note General: Communicates without difficulty, well nourished, no acute distress. Head: Normocephalic, no evidence injury, no tenderness, facial buttresses intact without stepoff. Face/sinus: No tenderness to palpation and percussion. Facial movement is normal and symmetric. Eyes: PERRL, EOMI. No scleral icterus, conjunctivae clear. Neuro: CN II exam reveals vision grossly intact.  No nystagmus at any point of gaze. Ears: Auricles well formed without lesions.  Ear canals are intact without mass or lesion.  No erythema or edema is appreciated.  The TMs are intact without fluid. Nose: External evaluation reveals normal support and skin without lesions.  Dorsum is intact.  Anterior rhinoscopy reveals pink mucosa over anterior aspect of inferior turbinates and intact septum.  No purulence noted. Oral:  Oral cavity and oropharynx are intact, symmetric, without erythema or edema.  Mucosa is moist without lesions. Neck: Full range of motion without pain.  There is no significant lymphadenopathy.  No masses palpable.  Thyroid  bed within normal limits to palpation.  Parotid glands and submandibular glands equal bilaterally without mass.  Trachea is midline. Neuro:  CN 2-12 grossly intact. Gait normal.   Assessment 1.  Stable bilateral high frequency sensorineural hearing loss, likely secondary to presbycusis.  2.   The patient's ear canals, tympanic membranes, and middle ear spaces are all normal.  3.  His tinnitus has mostly resolved.  Plan  1.  The physical exam findings and the hearing test results are reviewed with the patient.  2.  The patient is a candidate for hearing amplification. The hearing aid options are discussed.  3.  The patient will return for re-evaluation in 1 year.

## 2023-09-14 ENCOUNTER — Encounter (HOSPITAL_COMMUNITY): Payer: Self-pay

## 2023-09-14 ENCOUNTER — Other Ambulatory Visit (HOSPITAL_COMMUNITY): Payer: Self-pay

## 2023-09-14 DIAGNOSIS — H9313 Tinnitus, bilateral: Secondary | ICD-10-CM | POA: Insufficient documentation

## 2023-09-14 DIAGNOSIS — H903 Sensorineural hearing loss, bilateral: Secondary | ICD-10-CM | POA: Insufficient documentation

## 2023-09-14 MED ORDER — TROKENDI XR 200 MG PO CP24
200.0000 mg | ORAL_CAPSULE | Freq: Every day | ORAL | 2 refills | Status: DC
  Filled 2023-09-14: qty 90, 90d supply, fill #0

## 2023-09-14 MED ORDER — TOPIRAMATE ER 100 MG PO CAP24
1.0000 | ORAL_CAPSULE | Freq: Every day | ORAL | 2 refills | Status: DC
  Filled 2023-09-14: qty 90, 90d supply, fill #0

## 2023-09-14 NOTE — Telephone Encounter (Signed)
 Received refill request for 200mg  trokendri xr however based on last not hsould be on 300. Pending orders for provider just to make sure

## 2023-09-15 ENCOUNTER — Other Ambulatory Visit (HOSPITAL_COMMUNITY): Payer: Self-pay

## 2023-10-12 ENCOUNTER — Other Ambulatory Visit (HOSPITAL_COMMUNITY): Payer: Self-pay

## 2023-10-13 ENCOUNTER — Other Ambulatory Visit (HOSPITAL_COMMUNITY): Payer: Self-pay

## 2023-10-24 ENCOUNTER — Ambulatory Visit: Admitting: Neurology

## 2023-10-28 DIAGNOSIS — E236 Other disorders of pituitary gland: Secondary | ICD-10-CM | POA: Diagnosis not present

## 2023-10-28 DIAGNOSIS — G4733 Obstructive sleep apnea (adult) (pediatric): Secondary | ICD-10-CM | POA: Diagnosis not present

## 2023-10-28 DIAGNOSIS — R6889 Other general symptoms and signs: Secondary | ICD-10-CM | POA: Diagnosis not present

## 2023-10-28 DIAGNOSIS — Z9889 Other specified postprocedural states: Secondary | ICD-10-CM | POA: Diagnosis not present

## 2023-10-28 DIAGNOSIS — E039 Hypothyroidism, unspecified: Secondary | ICD-10-CM | POA: Diagnosis not present

## 2023-10-28 DIAGNOSIS — E669 Obesity, unspecified: Secondary | ICD-10-CM | POA: Diagnosis not present

## 2023-11-03 ENCOUNTER — Ambulatory Visit: Admitting: Neurology

## 2023-11-03 ENCOUNTER — Encounter: Payer: Self-pay | Admitting: Neurology

## 2023-11-03 VITALS — BP 102/66 | HR 58 | Ht 71.0 in | Wt 284.8 lb

## 2023-11-03 DIAGNOSIS — G932 Benign intracranial hypertension: Secondary | ICD-10-CM

## 2023-11-03 DIAGNOSIS — G43711 Chronic migraine without aura, intractable, with status migrainosus: Secondary | ICD-10-CM | POA: Diagnosis not present

## 2023-11-03 DIAGNOSIS — Z8669 Personal history of other diseases of the nervous system and sense organs: Secondary | ICD-10-CM | POA: Diagnosis not present

## 2023-11-03 DIAGNOSIS — E669 Obesity, unspecified: Secondary | ICD-10-CM | POA: Diagnosis not present

## 2023-11-03 NOTE — Patient Instructions (Signed)
 It was nice to see you both today.  As discussed, we will reduce your Trokendi  to 200 mg once daily at this point, keep your appointment with Dr. Waylan as scheduled for November, see if they could see you more closer to end of November and first week of December potentially to see how things look with the reduced dose of your medication.  Follow-up to see Lauraine in about 6 months.  I would highly recommend that we reassess you for your sleep apnea and see if things have improved since your weight loss. Since you had a diagnosis of severe OSA, I would not recommend, that we will leave this question unresolved and get you reevaluated just to make sure you no longer have any significant degree of sleep apnea.  Good job with the weight loss, this may very well have resulted in improvement of your sleep apnea.  Untreated moderate to severe obstructive sleep apnea can increase your risk for heart attack and stroke and heart disease, irregular heartbeat but may also cause worsening symptoms of IIH and migraine headaches/headaches in general.  Let me know if you change your mind regarding testing.  You can call us  or send us  a message through MyChart as, I would be also happy to review your labs from your PCP which are not readily visible in epic for me but you can have them faxed over here or send us  an attachment in MyChart if you would like.

## 2023-11-03 NOTE — Progress Notes (Signed)
 Subjective:    Patient ID: Carl Knox is a 58 y.o. male.  HPI    Interim history:   Carl Knox is a 58 year old male with an underlying medical history of chronic kidney disease, prior diagnosis of sleep apnea, pseudotumor cerebri, sickle cell trait, history of syncope, hypothyroidism, history of pneumonia, and obesity, who presents for follow-up consultation of his pseudotumor cerebri.  The patient is unaccompanied today.  He was previously followed by Dr. Ines and also sees our nurse practitioner Lauraine Born in this clinic.  He was last seen by Lauraine Born, NP in April 2025, at which time he was doing well and his latest ophthalmology exam with Dr. Arvella Haddock had shown no significant papilledema in August 2024.  He had a follow-up ophthalmology exam pending at the time and the report was also benign as we received it.  I reviewed recent office visit notes and copied the notes below for reference.  Today, 11/03/2023: He reports he has been on Trokendi  XR 250 mg daily in 2 divided doses.  He tolerates this dose and has not noticed any repercussions after reducing it.  The first 2 weeks he had floaters and burst blood vessels in his conjunctiva but other than that he had no problems and latest eye examination was reassuring.  He is scheduled to see Dr. Haddock in November for reassessment once we go down on the ertapenem even further.  He has lost quite a bit of weight.  He no longer uses a PAP machine.  He was previously diagnosed with obstructive sleep apnea in 2018.  He was followed by Dr. Jenel at the time who referred him for a sleep study.  His split-night sleep study from 08/20/2016 showed severe obstructive sleep apnea.  He has not been seen in my sleep clinic since early 2021.  He reported at the time that he had not used his CPAP since June 2020 secondary to concern of spreading COVID. He feels that he sleeps well and wife reports that he no longer snores, nevertheless, he has not had a  reassessment of his sleep apnea in over 7 years and is strongly advised to consider formal reevaluation as we do not want to leave at least moderate if not severe obstructive sleep apnea untreated.  It is very possible that his sleep apnea has indeed improved with weight loss but I would like to evaluate him formally for this to give him reassurance and to clinically have reassurance going forward especially as we are planning to further reduce his Trokendi  and untreated obstructive sleep apnea may be a contributor to worsening IIH, worsening headaches and migraines, and increased cardiovascular risk in general which we discussed in detail also today.  He would like to think about it.  He is encouraged to call us  if he changes his mind regarding sleep testing.  We could do a home sleep test or to be more exact and reliably rule out sleep apnea we should think about bringing him in for sleep study again.  As far as his eyes, he has had no new symptoms other than what is described above, he is overall doing well, no new complaints today.  He would like to further reduce his Trokendi  and continue on this pathway.  His wife reports that he recently had blood work at [facility].  Those results are not available for my review, I would be happy to look at them if we get them faxed to us , I will wife can  also upload them in his MyChart if she would like.  She is going to look into getting those results to us .   The patient's allergies, current medications, family history, past medical history, past social history, past surgical history and problem list were reviewed and updated as appropriate.   Previously (copied from previous notes for reference):    04/21/23 (SS): << Weight is down 8 lbs. Remains on brand name Trokendi  XR 300 mg daily (200+100) since Nov 2023. Goes to eye doctor next week. Denies symptoms of sleep apnea. FMLA for office visits.  Denies headache, vision change, syncopal spell.  No ringing in the  ears.  PCP follows labs.  Continues to work full-time.  His wife uses a co-pay card for the Trokendi . >>   09/23/22 (SS): <<Here for follow-up accompanied by his wife. [facility]. Date of exam: 09/10/2022, no papilledema seen on exam follow up in 6 months. LP Sept 2023 opening pressure 32 cm water . On Trokendi  XR 300 mg daily (200+100), using copay card. No ringing in the ears. No near syncope spells. No headache at all. Is off CPAP, feels like sleeping well. Weight is same at 290 lbs. PCP follows labs.  Works full-time as a Chartered certified accountant.  No new issues or concerns. [name], RN spoke with patient about completing enrollment for Trokendi  co-pay card. I will send new script to pharmacy. >>   03/17/2022 (Dr. Ines): No papilledema, vision was great, following closely. Doing well on the Trokendi  300mg . Recently had blood work at [facility] and doing well nothing concerning it was his physical, Tinitus went away immediately after LP, hasn't come back now that he is on 300. Sleep apnea was very bad, no snoring, lost a lot of weight. No morning headaches. Off of cpap, doesn't want to retest bc symptoms went away after losing weight. Cannot tolerate Topiramate  IR cannot be tolerated at higher  doses. We will ask for a PA as well. We called the trokendi  hotline, found his copay card : ID [id number] grp [id number] bin [phone] pcn [id number]. Please also send us  a PA he cannot tolerate Topiramate  IR or ER generic. Dispense as written needs brand. We reordered with the copay card and also then called the pharmacy to run it. Also contacted the trokendi  rep. Got him the trokendi  until septamber when we will have to see if there is a new copay card (Please call [phone]. For questions about Trokendi  XR, including medication access and coverage, please call [facility] at [phone], Monday through Friday, 8:30 AM to 5:30 PM ET, excluding holidays.)   09/09/2021 (Dr. Ines): <<Follow up for IDIOPATHIC  INTRACRANIAL HYPERTENSION He is having ringing in his ears. He has to have a periodic hearing test. He had a hearing test at work and his hearing decreased. Ringing in the ear and thumping in the ear. He has water  in the ear with primary care but have an appointment an appointment with ENT tomorrow. He went to an allergist and is inflamed and seeing ENT tomorrow. If ENT can't address the ringing, and again it may just be hearing loss, would repeat MRi of the brain thin cuts through tthe IAC. And repeat LP. Seeing eye dotor in 6 months.    Patient complains of symptoms per HPI as well as the following symptoms: tinnitus . Pertinent negatives and positives per HPI. All others negative       03/10/2021 (SS): << Mr. Carl Knox here today for follow-up with history of pseudotumor cerebri with papilledema.  Remains on Trokendi  200 mg PM.  Has lumbar spine issues, MRI has shown potential for right L5 nerve root impingement. Managed through Dr. Duwayne and Dr. Bonner does injections with good benefit, last injection was September 05 2020, none needed since then. Denies any recent syncopal episodes. Left foot injury previously, had left 5th toe nail removed, podiatrist found right toe hammer toe, surgery Jan 2023, has done well. 8-10 lb weight loss in last year, total 35 lbs last 2 years. No syncopal spells since last saw Dr. Jenel. 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 In past tried to reduce the dose, stopped it for 1 year, then started having more syncopal spells, that has been indicator of high ICP. Creatinine was 1.36, recheck in 2 months. Here today with his wife, has 2 grown daughters. >>    09/04/2020 (Dr. Jenel): <<Mr. Frankowski is a 58 year old left-handed black male with a history of pseudotumor with papilledema.  He has been followed through his ophthalmologist, his most recent eye examination revealed resolution of the papilledema.  He has been on Trokendi  taking 200 mg in the evening.  He had a  lumbar puncture done in February 2022, opening pressure was 28 cm of water .  The patient unfortunately began having some right leg pain and back pain following the lumbar puncture.  MRI of the lumbar spine showed evidence of a disc bulge with a potential for right L5 nerve root impingement.  He has been followed through Dr. Duwayne, and he has had injections through Dr. Bonner.  Injections do help some, but the benefit lasts only about a month or so.  He notes that it is painful for him to sit, but he feels better when he is up and walking and feels better when he is lying down.  He is sleeping fairly well at night.  He is on gabapentin  300 mg twice daily.  He returns for an evaluation. >>   His Past Medical History Is Significant For: Past Medical History:  Diagnosis Date   Chronic kidney disease    stg III   Headache    Hypothyroidism    Morbid obesity (HCC)    OSA on CPAP    per pt, has subsided, lost weight, has not been retested,   Other specified disorders of thyroid     Papilledema    Pneumonia    11/2021   Pseudotumor cerebri    Sickle cell trait    Syncope and collapse 02/06/2016   Per pt, due to laughing, no issue recently    His Past Surgical History Is Significant For: Past Surgical History:  Procedure Laterality Date   CYST EXCISION  2001   Thyglossal duct   CYST EXCISION Right 12/02/2022   Procedure: EXCISION CYST RIGHT POSTERIOR SCALP;  Surgeon: Eletha Boas, MD;  Location: [facility];  Service: General;  Laterality: Right;   FOOT SURGERY Right 02/09/2021   TONSILLECTOMY  1980    His Family History Is Significant For: Family History  Problem Relation Age of Onset   Hypertension Mother    Heart disease Mother    Kidney disease Mother    Hyperlipidemia Mother    Sleep apnea Mother    Obesity Mother    Diabetes Father    Hypertension Father    Hyperlipidemia Father    Heart disease Father    Kidney disease Father    Obesity Father     His  Social History Is Significant For: Social  History   Socioeconomic History   Marital status: Married    Spouse name: Darlene   Number of children: 2   Years of education: Some grad school   Highest education level: Not on file  Occupational History   Occupation: Heavy Location manager    Comment: full time  Tobacco Use   Smoking status: Never    Passive exposure: Never   Smokeless tobacco: Never  Vaping Use   Vaping status: Never Used  Substance and Sexual Activity   Alcohol use: Yes    Alcohol/week: 0.0 standard drinks of alcohol    Comment: Occasional   Drug use: No   Sexual activity: Not on file    Comment: Married  Other Topics Concern   Not on file  Social History Narrative   Lives at home w/ his wife   Left-handed   Caffeine: 2 cups daily   Pt works    Social Drivers of Corporate investment banker Strain: Not on Ship broker Insecurity: Not on file  Transportation Needs: Not on file  Physical Activity: Not on file  Stress: Not on file  Social Connections: Not on file    His Allergies Are:  Allergies  Allergen Reactions   Valacyclovir Hcl Diarrhea  :   His Current Medications Are:  Outpatient Encounter Medications as of 11/03/2023  Medication Sig   folic acid  (FOLVITE ) 1 MG tablet Take 1 tablet (1 mg total) by mouth daily.   levothyroxine  (SYNTHROID ) 75 MCG tablet Take 1 tablet (75 mcg total) by mouth every morning on an empty stomach.   loratadine (CLARITIN) 10 MG tablet Take 10 mg by mouth daily.   Multiple Vitamin (MULTIVITAMIN) tablet Take 1 tablet by mouth daily.   mupirocin  ointment (BACTROBAN ) 2 % Apply 1 application topically 2 (two) times daily as needed for 7 to 10 days.   TROKENDI  XR 200 MG CP24 Take 1 capsule (200 mg total) by mouth at bedtime. Take with Trokendi  XR 100 mg for total of 300 mg daily at bedtime.   TROKENDI  XR 50 MG CP24 Take 1 capsule (50 mg total) by mouth daily. (To be taken with the 200 mg capsule for a total of 250 mg daily)    Topiramate  ER (TROKENDI  XR) 100 MG CP24 Take 1 capsule (100 mg total) by mouth daily.   traMADol  (ULTRAM ) 50 MG tablet Take 1 tablet (50 mg total) by mouth every 6 (six) hours as needed for moderate pain (pain score 4-6).   No facility-administered encounter medications on file as of 11/03/2023.  :  Review of Systems:  Out of a complete 14 point review of systems, all are reviewed and negative with the exception of these symptoms as listed below:  Review of Systems  Neurological:        Pt here for Pseudotumor cerebri f/u Pt states hasn't used cpap since 2020 Wife has questions about blood work results       Objective:  Neurological Exam  Physical Exam Physical Examination:   Vitals:   11/03/23 0750  BP: 102/66  Pulse: (!) 58    General Examination: The patient is a very pleasant 58 y.o. male in no acute distress. He appears well-developed and well-nourished and well groomed.   HEENT: Normocephalic, atraumatic, pupils are equal, round and reactive to light.  Corrective eyeglasses in place, pupils on the smaller side and funduscopic exam difficult but does not show any obvious abnormalities around the discs.  Hearing is grossly intact, extraocular movements  appear to be well preserved, corrective eyeglasses in place.  Airway examination reveals moderate mouth dryness, otherwise moderate to significantly crowded airway, tongue protrudes centrally, palate elevates symmetrically.      Chest: Clear to auscultation without wheezing, rhonchi or crackles noted.   Heart: S1+S2+0, regular and normal without murmurs, rubs or gallops noted.    Abdomen: Soft, non-tender and non-distended.   Extremities: There is no obvious edema.    Skin: Warm and dry without trophic changes noted.   Musculoskeletal: exam reveals no obvious joint deformities, tenderness or joint swelling or erythema.    Neurologically:  Mental status: The patient is awake, alert and oriented in all 4 spheres. His  immediate and remote memory, attention, language skills and fund of knowledge are appropriate. Speech is scant. Affect is blunted.  Cranial nerves II - XII are as described above under HEENT exam.  Motor exam: Normal bulk, moving all 4 extremities without restriction, no obvious resting or action tremor.    Fine motor and coordination: grossly intact.  Cerebellar testing: No dysmetria or intention tremor. There is no truncal or gait ataxia.  Gait, station and balance: He stands easily. No veering to one side is noted. No leaning to one side is noted. Posture is age-appropriate and stance is narrow based. Gait shows normal stride length and normal pace. No problems turning are noted.               Assessment and plan:  In summary, Carl Knox is a 58 year old male with an underlying medical history of chronic kidney disease, prior diagnosis of sleep apnea, pseudotumor cerebri, sickle cell trait, history of syncope, hypothyroidism, history of pneumonia, and obesity, who presents for follow-up consultation of his pseudotumor cerebri.  From the headache and vision standpoint he is doing well.  We are gradually trying to reduce the Trokendi , currently on 200+50 mg strength, we mutually agreed to reduce it today to 200 mg once daily.  He has an appointment with Dr. Waylan for a follow-up eye examination in November.  He is advised to keep this appointment or push it out just a little bit of possible to about 6 weeks after this dose reduction which was the plan per Dr. Waylan.  Exam is benign, symptoms are reassuring, he does not feel that he has any residual sleep related issues, nevertheless, to reliably rule out obstructive sleep apnea or significant sleep apnea at this time, he is strongly advised to consider a sleep study again for.  Untreated obstructive sleep apnea symptoms and long-term risks with moderate and severe obstructive sleep apnea when remained untreated were discussed in detail again today.  He  declines sleep testing at this time he is advised to follow-up routinely in this clinic to see Lauraine Born in about 6 months and keep us  posted via MyChart message, I can also review his labs from his PCP if they get scanned then or if they have PCP fax the labs to us .  In addition, if he were to change his mind regarding sleep testing he is advised to call us  or send us  a MyChart message.  I answered all their questions today and the patient and his wife were in agreement with our plan.   I spent 45 minutes in total face-to-face time and in reviewing records during pre-charting, more than 50% of which was spent in counseling and coordination of care, reviewing test results, reviewing medications and treatment regimen and/or in discussing or reviewing the diagnosis of IIH, OSA,  the prognosis and treatment options. Pertinent laboratory and imaging test results that were available during this visit with the patient were reviewed by me and considered in my medical decision making (see chart for details).

## 2023-11-10 ENCOUNTER — Other Ambulatory Visit (HOSPITAL_COMMUNITY): Payer: Self-pay

## 2023-12-05 DIAGNOSIS — G932 Benign intracranial hypertension: Secondary | ICD-10-CM | POA: Diagnosis not present

## 2023-12-05 DIAGNOSIS — H2513 Age-related nuclear cataract, bilateral: Secondary | ICD-10-CM | POA: Diagnosis not present

## 2023-12-07 ENCOUNTER — Telehealth: Payer: Self-pay | Admitting: *Deleted

## 2023-12-07 NOTE — Telephone Encounter (Signed)
 Noted update from ophthalmologist, Dr. Waylan.

## 2023-12-08 ENCOUNTER — Other Ambulatory Visit (HOSPITAL_COMMUNITY): Payer: Self-pay

## 2023-12-08 MED ORDER — LEVOTHYROXINE SODIUM 75 MCG PO TABS
75.0000 ug | ORAL_TABLET | Freq: Every morning | ORAL | 3 refills | Status: AC
  Filled 2023-12-08: qty 90, 90d supply, fill #0

## 2023-12-21 ENCOUNTER — Telehealth: Payer: Self-pay | Admitting: Neurology

## 2023-12-21 ENCOUNTER — Encounter: Payer: Self-pay | Admitting: Neurology

## 2023-12-21 NOTE — Telephone Encounter (Signed)
 Pt wife called stating that  Pt is having some side effect from Pt medication Topiramate  ER (TROKENDI  XR) 100 MG CP24  Pt wife stated that since dosage has changes  Pt has not been feeling well. Pt wife stated that Pt pain has increased  in right foot where toes are getting swollen , and every time he laughs to hard he get very light headed like he is about to fall out .  Pt and wife are concern and would like to speak to MD or Nurse about maybe changing dosage of medication or different treatment.

## 2023-12-21 NOTE — Telephone Encounter (Signed)
 error

## 2023-12-26 NOTE — Telephone Encounter (Signed)
 Symptoms that the wife has described are not likely linked to Trokendi .  Since he has several different symptoms I recommend he get checked out by his PCP or go to urgent care if he has acute symptoms or to the ER.

## 2023-12-27 NOTE — Telephone Encounter (Signed)
 Pt wife called to  request to speak to Nurse , Informed Nurse will call back

## 2023-12-27 NOTE — Telephone Encounter (Signed)
 I spoke with the patient's wife and I received a message from both Sarah and Dr. Buck.  The patient's wife thanked me for calling back.  She said that they were actually wondering if he needed to go back up on his Trokendi  to 250 mg instead of 200 mg. She states in the past when he was under the care of Dr Jenel, he was on a lower dose and at one point came off of the medication, he had the same symptoms. He had toe discoloration and swelling, dizzy/passing out feeling when laughing hard and he even got to the point where he had severe back pain and had to go to orthopedics.  Since his dose was increased his symptoms resolved.  Now that he is at a lower dose, he is beginning to complain of some of the same symptoms.  At rest he does not complain of headache or pressure.  His back has bothered him off and on since lowering the dose.  She did agree to have primary care evaluate his foot.  She said is not his like that is fine but just the second toe on the right foot that has darkened and become swollen.

## 2023-12-27 NOTE — Telephone Encounter (Signed)
 Like I said, these unusual symptoms and I have never seen or heard of anything like this is related to the Trokendi  at any dose.  I think it would be very important to get checked out for the symptoms by PCP or urgent care.  As long as his eye exam is fine, we can continue to maintain Trokendi  at 200 mg once daily for now.

## 2023-12-27 NOTE — Telephone Encounter (Signed)
 I called the pt's wife back and relayed the message below in its entirety from Dr Buck. Her questions were answered. I encouraged her to make the appt with pcp to have his symptoms evaluated. For now maintain dose of Trokendi  at 200 mg daily. They can send their note over to Dr Buck. In previous call I had told wife to proceed to urgent care or ER if any acute / severe symptoms/concerns. She thanked me for the call and said she would follow back up with us .

## 2023-12-28 NOTE — Addendum Note (Signed)
 Addended by: HILLIARD HEATHER CROME on: 12/28/2023 03:15 PM   Modules accepted: Orders

## 2023-12-28 NOTE — Telephone Encounter (Signed)
 I talked to Dr Buck. Will evaluate further by ordering repeat LP as last was in 2023. Spoke with wife. She is in agreement and will watch for a call from GI to schedule. For no, leave Trokendi  XR dose at 200 mg.   LP order placed per v.o. Dr Buck.

## 2023-12-28 NOTE — Telephone Encounter (Signed)
 I called the pt's wife back. She wanted to check back again regarding the pt's symptoms. She is going to have podiatry/pcp look at his foot. However she is concerned that his Trokendi  XR decrease is related to his return of feelings of syncope with forceful laughing. She stated this happened in the past when patient was seeing Dr Jenel. In 2023 he had decreased hearing which was improved after LP. I informed her I would run this by Dr Buck.

## 2024-01-05 ENCOUNTER — Other Ambulatory Visit (HOSPITAL_COMMUNITY): Payer: Self-pay

## 2024-01-05 NOTE — Discharge Instructions (Signed)

## 2024-01-06 ENCOUNTER — Inpatient Hospital Stay
Admission: RE | Admit: 2024-01-06 | Discharge: 2024-01-06 | Disposition: A | Source: Ambulatory Visit | Attending: Neurology | Admitting: Neurology

## 2024-01-06 VITALS — BP 112/63 | HR 51

## 2024-01-06 DIAGNOSIS — G932 Benign intracranial hypertension: Secondary | ICD-10-CM | POA: Diagnosis not present

## 2024-01-06 LAB — PROTEIN, CSF: Total Protein, CSF: 55 mg/dL — ABNORMAL HIGH (ref 15–45)

## 2024-01-06 LAB — CSF CELL COUNT WITH DIFFERENTIAL
RBC Count, CSF: 0 {cells}/uL
TOTAL NUCLEATED CELL: 0 {cells}/uL (ref 0–5)

## 2024-01-06 LAB — GLUCOSE, CSF: Glucose, CSF: 62 mg/dL (ref 40–80)

## 2024-01-09 ENCOUNTER — Inpatient Hospital Stay: Admission: RE | Admit: 2024-01-09

## 2024-01-09 ENCOUNTER — Ambulatory Visit: Payer: Self-pay | Admitting: Neurology

## 2024-01-10 ENCOUNTER — Other Ambulatory Visit: Payer: Self-pay | Admitting: *Deleted

## 2024-01-10 ENCOUNTER — Other Ambulatory Visit (HOSPITAL_COMMUNITY): Payer: Self-pay

## 2024-01-10 DIAGNOSIS — G932 Benign intracranial hypertension: Secondary | ICD-10-CM

## 2024-01-10 DIAGNOSIS — G43711 Chronic migraine without aura, intractable, with status migrainosus: Secondary | ICD-10-CM

## 2024-01-10 MED ORDER — TROKENDI XR 200 MG PO CP24
200.0000 mg | ORAL_CAPSULE | Freq: Every day | ORAL | 5 refills | Status: AC
  Filled 2024-01-10: qty 30, 30d supply, fill #0
  Filled 2024-02-08: qty 90, 90d supply, fill #1
  Filled 2024-02-10: qty 90, 90d supply, fill #0

## 2024-01-10 MED ORDER — TOPIRAMATE ER 50 MG PO CAP24
50.0000 mg | ORAL_CAPSULE | Freq: Every day | ORAL | 5 refills | Status: AC
  Filled 2024-01-10: qty 30, 30d supply, fill #0
  Filled 2024-02-08: qty 90, 90d supply, fill #1
  Filled 2024-02-10: qty 90, 90d supply, fill #0

## 2024-01-10 NOTE — Telephone Encounter (Signed)
 We will increase Trokendi  to 250 mg daily total dose at patient's request. Most recent notes from Dr. Johnella office indicate no evidence of papilledema thankfully.

## 2024-01-10 NOTE — Telephone Encounter (Signed)
 I called wife of pt.  She and pt are asking to increase the trokendi  to 250mg  po daily as what he was taking previously which seemed to control his symptoms better.  They would need script if so.

## 2024-01-10 NOTE — Telephone Encounter (Signed)
 Pt wife caled to request  for MD to increase medication dosage for Pt due to LP being elevated    TROKENDI  XR 200 MG CP24  Pt wife request a call back just to go over why  the increase is needed  Callback number is  [phone]

## 2024-01-10 NOTE — Telephone Encounter (Signed)
 I relayed to wife of pt.  That Dr. Buck did make change to take trokendi  250mg  total daily. Wife stated needed new script for 200mg  caps.  I redid with new dose change. 30 days 5 refills. And relayed per Dr. Waylan no papilledema.

## 2024-01-10 NOTE — Addendum Note (Signed)
 Addended by: Kathey Simer on: 01/10/2024 12:17 PM   Modules accepted: Orders

## 2024-01-11 ENCOUNTER — Other Ambulatory Visit (HOSPITAL_COMMUNITY): Payer: Self-pay

## 2024-01-13 ENCOUNTER — Other Ambulatory Visit (HOSPITAL_COMMUNITY): Payer: Self-pay

## 2024-02-08 ENCOUNTER — Other Ambulatory Visit (HOSPITAL_COMMUNITY): Payer: Self-pay

## 2024-02-09 ENCOUNTER — Other Ambulatory Visit: Payer: Self-pay

## 2024-02-09 ENCOUNTER — Other Ambulatory Visit (HOSPITAL_COMMUNITY): Payer: Self-pay

## 2024-02-10 ENCOUNTER — Other Ambulatory Visit (HOSPITAL_COMMUNITY): Payer: Self-pay

## 2024-07-05 ENCOUNTER — Ambulatory Visit: Admitting: Neurology
